# Patient Record
Sex: Female | Born: 1940
Health system: Southern US, Community
[De-identification: ages and names within clinical notes are randomized; demographics above are authoritative.]

## PROBLEM LIST (undated history)

## (undated) DIAGNOSIS — F0390 Unspecified dementia without behavioral disturbance: Secondary | ICD-10-CM

## (undated) DIAGNOSIS — I639 Cerebral infarction, unspecified: Secondary | ICD-10-CM

## (undated) DIAGNOSIS — I10 Essential (primary) hypertension: Secondary | ICD-10-CM

## (undated) DIAGNOSIS — M316 Other giant cell arteritis: Secondary | ICD-10-CM

## (undated) HISTORY — DX: Cerebral infarction, unspecified: I63.9

## (undated) HISTORY — PX: NO PAST SURGERIES: SHX2092

---

## 1999-04-25 ENCOUNTER — Encounter: Payer: Self-pay | Admitting: Family Medicine

## 1999-04-25 ENCOUNTER — Ambulatory Visit (HOSPITAL_COMMUNITY): Admission: RE | Admit: 1999-04-25 | Discharge: 1999-04-25 | Payer: Self-pay | Admitting: Family Medicine

## 2000-03-09 ENCOUNTER — Encounter: Payer: Self-pay | Admitting: Emergency Medicine

## 2000-03-09 ENCOUNTER — Emergency Department (HOSPITAL_COMMUNITY): Admission: EM | Admit: 2000-03-09 | Discharge: 2000-03-09 | Payer: Self-pay | Admitting: Emergency Medicine

## 2000-05-13 ENCOUNTER — Other Ambulatory Visit: Admission: RE | Admit: 2000-05-13 | Discharge: 2000-05-13 | Payer: Self-pay | Admitting: Family Medicine

## 2001-03-17 ENCOUNTER — Ambulatory Visit (HOSPITAL_COMMUNITY): Admission: RE | Admit: 2001-03-17 | Discharge: 2001-03-17 | Payer: Self-pay | Admitting: Family Medicine

## 2001-03-17 ENCOUNTER — Encounter: Payer: Self-pay | Admitting: Family Medicine

## 2001-04-26 ENCOUNTER — Ambulatory Visit (HOSPITAL_COMMUNITY): Admission: RE | Admit: 2001-04-26 | Discharge: 2001-04-26 | Payer: Self-pay | Admitting: Family Medicine

## 2001-04-26 ENCOUNTER — Encounter: Payer: Self-pay | Admitting: Family Medicine

## 2001-07-26 ENCOUNTER — Encounter: Payer: Self-pay | Admitting: Family Medicine

## 2001-07-26 ENCOUNTER — Ambulatory Visit (HOSPITAL_COMMUNITY): Admission: RE | Admit: 2001-07-26 | Discharge: 2001-07-26 | Payer: Self-pay | Admitting: Family Medicine

## 2001-09-15 ENCOUNTER — Ambulatory Visit (HOSPITAL_COMMUNITY): Admission: RE | Admit: 2001-09-15 | Discharge: 2001-09-15 | Payer: Self-pay | Admitting: Family Medicine

## 2001-09-15 ENCOUNTER — Encounter: Payer: Self-pay | Admitting: Family Medicine

## 2001-09-26 ENCOUNTER — Emergency Department (HOSPITAL_COMMUNITY): Admission: EM | Admit: 2001-09-26 | Discharge: 2001-09-27 | Payer: Self-pay | Admitting: Emergency Medicine

## 2001-10-10 ENCOUNTER — Emergency Department (HOSPITAL_COMMUNITY): Admission: EM | Admit: 2001-10-10 | Discharge: 2001-10-10 | Payer: Self-pay | Admitting: Emergency Medicine

## 2001-10-10 ENCOUNTER — Encounter: Payer: Self-pay | Admitting: Emergency Medicine

## 2001-10-17 ENCOUNTER — Emergency Department (HOSPITAL_COMMUNITY): Admission: EM | Admit: 2001-10-17 | Discharge: 2001-10-17 | Payer: Self-pay | Admitting: *Deleted

## 2002-02-02 ENCOUNTER — Ambulatory Visit (HOSPITAL_COMMUNITY): Admission: RE | Admit: 2002-02-02 | Discharge: 2002-02-02 | Payer: Self-pay | Admitting: Family Medicine

## 2002-02-02 ENCOUNTER — Encounter: Payer: Self-pay | Admitting: Family Medicine

## 2002-03-22 ENCOUNTER — Inpatient Hospital Stay (HOSPITAL_COMMUNITY): Admission: AD | Admit: 2002-03-22 | Discharge: 2002-03-22 | Payer: Self-pay | Admitting: Obstetrics and Gynecology

## 2002-03-31 ENCOUNTER — Ambulatory Visit (HOSPITAL_COMMUNITY): Admission: RE | Admit: 2002-03-31 | Discharge: 2002-03-31 | Payer: Self-pay | Admitting: Family Medicine

## 2002-03-31 ENCOUNTER — Encounter: Payer: Self-pay | Admitting: Family Medicine

## 2003-05-22 ENCOUNTER — Encounter (HOSPITAL_BASED_OUTPATIENT_CLINIC_OR_DEPARTMENT_OTHER): Admission: RE | Admit: 2003-05-22 | Discharge: 2003-08-20 | Payer: Self-pay | Admitting: Internal Medicine

## 2003-05-23 ENCOUNTER — Encounter: Payer: Self-pay | Admitting: Family Medicine

## 2003-05-23 ENCOUNTER — Ambulatory Visit (HOSPITAL_COMMUNITY): Admission: RE | Admit: 2003-05-23 | Discharge: 2003-05-23 | Payer: Self-pay | Admitting: Family Medicine

## 2003-10-23 ENCOUNTER — Emergency Department (HOSPITAL_COMMUNITY): Admission: EM | Admit: 2003-10-23 | Discharge: 2003-10-24 | Payer: Self-pay | Admitting: Family Medicine

## 2003-11-24 ENCOUNTER — Emergency Department (HOSPITAL_COMMUNITY): Admission: EM | Admit: 2003-11-24 | Discharge: 2003-11-24 | Payer: Self-pay | Admitting: Emergency Medicine

## 2004-01-24 ENCOUNTER — Other Ambulatory Visit: Admission: RE | Admit: 2004-01-24 | Discharge: 2004-01-24 | Payer: Self-pay | Admitting: Gynecology

## 2004-02-09 ENCOUNTER — Emergency Department (HOSPITAL_COMMUNITY): Admission: EM | Admit: 2004-02-09 | Discharge: 2004-02-09 | Payer: Self-pay | Admitting: Emergency Medicine

## 2004-05-31 ENCOUNTER — Emergency Department (HOSPITAL_COMMUNITY): Admission: EM | Admit: 2004-05-31 | Discharge: 2004-06-01 | Payer: Self-pay | Admitting: Emergency Medicine

## 2004-06-05 ENCOUNTER — Ambulatory Visit (HOSPITAL_COMMUNITY): Admission: RE | Admit: 2004-06-05 | Discharge: 2004-06-05 | Payer: Self-pay | Admitting: Anesthesiology

## 2004-07-01 ENCOUNTER — Emergency Department (HOSPITAL_COMMUNITY): Admission: EM | Admit: 2004-07-01 | Discharge: 2004-07-01 | Payer: Self-pay | Admitting: Emergency Medicine

## 2005-01-26 ENCOUNTER — Other Ambulatory Visit: Admission: RE | Admit: 2005-01-26 | Discharge: 2005-01-26 | Payer: Self-pay | Admitting: Gynecology

## 2005-09-05 ENCOUNTER — Emergency Department (HOSPITAL_COMMUNITY): Admission: EM | Admit: 2005-09-05 | Discharge: 2005-09-06 | Payer: Self-pay | Admitting: Emergency Medicine

## 2005-10-05 ENCOUNTER — Emergency Department (HOSPITAL_COMMUNITY): Admission: EM | Admit: 2005-10-05 | Discharge: 2005-10-05 | Payer: Self-pay | Admitting: Emergency Medicine

## 2006-01-07 ENCOUNTER — Ambulatory Visit (HOSPITAL_COMMUNITY): Admission: RE | Admit: 2006-01-07 | Discharge: 2006-01-07 | Payer: Self-pay | Admitting: *Deleted

## 2006-07-06 ENCOUNTER — Other Ambulatory Visit: Admission: RE | Admit: 2006-07-06 | Discharge: 2006-07-06 | Payer: Self-pay | Admitting: Gynecology

## 2006-07-13 ENCOUNTER — Encounter: Admission: RE | Admit: 2006-07-13 | Discharge: 2006-07-13 | Payer: Self-pay | Admitting: Endocrinology

## 2007-07-14 ENCOUNTER — Other Ambulatory Visit: Admission: RE | Admit: 2007-07-14 | Discharge: 2007-07-14 | Payer: Self-pay | Admitting: Gynecology

## 2007-11-13 ENCOUNTER — Emergency Department (HOSPITAL_COMMUNITY): Admission: EM | Admit: 2007-11-13 | Discharge: 2007-11-13 | Payer: Self-pay | Admitting: Emergency Medicine

## 2008-02-27 ENCOUNTER — Other Ambulatory Visit: Admission: RE | Admit: 2008-02-27 | Discharge: 2008-02-27 | Payer: Self-pay | Admitting: Endocrinology

## 2008-12-25 ENCOUNTER — Emergency Department (HOSPITAL_COMMUNITY): Admission: EM | Admit: 2008-12-25 | Discharge: 2008-12-25 | Payer: Self-pay | Admitting: Emergency Medicine

## 2009-03-04 ENCOUNTER — Emergency Department (HOSPITAL_COMMUNITY): Admission: EM | Admit: 2009-03-04 | Discharge: 2009-03-04 | Payer: Self-pay | Admitting: Emergency Medicine

## 2009-04-02 ENCOUNTER — Other Ambulatory Visit: Admission: RE | Admit: 2009-04-02 | Discharge: 2009-04-02 | Payer: Self-pay | Admitting: Endocrinology

## 2009-06-04 ENCOUNTER — Emergency Department (HOSPITAL_COMMUNITY): Admission: EM | Admit: 2009-06-04 | Discharge: 2009-06-04 | Payer: Self-pay | Admitting: Emergency Medicine

## 2009-07-14 ENCOUNTER — Emergency Department (HOSPITAL_COMMUNITY): Admission: EM | Admit: 2009-07-14 | Discharge: 2009-07-14 | Payer: Self-pay | Admitting: Emergency Medicine

## 2009-10-17 ENCOUNTER — Ambulatory Visit: Payer: Self-pay | Admitting: Gastroenterology

## 2009-11-25 ENCOUNTER — Encounter: Admission: RE | Admit: 2009-11-25 | Discharge: 2009-11-25 | Payer: Self-pay | Admitting: Gastroenterology

## 2010-01-14 ENCOUNTER — Emergency Department (HOSPITAL_COMMUNITY): Admission: EM | Admit: 2010-01-14 | Discharge: 2010-01-14 | Payer: Self-pay | Admitting: Emergency Medicine

## 2010-03-18 ENCOUNTER — Encounter: Admission: RE | Admit: 2010-03-18 | Discharge: 2010-03-18 | Payer: Self-pay | Admitting: Family Medicine

## 2010-07-10 ENCOUNTER — Ambulatory Visit: Payer: Self-pay | Admitting: Family Medicine

## 2010-07-10 ENCOUNTER — Encounter (INDEPENDENT_AMBULATORY_CARE_PROVIDER_SITE_OTHER): Payer: Self-pay | Admitting: Family Medicine

## 2010-07-10 LAB — CONVERTED CEMR LAB
Albumin: 3.6 g/dL (ref 3.5–5.2)
CO2: 28 meq/L (ref 19–32)
Cholesterol: 131 mg/dL (ref 0–200)
Eosinophils Relative: 1 % (ref 0–5)
Glucose, Bld: 83 mg/dL (ref 70–99)
HCT: 37 % (ref 36.0–46.0)
Lymphocytes Relative: 28 % (ref 12–46)
Lymphs Abs: 1.2 10*3/uL (ref 0.7–4.0)
Monocytes Relative: 14 % — ABNORMAL HIGH (ref 3–12)
Neutrophils Relative %: 57 % (ref 43–77)
Potassium: 3.8 meq/L (ref 3.5–5.3)
RBC: 4.07 M/uL (ref 3.87–5.11)
Sodium: 144 meq/L (ref 135–145)
Total Protein: 6.8 g/dL (ref 6.0–8.3)
Triglycerides: 49 mg/dL (ref <150)
WBC: 4.2 10*3/uL (ref 4.0–10.5)

## 2010-07-14 ENCOUNTER — Encounter
Admission: RE | Admit: 2010-07-14 | Discharge: 2010-07-14 | Payer: Self-pay | Source: Home / Self Care | Admitting: Internal Medicine

## 2010-07-16 ENCOUNTER — Ambulatory Visit: Payer: Self-pay | Admitting: Family Medicine

## 2010-07-24 ENCOUNTER — Ambulatory Visit: Payer: Self-pay | Admitting: Internal Medicine

## 2010-07-29 ENCOUNTER — Ambulatory Visit: Payer: Self-pay | Admitting: Family Medicine

## 2010-08-15 ENCOUNTER — Ambulatory Visit: Payer: Self-pay | Admitting: Internal Medicine

## 2010-11-26 ENCOUNTER — Observation Stay (HOSPITAL_COMMUNITY)
Admission: EM | Admit: 2010-11-26 | Discharge: 2010-11-27 | Payer: Self-pay | Source: Home / Self Care | Attending: Internal Medicine | Admitting: Internal Medicine

## 2011-02-16 LAB — DIFFERENTIAL
Eosinophils Absolute: 0 10*3/uL (ref 0.0–0.7)
Eosinophils Relative: 0 % (ref 0–5)
Lymphs Abs: 0.9 10*3/uL (ref 0.7–4.0)

## 2011-02-16 LAB — CBC
MCH: 29.6 pg (ref 26.0–34.0)
MCV: 90.9 fL (ref 78.0–100.0)
Platelets: 150 10*3/uL (ref 150–400)
Platelets: 161 10*3/uL (ref 150–400)
RBC: 4.06 MIL/uL (ref 3.87–5.11)
RDW: 14.9 % (ref 11.5–15.5)
WBC: 4.2 10*3/uL (ref 4.0–10.5)

## 2011-02-16 LAB — URINALYSIS, ROUTINE W REFLEX MICROSCOPIC
Nitrite: NEGATIVE
Specific Gravity, Urine: 1.013 (ref 1.005–1.030)
pH: 7.5 (ref 5.0–8.0)

## 2011-02-16 LAB — BASIC METABOLIC PANEL
BUN: 14 mg/dL (ref 6–23)
BUN: 15 mg/dL (ref 6–23)
CO2: 30 mEq/L (ref 19–32)
Calcium: 8.7 mg/dL (ref 8.4–10.5)
Chloride: 107 mEq/L (ref 96–112)
Creatinine, Ser: 0.61 mg/dL (ref 0.4–1.2)
Creatinine, Ser: 0.65 mg/dL (ref 0.4–1.2)
GFR calc Af Amer: >60 mL/min (ref >60)
GFR calc non Af Amer: >60 mL/min (ref >60)

## 2011-02-16 LAB — URINE CULTURE: Culture  Setup Time: 201112211452

## 2011-02-16 LAB — GLUCOSE, CAPILLARY
Glucose-Capillary: 108 mg/dL — ABNORMAL HIGH (ref 70–99)
Glucose-Capillary: 121 mg/dL — ABNORMAL HIGH (ref 70–99)
Glucose-Capillary: 150 mg/dL — ABNORMAL HIGH (ref 70–99)
Glucose-Capillary: 160 mg/dL — ABNORMAL HIGH (ref 70–99)
Glucose-Capillary: 88 mg/dL (ref 70–99)

## 2011-02-16 LAB — POCT CARDIAC MARKERS

## 2011-02-16 LAB — HEMOGLOBIN A1C: Mean Plasma Glucose: 146 mg/dL — ABNORMAL HIGH (ref <117)

## 2011-02-16 LAB — LIPID PANEL: Cholesterol: 150 mg/dL (ref 0–200)

## 2011-02-25 LAB — CBC
Hemoglobin: 12.8 g/dL (ref 12.0–15.0)
MCHC: 34.3 g/dL (ref 30.0–36.0)
MCV: 90.3 fL (ref 78.0–100.0)
RDW: 14.4 % (ref 11.5–15.5)

## 2011-02-25 LAB — COMPREHENSIVE METABOLIC PANEL
ALT: 22 U/L (ref 0–35)
CO2: 27 mEq/L (ref 19–32)
Calcium: 8.9 mg/dL (ref 8.4–10.5)
Creatinine, Ser: 0.61 mg/dL (ref 0.4–1.2)
GFR calc non Af Amer: >60 mL/min (ref >60)
Glucose, Bld: 114 mg/dL — ABNORMAL HIGH (ref 70–99)
Sodium: 139 mEq/L (ref 135–145)
Total Protein: 7.6 g/dL (ref 6.0–8.3)

## 2011-02-25 LAB — URINALYSIS, ROUTINE W REFLEX MICROSCOPIC
Bilirubin Urine: NEGATIVE
Hgb urine dipstick: NEGATIVE
Ketones, ur: NEGATIVE mg/dL
Nitrite: NEGATIVE
Urobilinogen, UA: 1 mg/dL (ref 0.0–1.0)
pH: 7 (ref 5.0–8.0)

## 2011-02-25 LAB — DIFFERENTIAL
Lymphocytes Relative: 16 % (ref 12–46)
Lymphs Abs: 0.9 10*3/uL (ref 0.7–4.0)
Monocytes Relative: 10 % (ref 3–12)
Neutro Abs: 4.3 10*3/uL (ref 1.7–7.7)
Neutrophils Relative %: 74 % (ref 43–77)

## 2011-03-14 LAB — BASIC METABOLIC PANEL
BUN: 11 mg/dL (ref 6–23)
CO2: 33 mEq/L — ABNORMAL HIGH (ref 19–32)
Calcium: 9 mg/dL (ref 8.4–10.5)
Chloride: 102 mEq/L (ref 96–112)
Creatinine, Ser: 0.78 mg/dL (ref 0.4–1.2)
GFR calc Af Amer: >60 mL/min (ref >60)
GFR calc non Af Amer: >60 mL/min (ref >60)
Glucose, Bld: 240 mg/dL — ABNORMAL HIGH (ref 70–99)
Potassium: 3.5 mEq/L (ref 3.5–5.1)
Sodium: 141 mEq/L (ref 135–145)

## 2011-03-14 LAB — GLUCOSE, CAPILLARY: Glucose-Capillary: 338 mg/dL — ABNORMAL HIGH (ref 70–99)

## 2011-03-16 LAB — GLUCOSE, CAPILLARY: Glucose-Capillary: 135 mg/dL — ABNORMAL HIGH (ref 70–99)

## 2011-03-19 LAB — GLUCOSE, CAPILLARY: Glucose-Capillary: 162 mg/dL — ABNORMAL HIGH (ref 70–99)

## 2011-03-23 LAB — CBC
HCT: 36.4 % (ref 36.0–46.0)
Hemoglobin: 12.2 g/dL (ref 12.0–15.0)
MCHC: 33.6 g/dL (ref 30.0–36.0)
MCV: 87.4 fL (ref 78.0–100.0)
Platelets: 186 10*3/uL (ref 150–400)
RDW: 14.6 % (ref 11.5–15.5)

## 2011-03-23 LAB — BASIC METABOLIC PANEL
BUN: 18 mg/dL (ref 6–23)
CO2: 28 mEq/L (ref 19–32)
Chloride: 101 mEq/L (ref 96–112)
Glucose, Bld: 121 mg/dL — ABNORMAL HIGH (ref 70–99)
Potassium: 3.9 mEq/L (ref 3.5–5.1)
Sodium: 138 mEq/L (ref 135–145)

## 2011-03-23 LAB — DIFFERENTIAL
Basophils Absolute: 0 10*3/uL (ref 0.0–0.1)
Eosinophils Absolute: 0.1 10*3/uL (ref 0.0–0.7)
Eosinophils Relative: 1 % (ref 0–5)
Monocytes Absolute: 0.9 10*3/uL (ref 0.1–1.0)

## 2011-03-23 LAB — SEDIMENTATION RATE: Sed Rate: 55 mm/hr — ABNORMAL HIGH (ref 0–22)

## 2011-04-24 NOTE — Consult Note (Signed)
NAME:  Diana Hartman, Diana Hartman                         ACCOUNT NO.:  [id number]   MEDICAL RECORD NO.:  [id number]                   PATIENT TYPE:  REC   LOCATION:  FOOT                                 FACILITY:  [facility]   PHYSICIAN:  Jonelle Sports. Sevier, M.D.              DATE OF BIRTH:  1941/05/18   DATE OF CONSULTATION:  05/24/2003  DATE OF DISCHARGE:                                   CONSULTATION   HISTORY:  This 70 year old black female is referred through the courtesy of  Dr. Alwyn Ren for management of hammertoe deformities with callus formation of  both feet.   The patient has been diagnosed with diabetes for some 20 years with last  hemoglobin A1C some five months ago of 9.5%.  There is some confusion as to  her present insulin dose as to whether it is 70/30 insulin as reflected in  the chart or whether it is simply Novolin NPH.  It is taken on a single  daily basis along with glipizide 20 mg per day.  The patient monitors very  infrequently, and her blood sugars are variable.   Although the patient does not have significant hallux valgus she has  developed a hammertoe deformity of her second toes bilaterally, such that  these toes now overlap the hallices and actually point down on the hallux.  As a result, she has developed calluses on the dorsal aspect of the proximal  interphalangeal joints of both second toes.  She also has a callus on the  plantar aspect of the right fourth toe.  She is referred now for our  evaluation and advice regarding these lesions.   PAST MEDICAL HISTORY:  Notable for:  1. Degenerative arthritis.  2. Degenerative disk disease.  3. Hypertension.  4. Anxiety.   ALLERGIES:  She has no known medicinal allergies.   MEDICATIONS:  Her regular medications include:  1. Insulin as previously indicated with some confusion.  2. Glipizide 20 mg p.o. each morning.  3. Amitriptyline 25 mg nightly at bedtime.  4. Enteric-coated aspirin 325 mg daily.  5. Prednisone 5  mg daily.  6. Zoloft 50 mg daily.  7. Covera 180 mg nightly at bedtime.  8. Prazosin 1 mg nightly at bedtime.  9. Flexeril 10 mg nightly at bedtime.  10.      Prinzide 10/12.5 1 daily.  11.      Zyrtec 10 mg daily.   EXAMINATION:  Examination today is limited to the distal lower extremities.  The patient has grossly deformed feet due to the striking hammertoe  configuration of the second toes bilaterally, with these then overlapping  the hallices.  Skin temperatures are high normal and equal in both feet.  All pulses are palpable and quite adequate.  There is hair growth on the  toes.  Monofilament testing shows the preservation of protective sensation  in both feet.  There is significant callus formation on  the dorsal aspect of  both second toes at the proximal interphalangeal joint area, and there is a  heavy callus formation of the plantar aspect of the fourth toe on the right.  There are lesser calluses at the interphalangeal joint area of both hallices  on their medial margins and light callus at both heels.   DISPOSITION:  1. The patient is given instruction regarding foot care and diabetes by     video with nurse and physician reinforcement.  2. It is advised to the patient that it is urgent that she bring her     diabetes under better control, and she is today given the recommendation     to make sure that she has 70/30 insulin and to take that 20 units daily     before her morning meal and 30 units nightly before the evening meal,     which is her largest meal of the day.  She is to continue her glipizide.     She is to contact Dr. Audria Nine for ongoing tightening of her diabetes     management.  3. The calluses at all of the aforementioned areas, namely both heels, the     plantar aspect of the right fourth toe, medially on both hallices, and on     the dorsal aspect of both second toes at their proximal interphalangeal     joint areas, are sharply pared without incident.   4. The patient is given the name of Scientist, product/process development on [employer] and is     advised to go there to obtain extra-depth shoes with standard non-custom     protected inserted.  5. Follow-up visit will be here in three months.                                               Jonelle Sports. Cheryll Cockayne, M.D.    RES/MEDQ  D:  05/24/2003  T:  05/24/2003  Job:  [id number]   cc:   Titus Dubin. Alwyn Ren, M.D. [facility]   Maurice March, M.D.  [address] [place]  [place] [address]  Fax: [phone]

## 2011-04-24 NOTE — Op Note (Signed)
Diana Hartman, Diana Hartman               ACCOUNT NO.:  [id number]   MEDICAL RECORD NO.:  [id number]          PATIENT TYPE:  AMB   LOCATION:  ENDO                         FACILITY:  [facility]   PHYSICIAN:  Georgiana Spinner, M.D.    DATE OF BIRTH:  July 28, 1941   DATE OF PROCEDURE:  DATE OF DISCHARGE:                                 OPERATIVE REPORT   PROCEDURE:  Colonoscopy.   INDICATION:  Hemoccult positivity.   ANESTHESIA:  Demerol 80 mg, Versed 8 mg.   DESCRIPTION OF PROCEDURE:  With the patient mildly sedated in the left  lateral decubitus position, the Olympus videoscopic colonoscope was inserted  in the rectum and passed carefully through a sigmoid area that may have been  in an abdominal wall hernia but with pressure applied and the patient rolled  to her back we were to reach the cecum identified by the ileocecal valve and  appendiceal orifice both of which were photographed.  From this point the  colonoscope was slowly withdrawn, taking circumferential views of the  colonic mucosa and stopping then only when we reached the rectum which  appeared normal on direct and showed hemorrhoids on retroflexed view.  The  endoscope was straightened and withdrawn.  The patient's vital signs and  pulse oximeter remained stable.  The patient tolerated the procedure well  without apparent complications.   FINDINGS:  Diverticulosis of sigmoid colon with probable abdominal wall  hernia sac and internal hemorrhoids were noted which may be the cause of the  patient's hemoccult positivity.   PLAN:  Have the patient follow up with me on an as-needed basis.           ______________________________  Georgiana Spinner, M.D.     GMO/MEDQ  D:  01/07/2006  T:  01/07/2006  Job:  [id number]

## 2011-09-14 LAB — CBC
Hemoglobin: 10.9 — ABNORMAL LOW
MCHC: 34.3
Platelets: 203
RDW: 14.6

## 2011-09-14 LAB — DIFFERENTIAL
Basophils Absolute: 0.1
Basophils Relative: 1
Eosinophils Relative: 1
Lymphocytes Relative: 22
Monocytes Absolute: 0.9
Neutro Abs: 4.2

## 2011-12-03 ENCOUNTER — Emergency Department (HOSPITAL_COMMUNITY)
Admission: EM | Admit: 2011-12-03 | Discharge: 2011-12-04 | Disposition: A | Discharge status: Home / Self Care | Payer: Medicare Other | Attending: Emergency Medicine | Admitting: Emergency Medicine

## 2011-12-03 ENCOUNTER — Encounter: Payer: Self-pay | Admitting: Emergency Medicine

## 2011-12-03 DIAGNOSIS — R609 Edema, unspecified: Secondary | ICD-10-CM

## 2011-12-03 DIAGNOSIS — IMO0002 Reserved for concepts with insufficient information to code with codable children: Secondary | ICD-10-CM | POA: Insufficient documentation

## 2011-12-03 DIAGNOSIS — E119 Type 2 diabetes mellitus without complications: Secondary | ICD-10-CM | POA: Insufficient documentation

## 2011-12-03 DIAGNOSIS — I10 Essential (primary) hypertension: Secondary | ICD-10-CM | POA: Insufficient documentation

## 2011-12-03 DIAGNOSIS — Z79899 Other long term (current) drug therapy: Secondary | ICD-10-CM | POA: Insufficient documentation

## 2011-12-03 HISTORY — DX: Essential (primary) hypertension: I10

## 2011-12-03 NOTE — ED Notes (Signed)
Pt alert, nad, presents to ED, c/o lower ext swelling and pain, onset was today per family, pt +3 pitting edema to lower ext, resp even unlabored, skin pwd, denies chest pain or sob

## 2011-12-04 ENCOUNTER — Other Ambulatory Visit: Payer: Self-pay

## 2011-12-04 ENCOUNTER — Emergency Department (HOSPITAL_COMMUNITY): Payer: Medicare Other

## 2011-12-04 LAB — POCT I-STAT TROPONIN I: Troponin i, poc: 0.01 ng/mL (ref 0.00–0.08)

## 2011-12-04 LAB — DIFFERENTIAL
Basophils Absolute: 0 10*3/uL (ref 0.0–0.1)
Basophils Relative: 0 % (ref 0–1)
Lymphocytes Relative: 22 % (ref 12–46)
Neutro Abs: 3.5 10*3/uL (ref 1.7–7.7)
Neutrophils Relative %: 63 % (ref 43–77)

## 2011-12-04 LAB — CBC
HCT: 33.7 % — ABNORMAL LOW (ref 36.0–46.0)
Hemoglobin: 11.4 g/dL — ABNORMAL LOW (ref 12.0–15.0)
MCHC: 33.8 g/dL (ref 30.0–36.0)
RDW: 14.2 % (ref 11.5–15.5)
WBC: 5.6 10*3/uL (ref 4.0–10.5)

## 2011-12-04 LAB — D-DIMER, QUANTITATIVE: D-Dimer, Quant: 0.44 ug/mL-FEU (ref 0.00–0.48)

## 2011-12-04 LAB — POCT I-STAT, CHEM 8
BUN: 11 mg/dL (ref 6–23)
Chloride: 104 mEq/L (ref 96–112)
HCT: 36 % (ref 36.0–46.0)
Potassium: 3.5 mEq/L (ref 3.5–5.1)

## 2011-12-04 NOTE — ED Provider Notes (Signed)
History     CSN: [id number]  Arrival date & time 12/03/11  2320   First MD Initiated Contact with Patient 12/04/11 0040      Chief Complaint  Patient presents with  . Leg Swelling  . Leg Pain    (Consider location/radiation/quality/duration/timing/severity/associated sxs/prior treatment) The history is provided by the patient and a relative. No language interpreter was used.  Patient presents with 24 hours of B lower extremity edema.  No long car trips or plane trips.  No redness, warmth or induration.  No trauma.  Symmetric.  No CP, no SOB no DOE.  Edema is pitting and below the level of the knees B.  No f/c/r.    Past Medical History  Diagnosis Date  . Diabetes mellitus   . Hypertension     History reviewed. No pertinent past surgical history.  No family history on file.  History  Substance Use Topics  . Smoking status: Not on file  . Smokeless tobacco: Not on file  . Alcohol Use:     OB History    Grav Para Term Preterm Abortions TAB SAB Ect Mult Living                  Review of Systems  Constitutional: Negative for activity change.  Eyes: Negative for discharge.  Respiratory: Negative for cough, chest tightness and shortness of breath.   Cardiovascular: Negative for chest pain.  Gastrointestinal: Negative for abdominal distention.  Genitourinary: Negative for difficulty urinating.  Musculoskeletal: Negative for arthralgias.  Neurological: Negative for dizziness.  Hematological: Negative.   Psychiatric/Behavioral: Negative.     Allergies  Review of patient's allergies indicates no known allergies.  Home Medications   Current Outpatient Rx  Name Route Sig Dispense Refill  . DILTIAZEM HCL ER 180 MG PO CP24 Oral Take 180 mg by mouth daily.      Marland Kitchen GLIPIZIDE ER 5 MG PO TB24 Oral Take 5 mg by mouth daily.      . INSULIN DETEMIR 100 UNIT/ML Steger SOLN Subcutaneous Inject 10 Units into the skin daily.     Marland Kitchen LISINOPRIL 40 MG PO TABS Oral Take 40 mg by mouth  daily.      Marland Kitchen PREDNISONE 10 MG PO TABS Oral Take 10 mg by mouth daily.      . SERTRALINE HCL 50 MG PO TABS Oral Take 50 mg by mouth daily.        BP 150/67  Pulse 65  Temp(Src) 98 F (36.7 C) (Oral)  Resp 16  Wt 140 lb (63.504 kg)  SpO2 100%  Physical Exam  Constitutional: She appears well-developed and well-nourished.  HENT:  Head: Normocephalic and atraumatic.  Eyes: EOM are normal. Pupils are equal, round, and reactive to light.  Neck: Normal range of motion.  Cardiovascular: Normal rate and regular rhythm.   Pulmonary/Chest: Effort normal and breath sounds normal. She has no wheezes. She has no rales.  Abdominal: Soft. Bowel sounds are normal.  Musculoskeletal: Normal range of motion. She exhibits edema.  Neurological: She is alert. She has normal reflexes.  Skin: Skin is warm and dry.  Psychiatric: Thought content normal.    ED Course  Procedures (including critical care time)  Labs Reviewed  CBC - Abnormal; Notable for the following:    RBC 3.83 (*)    Hemoglobin 11.4 (*)    HCT 33.7 (*)    Platelets 146 (*)    All other components within normal limits  DIFFERENTIAL - Abnormal; Notable for  the following:    Monocytes Relative 14 (*)    All other components within normal limits  POCT I-STAT, CHEM 8 - Abnormal; Notable for the following:    Glucose, Bld 331 (*)    All other components within normal limits  POCT I-STAT TROPONIN I  D-DIMER, QUANTITATIVE  I-STAT, CHEM 8  I-STAT TROPONIN I   Dg Chest 2 View  12/04/2011  *RADIOLOGY REPORT*  Clinical Data: Leg swelling  CHEST - 2 VIEW  Comparison: 11/26/2010  Findings: The heart size and mediastinal contours are within normal limits.  Both lungs are clear.  The visualized skeletal structures are unremarkable.  IMPRESSION: No active cardiopulmonary abnormalities.  Original Report Authenticated By: Rosealee Albee, M.D.     1. Peripheral edema       MDM   Date: 12/04/2011  Rate: 60  Rhythm: normal sinus  rhythm  QRS Axis: normal  Intervals: normal  ST/T Wave abnormalities: normal  Conduction Disutrbances:none  Narrative Interpretation:   Old EKG Reviewed: none available        Giovanni Biby K Patricia Perales-Rasch, MD 12/04/11 [id number]

## 2011-12-04 NOTE — ED Notes (Signed)
Patient discharged with family.

## 2011-12-04 NOTE — ED Notes (Signed)
Patient states some tingling noted in the right lower extremity.

## 2012-02-03 ENCOUNTER — Ambulatory Visit
Admission: RE | Admit: 2012-02-03 | Discharge: 2012-02-03 | Disposition: A | Discharge status: Home / Self Care | Payer: Medicare Other | Source: Ambulatory Visit | Attending: Endocrinology | Admitting: Endocrinology

## 2012-02-03 ENCOUNTER — Other Ambulatory Visit: Payer: Self-pay | Admitting: Endocrinology

## 2012-02-03 DIAGNOSIS — M542 Cervicalgia: Secondary | ICD-10-CM

## 2012-03-28 ENCOUNTER — Other Ambulatory Visit: Payer: Self-pay | Admitting: Diagnostic Neuroimaging

## 2012-03-28 DIAGNOSIS — R531 Weakness: Secondary | ICD-10-CM

## 2012-03-28 DIAGNOSIS — R413 Other amnesia: Secondary | ICD-10-CM

## 2012-04-18 ENCOUNTER — Ambulatory Visit
Admission: RE | Admit: 2012-04-18 | Discharge: 2012-04-18 | Disposition: A | Discharge status: Home / Self Care | Payer: Medicare Other | Source: Ambulatory Visit | Attending: Diagnostic Neuroimaging | Admitting: Diagnostic Neuroimaging

## 2012-04-18 DIAGNOSIS — R531 Weakness: Secondary | ICD-10-CM

## 2012-04-18 DIAGNOSIS — R413 Other amnesia: Secondary | ICD-10-CM

## 2014-03-29 ENCOUNTER — Encounter (HOSPITAL_COMMUNITY): Payer: Self-pay | Admitting: Emergency Medicine

## 2014-03-29 ENCOUNTER — Emergency Department (HOSPITAL_COMMUNITY)
Admission: EM | Admit: 2014-03-29 | Discharge: 2014-03-29 | Disposition: A | Discharge status: Home / Self Care | Payer: Medicare Other | Attending: Emergency Medicine | Admitting: Emergency Medicine

## 2014-03-29 DIAGNOSIS — Z794 Long term (current) use of insulin: Secondary | ICD-10-CM | POA: Insufficient documentation

## 2014-03-29 DIAGNOSIS — R5381 Other malaise: Secondary | ICD-10-CM | POA: Insufficient documentation

## 2014-03-29 DIAGNOSIS — I10 Essential (primary) hypertension: Secondary | ICD-10-CM | POA: Insufficient documentation

## 2014-03-29 DIAGNOSIS — IMO0002 Reserved for concepts with insufficient information to code with codable children: Secondary | ICD-10-CM | POA: Insufficient documentation

## 2014-03-29 DIAGNOSIS — R5383 Other fatigue: Secondary | ICD-10-CM

## 2014-03-29 DIAGNOSIS — Z79899 Other long term (current) drug therapy: Secondary | ICD-10-CM | POA: Insufficient documentation

## 2014-03-29 DIAGNOSIS — F0390 Unspecified dementia without behavioral disturbance: Secondary | ICD-10-CM | POA: Insufficient documentation

## 2014-03-29 DIAGNOSIS — R159 Full incontinence of feces: Secondary | ICD-10-CM

## 2014-03-29 DIAGNOSIS — E162 Hypoglycemia, unspecified: Secondary | ICD-10-CM

## 2014-03-29 DIAGNOSIS — E1169 Type 2 diabetes mellitus with other specified complication: Secondary | ICD-10-CM | POA: Insufficient documentation

## 2014-03-29 HISTORY — DX: Unspecified dementia, unspecified severity, without behavioral disturbance, psychotic disturbance, mood disturbance, and anxiety: F03.90

## 2014-03-29 HISTORY — DX: Other giant cell arteritis: M31.6

## 2014-03-29 LAB — CBG MONITORING, ED
GLUCOSE-CAPILLARY: 39 mg/dL — AB (ref 70–99)
GLUCOSE-CAPILLARY: 71 mg/dL (ref 70–99)
GLUCOSE-CAPILLARY: 123 mg/dL — AB (ref 70–99)
GLUCOSE-CAPILLARY: 84 mg/dL (ref 70–99)
GLUCOSE-CAPILLARY: 118 mg/dL — AB (ref 70–99)
Glucose-Capillary: 107 mg/dL — ABNORMAL HIGH (ref 70–99)

## 2014-03-29 LAB — COMPREHENSIVE METABOLIC PANEL
ALBUMIN: 3.3 g/dL — AB (ref 3.5–5.2)
ALT: 12 U/L (ref 0–35)
AST: 27 U/L (ref 0–37)
Alkaline Phosphatase: 103 U/L (ref 39–117)
BUN: 18 mg/dL (ref 6–23)
CALCIUM: 9.7 mg/dL (ref 8.4–10.5)
CO2: 24 mEq/L (ref 19–32)
Chloride: 102 mEq/L (ref 96–112)
Creatinine, Ser: 0.6 mg/dL (ref 0.50–1.10)
GFR calc non Af Amer: 88 mL/min — ABNORMAL LOW (ref >90)
GLUCOSE: 124 mg/dL — AB (ref 70–99)
Potassium: 3.2 mEq/L — ABNORMAL LOW (ref 3.7–5.3)
SODIUM: 140 meq/L (ref 137–147)
TOTAL PROTEIN: 7.5 g/dL (ref 6.0–8.3)
Total Bilirubin: 0.5 mg/dL (ref 0.3–1.2)

## 2014-03-29 LAB — CBC WITH DIFFERENTIAL/PLATELET
BASOS PCT: 0 % (ref 0–1)
Basophils Absolute: 0 10*3/uL (ref 0.0–0.1)
EOS ABS: 0 10*3/uL (ref 0.0–0.7)
EOS PCT: 0 % (ref 0–5)
HCT: 39.2 % (ref 36.0–46.0)
Hemoglobin: 12.8 g/dL (ref 12.0–15.0)
LYMPHS ABS: 1 10*3/uL (ref 0.7–4.0)
Lymphocytes Relative: 11 % — ABNORMAL LOW (ref 12–46)
MCH: 28.5 pg (ref 26.0–34.0)
MCHC: 32.7 g/dL (ref 30.0–36.0)
MCV: 87.3 fL (ref 78.0–100.0)
Monocytes Absolute: 1.1 10*3/uL — ABNORMAL HIGH (ref 0.1–1.0)
Monocytes Relative: 12 % (ref 3–12)
NEUTROS PCT: 75 % (ref 43–77)
Neutro Abs: 6.9 10*3/uL (ref 1.7–7.7)
PLATELETS: 141 10*3/uL — AB (ref 150–400)
RBC: 4.49 MIL/uL (ref 3.87–5.11)
RDW: 13.3 % (ref 11.5–15.5)
WBC: 9.2 10*3/uL (ref 4.0–10.5)

## 2014-03-29 MED ORDER — DEXTROSE 50 % IV SOLN
1.0000 | Freq: Once | INTRAVENOUS | Status: AC
  Administered 2014-03-29: 50 mL via INTRAVENOUS
  Filled 2014-03-29: qty 50

## 2014-03-29 MED ORDER — POTASSIUM CHLORIDE CRYS ER 20 MEQ PO TBCR
40.0000 meq | EXTENDED_RELEASE_TABLET | Freq: Once | ORAL | Status: DC
  Filled 2014-03-29: qty 2

## 2014-03-29 MED ORDER — DEXTROSE 50 % IV SOLN: 50.0000 mL | Freq: Once | INTRAVENOUS | Status: DC

## 2014-03-29 NOTE — ED Notes (Signed)
Pt had liquid stool which got into hat for urine collection.  Urine specimen unable to use. will have to try again later for another clean specimen.

## 2014-03-29 NOTE — ED Notes (Signed)
Bed: WA02 Expected date:  Expected time:  Means of arrival:  Comments: hypoglycemia

## 2014-03-29 NOTE — ED Provider Notes (Signed)
CSN: [id number]     Arrival date & time 03/29/14  1216 History   First MD Initiated Contact with Patient 03/29/14 1339     Chief Complaint  Patient presents with  . Hypoglycemia     (Consider location/radiation/quality/duration/timing/severity/associated sxs/prior Treatment) HPI Comments: Pt is a 73 y.o. female with Pmhx as above who presents with several episodes of fecal incontinence over past 2-3 weeks. She had an episode today, felt too weak to get up and EMS called. FSBG at the time 29. D50 given. Upon arrrival , 39.  D50, given and repeated at 123. Pt currently has no complaints, denies, fever, chills, n/v, d/a, urinary symptoms, back pain. She is at baseline mental status, no focal neuro findings.  Patient is a 73 y.o. female presenting with hypoglycemia. The history is provided by the patient and a relative. The history is limited by the condition of the patient. No language interpreter was used.  Hypoglycemia Initial blood sugar:  29 Blood sugar after intervention:  123 Severity:  Severe Onset quality:  Unable to specify Timing:  Unable to specify Progression:  Improving Chronicity:  Recurrent Diabetic status:  Controlled with insulin and controlled with oral medications Current diabetic therapy:  Insulin, metformin, glipizide Time since last antidiabetic medication: yesterday. Context: decreased oral intake (has not eaten today)   Relieved by:  IV glucose and eating Ineffective treatments:  None tried Associated symptoms: weakness   Associated symptoms: no shortness of breath, no sweats and no vomiting     Past Medical History  Diagnosis Date  . Diabetes mellitus   . Hypertension   . Temporal arteritis   . Dementia    History reviewed. No pertinent past surgical history. No family history on file. History  Substance Use Topics  . Smoking status: Never Smoker   . Smokeless tobacco: Not on file  . Alcohol Use: No   OB History   Grav Para Term Preterm Abortions  TAB SAB Ect Mult Living                 Review of Systems  Constitutional: Negative for fever, chills, diaphoresis, activity change, appetite change and fatigue.  HENT: Negative for congestion, facial swelling, rhinorrhea and sore throat.   Eyes: Negative for photophobia and discharge.  Respiratory: Negative for cough, chest tightness and shortness of breath.   Cardiovascular: Negative for chest pain, palpitations and leg swelling.  Gastrointestinal: Negative for nausea, vomiting, abdominal pain and diarrhea.  Endocrine: Negative for polydipsia and polyuria.  Genitourinary: Negative for dysuria, frequency, difficulty urinating and pelvic pain.  Musculoskeletal: Negative for arthralgias, back pain, neck pain and neck stiffness.  Skin: Negative for color change and wound.  Allergic/Immunologic: Negative for immunocompromised state.  Neurological: Positive for weakness. Negative for facial asymmetry, numbness and headaches.  Hematological: Does not bruise/bleed easily.  Psychiatric/Behavioral: Negative for confusion and agitation.      Allergies  Review of patient's allergies indicates no known allergies.  Home Medications   Prior to Admission medications   Medication Sig Start Date End Date Taking? Authorizing Provider  diltiazem (DILACOR XR) 180 MG 24 hr capsule Take 180 mg by mouth daily.     Yes Historical Provider, MD  donepezil (ARICEPT) 10 MG tablet Take 10 mg by mouth daily.   Yes Historical Provider, MD  glipiZIDE (GLUCOTROL XL) 5 MG 24 hr tablet Take 5 mg by mouth daily.     Yes Historical Provider, MD  insulin detemir (LEVEMIR) 100 UNIT/ML injection Inject 20 Units into  the skin 2 (two) times daily.    Yes Historical Provider, MD  lisinopril (PRINIVIL,ZESTRIL) 40 MG tablet Take 40 mg by mouth daily.     Yes Historical Provider, MD  metFORMIN (GLUCOPHAGE) 1000 MG tablet Take 1,000 mg by mouth daily with breakfast.   Yes Historical Provider, MD  nebivolol (BYSTOLIC) 5 MG  tablet Take 5 mg by mouth daily.   Yes Historical Provider, MD  prednisoLONE acetate (PRED FORTE) 1 % ophthalmic suspension Place 1 drop into both eyes 4 (four) times daily.   Yes Historical Provider, MD  predniSONE (DELTASONE) 10 MG tablet Take 10 mg by mouth daily.     Yes Historical Provider, MD  sertraline (ZOLOFT) 50 MG tablet Take 50 mg by mouth daily.     Yes Historical Provider, MD  zolpidem (AMBIEN) 5 MG tablet Take 5 mg by mouth at bedtime as needed for sleep.   Yes Historical Provider, MD   BP 160/75  Pulse 68  Temp(Src) 97.9 F (36.6 C) (Oral)  Resp 16  SpO2 98% Physical Exam  Constitutional: She is oriented to person, place, and time. She appears well-developed and well-nourished. No distress.  HENT:  Head: Normocephalic and atraumatic.  Mouth/Throat: No oropharyngeal exudate.  Eyes: Pupils are equal, round, and reactive to light.  Neck: Normal range of motion. Neck supple.  Cardiovascular: Normal rate, regular rhythm and normal heart sounds.  Exam reveals no gallop and no friction rub.   No murmur heard. Pulmonary/Chest: Effort normal and breath sounds normal. No respiratory distress. She has no wheezes. She has no rales.  Abdominal: Soft. Bowel sounds are normal. She exhibits no distension and no mass. There is no tenderness. There is no rebound and no guarding.  Musculoskeletal: Normal range of motion. She exhibits no edema and no tenderness.  Neurological: She is alert and oriented to person, place, and time. She has normal strength. No cranial nerve deficit or sensory deficit. She exhibits normal muscle tone. She displays a negative Romberg sign. GCS eye subscore is 4. GCS verbal subscore is 4. GCS motor subscore is 6.  Skin: Skin is warm and dry.  Psychiatric: She has a normal mood and affect.    ED Course  Procedures (including critical care time) Labs Review Labs Reviewed  CBC WITH DIFFERENTIAL - Abnormal; Notable for the following:    Platelets 141 (*)     Lymphocytes Relative 11 (*)    Monocytes Absolute 1.1 (*)    All other components within normal limits  COMPREHENSIVE METABOLIC PANEL - Abnormal; Notable for the following:    Potassium 3.2 (*)    Glucose, Bld 124 (*)    Albumin 3.3 (*)    GFR calc non Af Amer 88 (*)    All other components within normal limits  CBG MONITORING, ED - Abnormal; Notable for the following:    Glucose-Capillary 39 (*)    All other components within normal limits  CBG MONITORING, ED - Abnormal; Notable for the following:    Glucose-Capillary 123 (*)    All other components within normal limits  CBG MONITORING, ED - Abnormal; Notable for the following:    Glucose-Capillary 107 (*)    All other components within normal limits  URINALYSIS, ROUTINE W REFLEX MICROSCOPIC  CBG MONITORING, ED  CBG MONITORING, ED    Imaging Review No results found.   EKG Interpretation None      MDM   Final diagnoses:  Fecal incontinence  Hypoglycemia    Pt is a  73 y.o. female with Pmhx as above who presents with several episodes of fecal incontinence over past 2-3 weeks. She had an episode today, felt too weak to get up and EMS called. FSBG at the time 29. D50 given. Upon arrrival , 39.  D50, given and repeated at 123. Pt currently has no complaints, denies, fever, chills, n/v, d/a, urinary symptoms, back pain. She is at baseline mental status, no focal neuro findings.  CBC, CMP grossly unremarkable except for mild hypokalemia. Pt given mixed carb meal, and glu will be repeated in dept by Dr. Criss AlvineGoldston. If low, plan for admission for observation. I do not have clear cause for fecal incontinence but doubt acute infection or spinal cord pathology.         Shanna CiscoMegan E Docherty, MD 03/29/14 1640

## 2014-03-29 NOTE — Discharge Instructions (Signed)
Continue to check your sugar to make sure it is not dropping. Do not take any of your diabetic medicines tonight. Be sure to check your sugar first thing in the morning contact her primary doctor to get back on a schedule of your medicines.   Hypoglycemia (Low Blood Sugar) Hypoglycemia is when the glucose (sugar) in your blood is too low. Hypoglycemia can happen for many reasons. It can happen to people with or without diabetes. Hypoglycemia can develop quickly and can be a medical emergency.  CAUSES  Having hypoglycemia does not mean that you will develop diabetes. Different causes include:  Missed or delayed meals or not enough carbohydrates eaten.  Medication overdose. This could be by accident or deliberate. If by accident, your medication may need to be adjusted or changed.  Exercise or increased activity without adjustments in carbohydrates or medications.  A nerve disorder that affects body functions like your heart rate, blood pressure and digestion (autonomic neuropathy).  A condition where the stomach muscles do not function properly (gastroparesis). Therefore, medications may not absorb properly.  The inability to recognize the signs of hypoglycemia (hypoglycemic unawareness).  Absorption of insulin  may be altered.  Alcohol consumption.  Pregnancy/menstrual cycles/postpartum. This may be due to hormones.  Certain kinds of tumors. This is very rare. SYMPTOMS   Sweating.  Hunger.  Dizziness.  Blurred vision.  Drowsiness.  Weakness.  Headache.  Rapid heart beat.  Shakiness.  Nervousness. DIAGNOSIS  Diagnosis is made by monitoring blood glucose in one or all of the following ways:  Fingerstick blood glucose monitoring.  Laboratory results. TREATMENT  If you think your blood glucose is low:  Check your blood glucose, if possible. If it is less than 70 mg/dl, take one of the following:  3-4 glucose tablets.   cup juice (prefer clear like  apple).   cup "regular" soda pop.  1 cup milk.  -1 tube of glucose gel.  5-6 hard candies.  Do not over treat because your blood glucose (sugar) will only go too high.  Wait 15 minutes and recheck your blood glucose. If it is still less than 70 mg/dl (or below your target range), repeat treatment.  Eat a snack if it is more than one hour until your next meal. Sometimes, your blood glucose may go so low that you are unable to treat yourself. You may need someone to help you. You may even pass out or be unable to swallow. This may require you to get an injection of glucagon, which raises the blood glucose. HOME CARE INSTRUCTIONS  Check blood glucose as recommended by your caregiver.  Take medication as prescribed by your caregiver.  Follow your meal plan. Do not skip meals. Eat on time.  If you are going to drink alcohol, drink it only with meals.  Check your blood glucose before driving.  Check your blood glucose before and after exercise. If you exercise longer or different than usual, be sure to check blood glucose more frequently.  Always carry treatment with you. Glucose tablets are the easiest to carry.  Always wear medical alert jewelry or carry some form of identification that states that you have diabetes. This will alert people that you have diabetes. If you have hypoglycemia, they will have a better idea on what to do. SEEK MEDICAL CARE IF:   You are having problems keeping your blood sugar at target range.  You are having frequent episodes of hypoglycemia.  You feel you might be having side effects  from your medicines.  You have symptoms of an illness that is not improving after 3-4 days.  You notice a change in vision or a new problem with your vision. SEEK IMMEDIATE MEDICAL CARE IF:   You are a family member or friend of a person whose blood glucose goes below 70 mg/dl and is accompanied by:  Confusion.  A change in mental status.  The inability to  swallow.  Passing out. Document Released: 11/23/2005 Document Revised: 02/15/2012 Document Reviewed: 03/21/2012 [facility] Patient Information 2014 [place], [place].    Fecal Incontinence Fecal incontinence is the inability to control your bowels. When you feel the urge to have a bowel movement, you may not be able to wait until you can get to a restroom. Stool may leak from the rectum unexpectedly. CAUSES   Constipation.  Damage to the nerves of the anal sphincter muscles or the rectum.  Diarrhea.  Damage to the anal sphincter muscles.  Loss of storage capacity in the rectum.  Pelvic floor dysfunction. DIAGNOSIS  Your caregiver will ask questions, do a physical exam, and possibly order other tests. These tests may include:  Anal manometry. This checks the anal sphincter tightness and its ability to respond to signals, as well as the sensitivity and function of the rectum.  Anorectal ultrasonography. This test evaluates the structure of the anal sphincters.  Proctography (also called defecography). This test shows how much stool the rectum can hold, how well the rectum holds it, and how well the rectum can get rid of the stool.  Proctosigmoidoscopy. This test allows caregivers to look inside the rectum for signs of disease or other problems that could cause fecal incontinence, such as inflammation, tumors, or scar tissue.  Anal electromyography. This tests for nerve damage. TREATMENT  Treatment depends on the cause and severity of fecal incontinence. It may include dietary changes, medicine, bowel training, or surgery. More than one treatment may be necessary for successful control. Treatment may include:  Adjusting what and how you eat.  Medicine to help you develop a more regular bowel pattern. Medicine may also be prescribed for diarrhea.  Bowel training to help you learn how to control your bowels (biofeedback). In some cases, it involves strengthening muscles. In others,  it means training the bowels to empty at a specific time of day.  Surgery to repair damaged areas or to replace the anal muscle.  A colostomy if other treatments fail. This involves removing a portion of the bowel. The remaining part is then attached to either the anus, or to a hole in the abdomen (stoma) through which stool leaves the body and is collected in a pouch. HOME CARE INSTRUCTIONS   Eat high fiber foods only if directed by your caregiver. Fiber adds bulk and makes stool easier to control. Oppositely, high fiber foods can act as a laxative and can make the problem worse.  Keep a food diary. List what you eat, how much you eat, and when you have an incontinent episode. After a few days, you may begin to see a pattern involving certain foods and incontinence. After you identify foods that seem to cause problems, cut back on them and see whether incontinence improves.  Avoid the following foods and drinks that often cause diarrhea:  Caffeine.  Spicy foods.  Fatty and greasy foods.  Dairy products (milk, cheese, and ice cream).  Cured or smoked meat like sausage, ham, or [place].  Alcohol.  Fruits like apples, peaches, or pears.  Ask your doctor  if you need a vitamin supplement.  Drink enough water and fluids to keep your urine clear or pale yellow.  Wear cotton underwear and loose clothes that "breathe." Tight clothes that block air can worsen anal problems. Change soiled underwear as soon as possible.  Wash the anal area with water, not soap, after each bowel movement to help with anal discomfort. Use pre-moistened, alcohol-free wipes. Try using non-medicated talcum powder or corn starch to relieve anal discomfort.  Your caregiver may recommend an appropriate cream or ointment that can help prevent skin irritation from direct contact with stool.  Talk to your caregiver if you are having emotional distress. TIPS  Take a bag containing cleanup supplies and a change of  clothing with you everywhere.  Locate public restrooms before you need them so you know where to go.  Use the toilet before heading out.  Wear disposable undergarments or sanitary pads if you think an episode is likely.  Use oral fecal deodorants to add to your comfort level if episodes are frequent. FOR MORE INFORMATION  American Academy [employer]: www.https://powers.com/aafp.org [employer]: www.iffgd.org Document Released: 11/04/2004 Document Revised: 02/15/2012 Document Reviewed: 03/31/2010 [facility] Patient Information 2014 [place], [place].

## 2014-03-29 NOTE — ED Notes (Signed)
Pt from home. cbg 29 on ems arrival to home. Gave 1/2 half amp D50. cbg after 93. 20g L AC IV. BP 172/80, p64, O2 sat 99%. Pt  A&O currently. Hx dementia.

## 2014-03-29 NOTE — ED Notes (Signed)
Pt given turkey sandwich and coke 

## 2014-03-29 NOTE — ED Provider Notes (Signed)
Care transferred to me. The patient has remained stable with her glucose. This is likely due to poor intake this morning. I discussed this with her son at the bedside, they will hold her diabetic medications tonight and recheck her sugars throughout the night and tomorrow morning. They will call her PCP to get back on the meds. They will return if symptoms return or worsen.  Audree CamelScott T Kashawna Manzer, MD 03/29/14 [phone]

## 2015-03-14 DIAGNOSIS — R109 Unspecified abdominal pain: Secondary | ICD-10-CM | POA: Diagnosis not present

## 2015-03-14 DIAGNOSIS — N390 Urinary tract infection, site not specified: Secondary | ICD-10-CM | POA: Diagnosis not present

## 2015-03-14 DIAGNOSIS — I10 Essential (primary) hypertension: Secondary | ICD-10-CM | POA: Diagnosis not present

## 2015-03-14 DIAGNOSIS — E118 Type 2 diabetes mellitus with unspecified complications: Secondary | ICD-10-CM | POA: Diagnosis not present

## 2015-04-30 DIAGNOSIS — Z0000 Encounter for general adult medical examination without abnormal findings: Secondary | ICD-10-CM | POA: Diagnosis not present

## 2015-04-30 DIAGNOSIS — E118 Type 2 diabetes mellitus with unspecified complications: Secondary | ICD-10-CM | POA: Diagnosis not present

## 2015-04-30 DIAGNOSIS — N390 Urinary tract infection, site not specified: Secondary | ICD-10-CM | POA: Diagnosis not present

## 2015-05-07 DIAGNOSIS — I10 Essential (primary) hypertension: Secondary | ICD-10-CM | POA: Diagnosis not present

## 2015-05-07 DIAGNOSIS — M316 Other giant cell arteritis: Secondary | ICD-10-CM | POA: Diagnosis not present

## 2015-05-07 DIAGNOSIS — E118 Type 2 diabetes mellitus with unspecified complications: Secondary | ICD-10-CM | POA: Diagnosis not present

## 2015-05-07 DIAGNOSIS — N390 Urinary tract infection, site not specified: Secondary | ICD-10-CM | POA: Diagnosis not present

## 2015-05-09 DIAGNOSIS — N390 Urinary tract infection, site not specified: Secondary | ICD-10-CM | POA: Diagnosis not present

## 2015-05-15 DIAGNOSIS — E161 Other hypoglycemia: Secondary | ICD-10-CM | POA: Diagnosis not present

## 2015-07-04 ENCOUNTER — Observation Stay (HOSPITAL_COMMUNITY)
Admission: EM | Admit: 2015-07-04 | Discharge: 2015-07-06 | Disposition: A | Discharge status: Home / Self Care | Payer: Medicare Other | Attending: Internal Medicine | Admitting: Internal Medicine

## 2015-07-04 ENCOUNTER — Emergency Department (HOSPITAL_COMMUNITY): Payer: Medicare Other

## 2015-07-04 ENCOUNTER — Encounter (HOSPITAL_COMMUNITY): Payer: Self-pay | Admitting: *Deleted

## 2015-07-04 DIAGNOSIS — S299XXA Unspecified injury of thorax, initial encounter: Secondary | ICD-10-CM | POA: Diagnosis not present

## 2015-07-04 DIAGNOSIS — E119 Type 2 diabetes mellitus without complications: Secondary | ICD-10-CM

## 2015-07-04 DIAGNOSIS — S199XXA Unspecified injury of neck, initial encounter: Secondary | ICD-10-CM | POA: Diagnosis not present

## 2015-07-04 DIAGNOSIS — E161 Other hypoglycemia: Secondary | ICD-10-CM | POA: Diagnosis not present

## 2015-07-04 DIAGNOSIS — S0990XA Unspecified injury of head, initial encounter: Secondary | ICD-10-CM | POA: Diagnosis not present

## 2015-07-04 DIAGNOSIS — W19XXXA Unspecified fall, initial encounter: Secondary | ICD-10-CM | POA: Insufficient documentation

## 2015-07-04 DIAGNOSIS — I10 Essential (primary) hypertension: Secondary | ICD-10-CM | POA: Insufficient documentation

## 2015-07-04 DIAGNOSIS — Z794 Long term (current) use of insulin: Secondary | ICD-10-CM | POA: Diagnosis not present

## 2015-07-04 DIAGNOSIS — F329 Major depressive disorder, single episode, unspecified: Secondary | ICD-10-CM | POA: Insufficient documentation

## 2015-07-04 DIAGNOSIS — E11649 Type 2 diabetes mellitus with hypoglycemia without coma: Principal | ICD-10-CM | POA: Insufficient documentation

## 2015-07-04 DIAGNOSIS — R4182 Altered mental status, unspecified: Secondary | ICD-10-CM | POA: Insufficient documentation

## 2015-07-04 DIAGNOSIS — E118 Type 2 diabetes mellitus with unspecified complications: Secondary | ICD-10-CM | POA: Diagnosis not present

## 2015-07-04 DIAGNOSIS — E162 Hypoglycemia, unspecified: Secondary | ICD-10-CM | POA: Diagnosis present

## 2015-07-04 DIAGNOSIS — F0390 Unspecified dementia without behavioral disturbance: Secondary | ICD-10-CM | POA: Insufficient documentation

## 2015-07-04 DIAGNOSIS — R7309 Other abnormal glucose: Secondary | ICD-10-CM | POA: Diagnosis not present

## 2015-07-04 LAB — COMPREHENSIVE METABOLIC PANEL
ALBUMIN: 3.5 g/dL (ref 3.5–5.0)
ALK PHOS: 100 U/L (ref 38–126)
ALT: 19 U/L (ref 14–54)
AST: 34 U/L (ref 15–41)
Anion gap: 6 (ref 5–15)
BILIRUBIN TOTAL: 0.7 mg/dL (ref 0.3–1.2)
BUN: 7 mg/dL (ref 6–20)
CALCIUM: 9.3 mg/dL (ref 8.9–10.3)
CHLORIDE: 109 mmol/L (ref 101–111)
CO2: 30 mmol/L (ref 22–32)
Creatinine, Ser: 0.53 mg/dL (ref 0.44–1.00)
GFR calc non Af Amer: >60 mL/min (ref >60)
Glucose, Bld: 43 mg/dL — CL (ref 65–99)
Potassium: 3.1 mmol/L — ABNORMAL LOW (ref 3.5–5.1)
SODIUM: 145 mmol/L (ref 135–145)
TOTAL PROTEIN: 7.6 g/dL (ref 6.5–8.1)

## 2015-07-04 LAB — URINALYSIS, ROUTINE W REFLEX MICROSCOPIC
BILIRUBIN URINE: NEGATIVE
GLUCOSE, UA: 100 mg/dL — AB
Hgb urine dipstick: NEGATIVE
Ketones, ur: NEGATIVE mg/dL
LEUKOCYTES UA: NEGATIVE
NITRITE: NEGATIVE
Protein, ur: NEGATIVE mg/dL
Specific Gravity, Urine: 1.008 (ref 1.005–1.030)
UROBILINOGEN UA: 1 mg/dL (ref 0.0–1.0)
pH: 7 (ref 5.0–8.0)

## 2015-07-04 LAB — CBC
HEMATOCRIT: 37.6 % (ref 36.0–46.0)
HEMOGLOBIN: 12.7 g/dL (ref 12.0–15.0)
MCH: 29.5 pg (ref 26.0–34.0)
MCHC: 33.8 g/dL (ref 30.0–36.0)
MCV: 87.2 fL (ref 78.0–100.0)
Platelets: 156 10*3/uL (ref 150–400)
RBC: 4.31 MIL/uL (ref 3.87–5.11)
RDW: 13.9 % (ref 11.5–15.5)
WBC: 5.8 10*3/uL (ref 4.0–10.5)

## 2015-07-04 LAB — CBC WITH DIFFERENTIAL/PLATELET
BASOS ABS: 0 10*3/uL (ref 0.0–0.1)
BASOS PCT: 0 % (ref 0–1)
Eosinophils Absolute: 0 10*3/uL (ref 0.0–0.7)
Eosinophils Relative: 0 % (ref 0–5)
HCT: 41.3 % (ref 36.0–46.0)
Hemoglobin: 14 g/dL (ref 12.0–15.0)
Lymphocytes Relative: 28 % (ref 12–46)
Lymphs Abs: 2.3 10*3/uL (ref 0.7–4.0)
MCH: 30 pg (ref 26.0–34.0)
MCHC: 33.9 g/dL (ref 30.0–36.0)
MCV: 88.6 fL (ref 78.0–100.0)
MONO ABS: 1 10*3/uL (ref 0.1–1.0)
MONOS PCT: 13 % — AB (ref 3–12)
NEUTROS ABS: 4.8 10*3/uL (ref 1.7–7.7)
NEUTROS PCT: 59 % (ref 43–77)
Platelets: 153 10*3/uL (ref 150–400)
RBC: 4.66 MIL/uL (ref 3.87–5.11)
RDW: 14.1 % (ref 11.5–15.5)
WBC: 8.2 10*3/uL (ref 4.0–10.5)

## 2015-07-04 LAB — GLUCOSE, CAPILLARY
GLUCOSE-CAPILLARY: 293 mg/dL — AB (ref 65–99)
Glucose-Capillary: 166 mg/dL — ABNORMAL HIGH (ref 65–99)
Glucose-Capillary: 239 mg/dL — ABNORMAL HIGH (ref 65–99)

## 2015-07-04 LAB — CREATININE, SERUM
CREATININE: 0.7 mg/dL (ref 0.44–1.00)
GFR calc Af Amer: >60 mL/min (ref >60)
GFR calc non Af Amer: >60 mL/min (ref >60)

## 2015-07-04 LAB — CBG MONITORING, ED
GLUCOSE-CAPILLARY: 148 mg/dL — AB (ref 65–99)
Glucose-Capillary: >600 mg/dL (ref 65–99)

## 2015-07-04 MED ORDER — DILTIAZEM HCL ER 240 MG PO CP24
240.0000 mg | ORAL_CAPSULE | Freq: Every day | ORAL | Status: DC
  Administered 2015-07-05 – 2015-07-06 (×2): 240 mg via ORAL
  Filled 2015-07-04 (×2): qty 1

## 2015-07-04 MED ORDER — SODIUM CHLORIDE 0.9 % IJ SOLN
3.0000 mL | Freq: Two times a day (BID) | INTRAMUSCULAR | Status: DC
  Administered 2015-07-04 – 2015-07-05 (×2): 3 mL via INTRAVENOUS

## 2015-07-04 MED ORDER — SODIUM CHLORIDE 0.9 % IV BOLUS (SEPSIS)
1000.0000 mL | Freq: Once | INTRAVENOUS | Status: AC
  Administered 2015-07-04: 1000 mL via INTRAVENOUS

## 2015-07-04 MED ORDER — ONDANSETRON HCL 4 MG PO TABS: 4.0000 mg | ORAL_TABLET | Freq: Four times a day (QID) | ORAL | Status: DC | PRN

## 2015-07-04 MED ORDER — ACETAMINOPHEN 325 MG PO TABS: 650.0000 mg | ORAL_TABLET | Freq: Four times a day (QID) | ORAL | Status: DC | PRN

## 2015-07-04 MED ORDER — INSULIN ASPART 100 UNIT/ML ~~LOC~~ SOLN
0.0000 [IU] | Freq: Three times a day (TID) | SUBCUTANEOUS | Status: DC
  Administered 2015-07-04: 2 [IU] via SUBCUTANEOUS
  Administered 2015-07-05: 3 [IU] via SUBCUTANEOUS
  Administered 2015-07-05: 9 [IU] via SUBCUTANEOUS
  Administered 2015-07-05: 1 [IU] via SUBCUTANEOUS
  Administered 2015-07-06: 2 [IU] via SUBCUTANEOUS

## 2015-07-04 MED ORDER — SERTRALINE HCL 50 MG PO TABS
50.0000 mg | ORAL_TABLET | Freq: Every day | ORAL | Status: DC
  Administered 2015-07-05 – 2015-07-06 (×2): 50 mg via ORAL
  Filled 2015-07-04 (×2): qty 1

## 2015-07-04 MED ORDER — DONEPEZIL HCL 10 MG PO TABS
10.0000 mg | ORAL_TABLET | Freq: Every day | ORAL | Status: DC
  Administered 2015-07-05 – 2015-07-06 (×2): 10 mg via ORAL
  Filled 2015-07-04 (×2): qty 1

## 2015-07-04 MED ORDER — SODIUM CHLORIDE 0.9 % IV SOLN
250.0000 mL | INTRAVENOUS | Status: DC | PRN
  Administered 2015-07-05: 250 mL via INTRAVENOUS

## 2015-07-04 MED ORDER — LABETALOL HCL 5 MG/ML IV SOLN
10.0000 mg | Freq: Once | INTRAVENOUS | Status: AC
  Administered 2015-07-04: 10 mg via INTRAVENOUS
  Filled 2015-07-04: qty 4

## 2015-07-04 MED ORDER — DEXTROSE 50 % IV SOLN
50.0000 mL | Freq: Once | INTRAVENOUS | Status: AC
  Administered 2015-07-04: 50 mL via INTRAVENOUS
  Filled 2015-07-04: qty 50

## 2015-07-04 MED ORDER — ENOXAPARIN SODIUM 40 MG/0.4ML ~~LOC~~ SOLN
40.0000 mg | SUBCUTANEOUS | Status: DC
  Administered 2015-07-04 – 2015-07-05 (×2): 40 mg via SUBCUTANEOUS
  Filled 2015-07-04 (×2): qty 0.4

## 2015-07-04 MED ORDER — LISINOPRIL 40 MG PO TABS
40.0000 mg | ORAL_TABLET | Freq: Every day | ORAL | Status: DC
  Administered 2015-07-05 – 2015-07-06 (×2): 40 mg via ORAL
  Filled 2015-07-04 (×2): qty 1

## 2015-07-04 MED ORDER — HYDRALAZINE HCL 20 MG/ML IJ SOLN: 10.0000 mg | Freq: Four times a day (QID) | INTRAMUSCULAR | Status: DC | PRN

## 2015-07-04 MED ORDER — PREDNISONE 1 MG PO TABS
9.0000 mg | ORAL_TABLET | Freq: Every day | ORAL | Status: DC
  Administered 2015-07-05 – 2015-07-06 (×2): 9 mg via ORAL
  Filled 2015-07-04 (×2): qty 9

## 2015-07-04 MED ORDER — PREDNISOLONE ACETATE 1 % OP SUSP
1.0000 [drp] | Freq: Four times a day (QID) | OPHTHALMIC | Status: DC
  Administered 2015-07-04 – 2015-07-06 (×6): 1 [drp] via OPHTHALMIC
  Filled 2015-07-04 (×2): qty 5

## 2015-07-04 MED ORDER — ZOLPIDEM TARTRATE 5 MG PO TABS
5.0000 mg | ORAL_TABLET | Freq: Every evening | ORAL | Status: DC | PRN
  Administered 2015-07-04: 5 mg via ORAL
  Filled 2015-07-04: qty 1

## 2015-07-04 MED ORDER — SODIUM CHLORIDE 0.9 % IJ SOLN: 3.0000 mL | INTRAMUSCULAR | Status: DC | PRN

## 2015-07-04 MED ORDER — ONDANSETRON HCL 4 MG/2ML IJ SOLN: 4.0000 mg | Freq: Four times a day (QID) | INTRAMUSCULAR | Status: DC | PRN

## 2015-07-04 MED ORDER — NEBIVOLOL HCL 10 MG PO TABS
5.0000 mg | ORAL_TABLET | Freq: Every day | ORAL | Status: DC
  Administered 2015-07-05 – 2015-07-06 (×2): 5 mg via ORAL
  Filled 2015-07-04 (×3): qty 1

## 2015-07-04 MED ORDER — ACETAMINOPHEN 650 MG RE SUPP: 650.0000 mg | Freq: Four times a day (QID) | RECTAL | Status: DC | PRN

## 2015-07-04 MED ORDER — KCL IN DEXTROSE-NACL 20-5-0.45 MEQ/L-%-% IV SOLN
Freq: Once | INTRAVENOUS | Status: AC
  Administered 2015-07-04: 15:00:00 via INTRAVENOUS
  Filled 2015-07-04: qty 1000

## 2015-07-04 NOTE — ED Notes (Signed)
Phlebotomy called and informed pt has returned.

## 2015-07-04 NOTE — ED Notes (Signed)
Pt arrives from home via [facility]. Pt had a fall this morning without any injury. Pt was incontinent of feces after fall and family stated the pt blood sugar was low. CBG on scene was 42. En route the pt became altered and was unresponsive. CBG rechecked and was 42. Pt given an amp of D50. Pt CBG is now 344.

## 2015-07-04 NOTE — ED Notes (Signed)
Patient transported to X-ray 

## 2015-07-04 NOTE — Progress Notes (Signed)
Report received from AD [facility]. Patient present on floor .

## 2015-07-04 NOTE — ED Notes (Signed)
Attempted report to 6N again. Requested to give report to charge RN.

## 2015-07-04 NOTE — ED Notes (Signed)
Attempted report to [facility]N 

## 2015-07-04 NOTE — ED Provider Notes (Signed)
CSN: [id number]     Arrival date & time 07/04/15  4098 History   First MD Initiated Contact with Patient 07/04/15 1011     Chief Complaint  Patient presents with  . Fall  . Hypoglycemia     (Consider location/radiation/quality/duration/timing/severity/associated sxs/prior Treatment) HPI   74 year old female history of diabetes, hypertension, dementia presents to the emergency department for abnormal behavior. Patient not able supply history secondary to dementia nurses notes and the son that she lives with supply the history. Patient was normal state of health he hurt her yell out to into her bedroom and she had fallen against the bed. He thought her sugar might be low so gave her 2 Amps of glucose and called his sister. While speaking with sister, patient got up and walked down the hallway and fell again hitting her head on the wall. No loss of consciousness. EMS was called EMS report was that she had feces on her but per the daughter this is baseline for her. EMS her glucose was 42 so they gave another amp of D50 and brought her here.  Past Medical History  Diagnosis Date  . Diabetes mellitus   . Hypertension   . Temporal arteritis   . Dementia    History reviewed. No pertinent past surgical history. History reviewed. No pertinent family history. History  Substance Use Topics  . Smoking status: Never Smoker   . Smokeless tobacco: Not on file  . Alcohol Use: No   OB History    No data available     Review of Systems  Unable to perform ROS: Dementia      Allergies  Review of patient's allergies indicates no known allergies.  Home Medications   Prior to Admission medications   Medication Sig Start Date End Date Taking? Authorizing Provider  diltiazem (DILACOR XR) 180 MG 24 hr capsule Take 180 mg by mouth daily.      Historical Provider, MD  donepezil (ARICEPT) 10 MG tablet Take 10 mg by mouth daily.    Historical Provider, MD  glipiZIDE (GLUCOTROL XL) 5 MG 24 hr tablet  Take 5 mg by mouth daily.      Historical Provider, MD  insulin detemir (LEVEMIR) 100 UNIT/ML injection Inject 20 Units into the skin 2 (two) times daily.     Historical Provider, MD  lisinopril (PRINIVIL,ZESTRIL) 40 MG tablet Take 40 mg by mouth daily.      Historical Provider, MD  metFORMIN (GLUCOPHAGE) 1000 MG tablet Take 1,000 mg by mouth daily with breakfast.    Historical Provider, MD  nebivolol (BYSTOLIC) 5 MG tablet Take 5 mg by mouth daily.    Historical Provider, MD  prednisoLONE acetate (PRED FORTE) 1 % ophthalmic suspension Place 1 drop into both eyes 4 (four) times daily.    Historical Provider, MD  predniSONE (DELTASONE) 10 MG tablet Take 10 mg by mouth daily.      Historical Provider, MD  sertraline (ZOLOFT) 50 MG tablet Take 50 mg by mouth daily.      Historical Provider, MD  zolpidem (AMBIEN) 5 MG tablet Take 5 mg by mouth at bedtime as needed for sleep.    Historical Provider, MD   BP 182/95 mmHg  Pulse 64  Resp 14  SpO2 99% Physical Exam  Constitutional: She appears well-developed and well-nourished.  HENT:  Head: Normocephalic and atraumatic.  Eyes: Conjunctivae and EOM are normal. Pupils are equal, round, and reactive to light.  Cardiovascular: Normal rate and regular rhythm.   Pulmonary/Chest:  Effort normal and breath sounds normal. No respiratory distress.  Abdominal: Soft. She exhibits no distension. There is no tenderness.  Musculoskeletal: Normal range of motion. She exhibits no edema or tenderness.  Neurological: She is alert.  Skin: Skin is warm and dry.  Nursing note and vitals reviewed.   ED Course  Procedures (including critical care time)  CRITICAL CARE Performed by: Marily Memos   Total critical care time: 30  Critical care time was exclusive of separately billable procedures and treating other patients.  Critical care was necessary to treat or prevent imminent or life-threatening deterioration.  Critical care was time spent personally by me  on the following activities: development of treatment plan with patient and/or surrogate as well as nursing, discussions with consultants, evaluation of patient's response to treatment, examination of patient, obtaining history from patient or surrogate, ordering and performing treatments and interventions, ordering and review of laboratory studies, ordering and review of radiographic studies, pulse oximetry and re-evaluation of patient's condition.   Labs Review Labs Reviewed  CBC WITH DIFFERENTIAL/PLATELET - Abnormal; Notable for the following:    Monocytes Relative 13 (*)    All other components within normal limits  COMPREHENSIVE METABOLIC PANEL - Abnormal; Notable for the following:    Potassium 3.1 (*)    Glucose, Bld 43 (*)    All other components within normal limits  URINALYSIS, ROUTINE W REFLEX MICROSCOPIC (NOT AT [facility]) - Abnormal; Notable for the following:    Glucose, UA 100 (*)    All other components within normal limits  CBG MONITORING, ED - Abnormal; Notable for the following:    Glucose-Capillary >600 (*)    All other components within normal limits    Imaging Review Dg Chest 2 View  07/04/2015   CLINICAL DATA:  Fall this morning  EXAM: CHEST - 2 VIEW  COMPARISON:  05/07/2015  FINDINGS: Cardiac shadow is stable. Aortic calcifications are seen. The lungs are clear bilaterally. No acute infiltrate is identified. No acute fracture is seen.  IMPRESSION: No acute abnormality noted.  Aortic atherosclerotic disease is seen.   Electronically Signed   By: Alcide Clever M.D.   On: 07/04/2015 11:55   Ct Head Wo Contrast  07/04/2015   CLINICAL DATA:  Fall today without injury. Altered mental status and unresponsive. Initial encounter.  EXAM: CT HEAD WITHOUT CONTRAST  CT CERVICAL SPINE WITHOUT CONTRAST  TECHNIQUE: Multidetector CT imaging of the head and cervical spine was performed following the standard protocol without intravenous contrast. Multiplanar CT image reconstructions of the  cervical spine were also generated.  COMPARISON:  MRI brain 04/18/2012. Cervical spine radiographs 02/03/2012.  FINDINGS: CT HEAD FINDINGS  Moderate atrophy and white matter disease is similar to the prior study. No acute infarct, hemorrhage, or mass lesion is present. The ventricles are proportionate to the degree of atrophy. No significant extra-axial fluid collection is present.  No significant extracranial soft tissue injury is present. The paranasal sinuses and mastoid air cells are clear. The calvarium is intact. The globes and orbits are within normal limits.  CT CERVICAL SPINE FINDINGS  Cervical spine is imaged from skullbase through T1-2. Vertebral body heights and alignment are maintained. Asymmetric facet degenerative changes present on the right at C4-5. Uncovertebral spurring at C5-6 results in mild right foraminal narrowing bilaterally. Mild left foraminal narrowing is present at C6-7 due to uncovertebral and facet disease.  No acute fracture or traumatic subluxation is present. Scarring is present at the lung apices bilaterally.  IMPRESSION: 1. Stable  atrophy and white matter disease. 2. No acute intracranial abnormality or evidence for acute trauma. 3. Mild spondylosis of the cervical spine without evidence for acute fracture or traumatic subluxation.   Electronically Signed   By: Marin Roberts M.D.   On: 07/04/2015 11:53   Ct Cervical Spine Wo Contrast  07/04/2015   CLINICAL DATA:  Fall today without injury. Altered mental status and unresponsive. Initial encounter.  EXAM: CT HEAD WITHOUT CONTRAST  CT CERVICAL SPINE WITHOUT CONTRAST  TECHNIQUE: Multidetector CT imaging of the head and cervical spine was performed following the standard protocol without intravenous contrast. Multiplanar CT image reconstructions of the cervical spine were also generated.  COMPARISON:  MRI brain 04/18/2012. Cervical spine radiographs 02/03/2012.  FINDINGS: CT HEAD FINDINGS  Moderate atrophy and white matter  disease is similar to the prior study. No acute infarct, hemorrhage, or mass lesion is present. The ventricles are proportionate to the degree of atrophy. No significant extra-axial fluid collection is present.  No significant extracranial soft tissue injury is present. The paranasal sinuses and mastoid air cells are clear. The calvarium is intact. The globes and orbits are within normal limits.  CT CERVICAL SPINE FINDINGS  Cervical spine is imaged from skullbase through T1-2. Vertebral body heights and alignment are maintained. Asymmetric facet degenerative changes present on the right at C4-5. Uncovertebral spurring at C5-6 results in mild right foraminal narrowing bilaterally. Mild left foraminal narrowing is present at C6-7 due to uncovertebral and facet disease.  No acute fracture or traumatic subluxation is present. Scarring is present at the lung apices bilaterally.  IMPRESSION: 1. Stable atrophy and white matter disease. 2. No acute intracranial abnormality or evidence for acute trauma. 3. Mild spondylosis of the cervical spine without evidence for acute fracture or traumatic subluxation.   Electronically Signed   By: Marin Roberts M.D.   On: 07/04/2015 11:53     EKG Interpretation   Date/Time:  Thursday July 04 2015 10:25:38 EDT Ventricular Rate:  56 PR Interval:  144 QRS Duration: 69 QT Interval:  463 QTC Calculation: 447 R Axis:   30 Text Interpretation:  Sinus rhythm Left atrial enlargement Anteroseptal  infarct, old Baseline wander in lead(s) III Confirmed by Bonniejean Piano MD, Barbara Cower  [phone]) on 07/04/2015 11:43:49 AM      MDM   Final diagnoses:  Fall   74 year old female with persistent hypoglycemia even after eating and D50 pushes by EMS. Mental status status wavering during that time. D5 half-normal saline with potassium was started after she was found to still be hyperglycemic here. Is on glipizide so we will admit for observation and continued fingerstick checks. She was  started on every hour monitoring in the emergency department. I also checked to make sure she have any evidence of ischemia, infection or other abnormalities. This was negative. Patient also had a fall associated with this as referenced above the CT head and neck were done without any abnormalities so her c-collar was cleared.  The patient appears reasonably stabilized for admission considering the current resources, flow, and capabilities available in the ED at this time, and I doubt any other [facility] requiring further screening and/or treatment in the ED prior to admission.       Marily Memos, MD 07/04/15 2027

## 2015-07-04 NOTE — ED Notes (Signed)
Admitting at bedside 

## 2015-07-04 NOTE — H&P (Signed)
[facility] History and Physical  Diana Hartman OZH:[id number] DOB: May 02, 1941 DOA: 07/04/2015  Referring physician: Dr. Marily Memos, EDP PCP: No primary care provider on file.  Specialists:   Chief Complaint: Low blood sugar  HPI: Diana Hartman is a 74 y.o. female  With a history of diabetes mellitus, type II, dementia, hypertension, that presented to the emergency department with low blood sugars. It seems that patient was found this morning by her son beside the bed. Patient somehow that her sugars were low so he gave her glucose tablets and called his sister.  The patient was able to get up and walk down the hallway at which point she fell again and hit her head. There was no loss of consciousness noted. Patient was noted to have bowel incontinence which is been ongoing matter as per daughters at bedside. Upon EMS arrival, patient was noted to have a glucose level of 42 and she was given an amp of D50 and brought to the emergency department. In the emergency department, CT of the head and neck were done showing no acute intracranial abnormality or fracture. Chest x-ray and UA were also negative for infection. [facility] was called for admission.  Review of Systems: Patient has dementia with no complaints.  Past Medical History  Diagnosis Date  . Diabetes mellitus   . Hypertension   . Temporal arteritis   . Dementia    History reviewed. No pertinent past surgical history. Social History:  reports that she has never smoked. She does not have any smokeless tobacco history on file. She reports that she does not drink alcohol or use illicit drugs.   No Known Allergies  Family history Mother died in her 47s during childbirth. Father died in his 27s from CVA.  Prior to Admission medications   Medication Sig Start Date End Date Taking? Authorizing Provider  diltiazem (DILACOR XR) 180 MG 24 hr capsule Take 180 mg by mouth daily.      Historical Provider, MD  donepezil (ARICEPT) 10  MG tablet Take 10 mg by mouth daily.    Historical Provider, MD  glipiZIDE (GLUCOTROL XL) 5 MG 24 hr tablet Take 5 mg by mouth daily.      Historical Provider, MD  insulin detemir (LEVEMIR) 100 UNIT/ML injection Inject 20 Units into the skin 2 (two) times daily.     Historical Provider, MD  lisinopril (PRINIVIL,ZESTRIL) 40 MG tablet Take 40 mg by mouth daily.      Historical Provider, MD  metFORMIN (GLUCOPHAGE) 1000 MG tablet Take 1,000 mg by mouth daily with breakfast.    Historical Provider, MD  nebivolol (BYSTOLIC) 5 MG tablet Take 5 mg by mouth daily.    Historical Provider, MD  prednisoLONE acetate (PRED FORTE) 1 % ophthalmic suspension Place 1 drop into both eyes 4 (four) times daily.    Historical Provider, MD  predniSONE (DELTASONE) 10 MG tablet Take 10 mg by mouth daily.      Historical Provider, MD  sertraline (ZOLOFT) 50 MG tablet Take 50 mg by mouth daily.      Historical Provider, MD  zolpidem (AMBIEN) 5 MG tablet Take 5 mg by mouth at bedtime as needed for sleep.    Historical Provider, MD   Physical Exam: Filed Vitals:   07/04/15 1400  BP: 182/95  Pulse:   Resp: 14     General: Well developed, well nourished, NAD, appears stated age  HEENT: NCAT, PERRLA, EOMI, Anicteic Sclera, mucous membranes moist.   Neck: Supple,  no JVD, no masses  Cardiovascular: S1 S2 auscultated, no rubs, murmurs or gallops. Regular rate and rhythm.  Respiratory: Clear to auscultation bilaterally with equal chest rise  Abdomen: Soft, nontender, nondistended, + bowel sounds, umbilical hernia -reducible   Extremities: warm dry without cyanosis clubbing or edema  Neuro: AAOx1, cranial nerves grossly intact. Strength 5/5 in patient's upper and lower extremities bilaterally  Skin: Without rashes exudates or nodules  Psych: Pleasant  Labs on Admission:  Basic Metabolic Panel:  Recent Labs Lab 07/04/15 1240  NA 145  K 3.1*  CL 109  CO2 30  GLUCOSE 43*  BUN 7  CREATININE 0.53  CALCIUM  9.3   Liver Function Tests:  Recent Labs Lab 07/04/15 1240  AST 34  ALT 19  ALKPHOS 100  BILITOT 0.7  PROT 7.6  ALBUMIN 3.5   No results for input(s): LIPASE, AMYLASE in the last 168 hours. No results for input(s): AMMONIA in the last 168 hours. CBC:  Recent Labs Lab 07/04/15 1240  WBC 8.2  NEUTROABS 4.8  HGB 14.0  HCT 41.3  MCV 88.6  PLT 153   Cardiac Enzymes: No results for input(s): CKTOTAL, CKMB, CKMBINDEX, TROPONINI in the last 168 hours.  BNP (last 3 results) No results for input(s): BNP in the last 8760 hours.  ProBNP (last 3 results) No results for input(s): PROBNP in the last 8760 hours.  CBG:  Recent Labs Lab 07/04/15 1121 07/04/15 1430  GLUCAP >600* 148*    Radiological Exams on Admission: Dg Chest 2 View  07/04/2015   CLINICAL DATA:  Fall this morning  EXAM: CHEST - 2 VIEW  COMPARISON:  05/07/2015  FINDINGS: Cardiac shadow is stable. Aortic calcifications are seen. The lungs are clear bilaterally. No acute infiltrate is identified. No acute fracture is seen.  IMPRESSION: No acute abnormality noted.  Aortic atherosclerotic disease is seen.   Electronically Signed   By: Alcide Clever M.D.   On: 07/04/2015 11:55   Ct Head Wo Contrast  07/04/2015   CLINICAL DATA:  Fall today without injury. Altered mental status and unresponsive. Initial encounter.  EXAM: CT HEAD WITHOUT CONTRAST  CT CERVICAL SPINE WITHOUT CONTRAST  TECHNIQUE: Multidetector CT imaging of the head and cervical spine was performed following the standard protocol without intravenous contrast. Multiplanar CT image reconstructions of the cervical spine were also generated.  COMPARISON:  MRI brain 04/18/2012. Cervical spine radiographs 02/03/2012.  FINDINGS: CT HEAD FINDINGS  Moderate atrophy and white matter disease is similar to the prior study. No acute infarct, hemorrhage, or mass lesion is present. The ventricles are proportionate to the degree of atrophy. No significant extra-axial fluid  collection is present.  No significant extracranial soft tissue injury is present. The paranasal sinuses and mastoid air cells are clear. The calvarium is intact. The globes and orbits are within normal limits.  CT CERVICAL SPINE FINDINGS  Cervical spine is imaged from skullbase through T1-2. Vertebral body heights and alignment are maintained. Asymmetric facet degenerative changes present on the right at C4-5. Uncovertebral spurring at C5-6 results in mild right foraminal narrowing bilaterally. Mild left foraminal narrowing is present at C6-7 due to uncovertebral and facet disease.  No acute fracture or traumatic subluxation is present. Scarring is present at the lung apices bilaterally.  IMPRESSION: 1. Stable atrophy and white matter disease. 2. No acute intracranial abnormality or evidence for acute trauma. 3. Mild spondylosis of the cervical spine without evidence for acute fracture or traumatic subluxation.   Electronically Signed  By: Marin Roberts M.D.   On: 07/04/2015 11:53   Ct Cervical Spine Wo Contrast  07/04/2015   CLINICAL DATA:  Fall today without injury. Altered mental status and unresponsive. Initial encounter.  EXAM: CT HEAD WITHOUT CONTRAST  CT CERVICAL SPINE WITHOUT CONTRAST  TECHNIQUE: Multidetector CT imaging of the head and cervical spine was performed following the standard protocol without intravenous contrast. Multiplanar CT image reconstructions of the cervical spine were also generated.  COMPARISON:  MRI brain 04/18/2012. Cervical spine radiographs 02/03/2012.  FINDINGS: CT HEAD FINDINGS  Moderate atrophy and white matter disease is similar to the prior study. No acute infarct, hemorrhage, or mass lesion is present. The ventricles are proportionate to the degree of atrophy. No significant extra-axial fluid collection is present.  No significant extracranial soft tissue injury is present. The paranasal sinuses and mastoid air cells are clear. The calvarium is intact. The globes  and orbits are within normal limits.  CT CERVICAL SPINE FINDINGS  Cervical spine is imaged from skullbase through T1-2. Vertebral body heights and alignment are maintained. Asymmetric facet degenerative changes present on the right at C4-5. Uncovertebral spurring at C5-6 results in mild right foraminal narrowing bilaterally. Mild left foraminal narrowing is present at C6-7 due to uncovertebral and facet disease.  No acute fracture or traumatic subluxation is present. Scarring is present at the lung apices bilaterally.  IMPRESSION: 1. Stable atrophy and white matter disease. 2. No acute intracranial abnormality or evidence for acute trauma. 3. Mild spondylosis of the cervical spine without evidence for acute fracture or traumatic subluxation.   Electronically Signed   By: Marin Roberts M.D.   On: 07/04/2015 11:53    EKG: Independently reviewed. Sinus bradycardia, rate 56  Assessment/Plan Hypoglycemia in a patient with known diabetes mellitus, type 2 -Will admit patient to medical floor and monitor her CBGs -Upon arrival to the emergency department, glucose was 42, patient was given D50, currently 148 -Will consult diabetes coordinator -Will order hemoglobin A1c -Hold home medications of metformin and glipizide and Levemir -Will place on insulin sliding scale CBG monitoring -Patient likely does not need all 3 medications upon discharge, spoke with family regarding this.  Fall -Will consult physical therapy for evaluation -Possibly secondary to hypoglycemia -CT head/cervical spine: Stable atrophy and white matter disease, no intracranial abnormality, no acute trauma  Hypertension -Currently uncontrolled, patient has not taken her medications today -Continue lisinopril, bystolic, diltiazem -Will place on hydralazine when necessary  Depression -Continue Zoloft  Dementia -Continue Aricept  Temporal arteritis -Continue prednisone   DVT prophylaxis: Lovenox  Code Status:  Full  Condition: Guarded  Family Communication: Daughters at bedside.  Admission, patients condition and plan of care including tests being ordered have been discussed with the patient's daughters who indicate understanding and agree with the plan and Code Status.  Disposition Plan: Admitted for observation  Time spent: 65 minutes  Axel Frisk D.O. [facility] Pager [phone]  If 7PM-7AM, please contact night-coverage [email or web address] Password [facility] 07/04/2015, 2:38 PM

## 2015-07-04 NOTE — ED Notes (Signed)
Waiting for meds from pharmacy. 

## 2015-07-05 DIAGNOSIS — E11649 Type 2 diabetes mellitus with hypoglycemia without coma: Secondary | ICD-10-CM | POA: Diagnosis not present

## 2015-07-05 DIAGNOSIS — W19XXXA Unspecified fall, initial encounter: Secondary | ICD-10-CM | POA: Diagnosis not present

## 2015-07-05 DIAGNOSIS — E118 Type 2 diabetes mellitus with unspecified complications: Secondary | ICD-10-CM | POA: Diagnosis not present

## 2015-07-05 DIAGNOSIS — E162 Hypoglycemia, unspecified: Secondary | ICD-10-CM | POA: Diagnosis not present

## 2015-07-05 LAB — GLUCOSE, CAPILLARY
GLUCOSE-CAPILLARY: 161 mg/dL — AB (ref 65–99)
GLUCOSE-CAPILLARY: 145 mg/dL — AB (ref 65–99)
GLUCOSE-CAPILLARY: 384 mg/dL — AB (ref 65–99)
Glucose-Capillary: 228 mg/dL — ABNORMAL HIGH (ref 65–99)
Glucose-Capillary: 85 mg/dL (ref 65–99)

## 2015-07-05 LAB — BASIC METABOLIC PANEL
Anion gap: 7 (ref 5–15)
BUN: 5 mg/dL — AB (ref 6–20)
CALCIUM: 8.7 mg/dL — AB (ref 8.9–10.3)
CO2: 28 mmol/L (ref 22–32)
Chloride: 103 mmol/L (ref 101–111)
Creatinine, Ser: 0.6 mg/dL (ref 0.44–1.00)
GFR calc non Af Amer: >60 mL/min (ref >60)
GLUCOSE: 186 mg/dL — AB (ref 65–99)
POTASSIUM: 3.4 mmol/L — AB (ref 3.5–5.1)
Sodium: 138 mmol/L (ref 135–145)

## 2015-07-05 LAB — HEMOGLOBIN A1C
Hgb A1c MFr Bld: 6.8 % — ABNORMAL HIGH (ref 4.8–5.6)
Mean Plasma Glucose: 148 mg/dL

## 2015-07-05 LAB — CBC
HEMATOCRIT: 37 % (ref 36.0–46.0)
Hemoglobin: 12.5 g/dL (ref 12.0–15.0)
MCH: 29.9 pg (ref 26.0–34.0)
MCHC: 33.8 g/dL (ref 30.0–36.0)
MCV: 88.5 fL (ref 78.0–100.0)
Platelets: 141 10*3/uL — ABNORMAL LOW (ref 150–400)
RBC: 4.18 MIL/uL (ref 3.87–5.11)
RDW: 14.1 % (ref 11.5–15.5)
WBC: 6 10*3/uL (ref 4.0–10.5)

## 2015-07-05 MED ORDER — INSULIN DETEMIR 100 UNIT/ML ~~LOC~~ SOLN
14.0000 [IU] | Freq: Two times a day (BID) | SUBCUTANEOUS | Status: DC
  Administered 2015-07-05 – 2015-07-06 (×2): 14 [IU] via SUBCUTANEOUS
  Filled 2015-07-05 (×3): qty 0.14

## 2015-07-05 MED ORDER — INSULIN DETEMIR 100 UNIT/ML ~~LOC~~ SOLN
8.0000 [IU] | Freq: Two times a day (BID) | SUBCUTANEOUS | Status: DC
  Administered 2015-07-05: 8 [IU] via SUBCUTANEOUS
  Filled 2015-07-05 (×2): qty 0.08

## 2015-07-05 NOTE — Evaluation (Signed)
Physical Therapy Evaluation Patient Details Name: Diana Hartman MRN: [id number] DOB: May 31, 1941 Today's Date: 07/05/2015   History of Present Illness  74 year old female with persistent hypoglycemia even after eating and D50 pushes by EMS. Mental status status wavering during that time. mic here. we will admit for observation and continued fingerstick checks. She was started on every hour monitoring in the emergency department. I also checked to make sure she have any evidence of ischemia, infection or other abnormalities. This was negative..  Pt did have fall and hit her head - CT and cervical cleared  Clinical Impression  Pt presents at baseline per daughter - daughter present for eval today which was helpful.  Pt not oriented - doesn't know she is in hospital.  Pt doesn't follow tactile or verbal commands without repetition.  Pt pleasant.  Pt walked 120 feet in hall with min assist.  Daughter reports pt does well at home with son's 24/7. She reprots that son walks with her when up - this is recommended for pt safety.  We talked about safety at home - house has double locks on all doors and security system that talks if a door is opened.  Pt has gotten out and wandered - talked about GPS ideas for this.  It sounds like family has a set up that is working for family and pt.  They plan to continue as is for now.  No equipment or follow up needs identified at this time.    Follow Up Recommendations No PT follow up;Supervision/Assistance - 24 hour    Equipment Recommendations  None recommended by PT    Recommendations for Other Services       Precautions / Restrictions Precautions Precautions: Fall (wandering) Restrictions Weight Bearing Restrictions: No      Mobility  Bed Mobility Overal bed mobility: Needs Assistance Bed Mobility: Supine to Sit     Supine to sit: Min guard        Transfers Overall transfer level: Needs assistance   Transfers: Sit to/from Stand Sit to  Stand: Min guard            Ambulation/Gait Ambulation/Gait assistance: Min assist Ambulation Distance (Feet): 120 Feet Assistive device: 1 person hand held assist Gait Pattern/deviations: Trunk flexed;Shuffle;Decreased stride length     General Gait Details:  (pt wanting to hold to rails etc - anything she passed by - daughter said this is how she did at home - wouldnt use assisted device)  Stairs            Wheelchair Mobility    Modified Rankin (Stroke Patients Only)       Balance                                             Pertinent Vitals/Pain Pain Assessment: No/denies pain    Home Living Family/patient expects to be discharged to:: Private residence Living Arrangements: Children   Type of Home: House Home Access: Level entry     Home Layout: One level Home Equipment: Gilmer Mor - single point Additional Comments: pt lives with son that provides 24/7 care. pt has cane but doesnt use -she furniture walks around the house.  She falls only when hypoglycemic.  pts daughter present for eval.  pt does have history of wandering - doors have double locks and security system that talks if a door is opened.  Prior Function Level of Independence: Needs assistance (due to dementia)               Hand Dominance        Extremity/Trunk Assessment   Upper Extremity Assessment: Overall WFL for tasks assessed           Lower Extremity Assessment: Overall WFL for tasks assessed      Cervical / Trunk Assessment: Kyphotic (daughter reports this is normal for pt)  Communication   Communication:  (limited due to dementia - pt pleasant)  Cognition Arousal/Alertness: Awake/alert Behavior During Therapy: Impulsive Overall Cognitive Status:  (pt has hard time following verbal and tactile commands - needs repetition)       Memory: Decreased short-term memory;Decreased recall of precautions              General Comments       Exercises        Assessment/Plan    PT Assessment    PT Diagnosis Difficulty walking;Generalized weakness   PT Problem List    PT Treatment Interventions     PT Goals (Current goals can be found in the Care Plan section) Acute Rehab PT Goals Patient Stated Goal: family goal - to get pt back home with family PT Goal Formulation: With patient/family Time For Goal Achievement: 07/12/15 Potential to Achieve Goals: Good    Frequency     Barriers to discharge        Co-evaluation               End of Session Equipment Utilized During Treatment: Gait belt Activity Tolerance: Patient tolerated treatment well Patient left: in chair;with family/visitor present;with call bell/phone within reach Nurse Communication: Mobility status         Time: 1125-1210 PT Time Calculation (min) (ACUTE ONLY): 45 min   Charges:     PT Treatments $Gait Training: 23-37 mins   PT G Codes:        Judson Roch 07/05/2015, 2:13 PM 07/05/2015   Ranae Palms, PT

## 2015-07-05 NOTE — Progress Notes (Signed)
PROGRESS NOTE  Diana Hartman:[id number] DOB: [birth year] DOA: 07/04/2015 PCP: No primary care provider on file.   HPI: 74 y.o. female with a history of diabetes mellitus, type II, dementia, hypertension admitted with hypoglycemia  Subjective / 24 H Interval events - with dementia, she is very happy, has no complaints   Assessment/Plan: Active Problems:   Hypoglycemia   Diabetes mellitus, type II   Essential hypertension   Dementia   Fall   Hypoglycemia - Patient was restarted on low dose of Levemir at 8 units, and today throughout the day her sugars have progressively increased. We'll readjust Levemir 14 units twice a day - Hemoglobin A1c shows overall good control at 6.8 - We'll probably discontinue glipizide on discharge  Fall - PT evaluated patient - Possibly secondary to hypoglycemia - CT head/cervical spine: Stable atrophy and white matter disease, no intracranial abnormality, no acute trauma  Hypertension - suboptimal - Continue lisinopril, bystolic, diltiazem - Will place on hydralazine when necessary  Depression -Continue Zoloft  Dementia -Continue Aricept  Temporal arteritis -Continue prednisone  Diet: Diet heart healthy/carb modified Room service appropriate?: Yes; Fluid consistency:: Thin Fluids: none DVT Prophylaxis: Lovenox  Code Status: Full Code Family Communication: d/w daughters bedside  Disposition Plan: home in am  Consultants:  None   Procedures:  None    Antibiotics  Anti-infectives    None       Studies  Dg Chest 2 View  07/04/2015   CLINICAL DATA:  Fall this morning  EXAM: CHEST - 2 VIEW  COMPARISON:  05/07/2015  FINDINGS: Cardiac shadow is stable. Aortic calcifications are seen. The lungs are clear bilaterally. No acute infiltrate is identified. No acute fracture is seen.  IMPRESSION: No acute abnormality noted.  Aortic atherosclerotic disease is seen.   Electronically Signed   By: Alcide Clever M.D.   On:  07/04/2015 11:55   Ct Head Wo Contrast  07/04/2015   CLINICAL DATA:  Fall today without injury. Altered mental status and unresponsive. Initial encounter.  EXAM: CT HEAD WITHOUT CONTRAST  CT CERVICAL SPINE WITHOUT CONTRAST  TECHNIQUE: Multidetector CT imaging of the head and cervical spine was performed following the standard protocol without intravenous contrast. Multiplanar CT image reconstructions of the cervical spine were also generated.  COMPARISON:  MRI brain 04/18/2012. Cervical spine radiographs 02/03/2012.  FINDINGS: CT HEAD FINDINGS  Moderate atrophy and white matter disease is similar to the prior study. No acute infarct, hemorrhage, or mass lesion is present. The ventricles are proportionate to the degree of atrophy. No significant extra-axial fluid collection is present.  No significant extracranial soft tissue injury is present. The paranasal sinuses and mastoid air cells are clear. The calvarium is intact. The globes and orbits are within normal limits.  CT CERVICAL SPINE FINDINGS  Cervical spine is imaged from skullbase through T1-2. Vertebral body heights and alignment are maintained. Asymmetric facet degenerative changes present on the right at C4-5. Uncovertebral spurring at C5-6 results in mild right foraminal narrowing bilaterally. Mild left foraminal narrowing is present at C6-7 due to uncovertebral and facet disease.  No acute fracture or traumatic subluxation is present. Scarring is present at the lung apices bilaterally.  IMPRESSION: 1. Stable atrophy and white matter disease. 2. No acute intracranial abnormality or evidence for acute trauma. 3. Mild spondylosis of the cervical spine without evidence for acute fracture or traumatic subluxation.   Electronically Signed   By: Marin Roberts M.D.   On: 07/04/2015 11:53   Ct Cervical  Spine Wo Contrast  07/04/2015   CLINICAL DATA:  Fall today without injury. Altered mental status and unresponsive. Initial encounter.  EXAM: CT HEAD  WITHOUT CONTRAST  CT CERVICAL SPINE WITHOUT CONTRAST  TECHNIQUE: Multidetector CT imaging of the head and cervical spine was performed following the standard protocol without intravenous contrast. Multiplanar CT image reconstructions of the cervical spine were also generated.  COMPARISON:  MRI brain 04/18/2012. Cervical spine radiographs 02/03/2012.  FINDINGS: CT HEAD FINDINGS  Moderate atrophy and white matter disease is similar to the prior study. No acute infarct, hemorrhage, or mass lesion is present. The ventricles are proportionate to the degree of atrophy. No significant extra-axial fluid collection is present.  No significant extracranial soft tissue injury is present. The paranasal sinuses and mastoid air cells are clear. The calvarium is intact. The globes and orbits are within normal limits.  CT CERVICAL SPINE FINDINGS  Cervical spine is imaged from skullbase through T1-2. Vertebral body heights and alignment are maintained. Asymmetric facet degenerative changes present on the right at C4-5. Uncovertebral spurring at C5-6 results in mild right foraminal narrowing bilaterally. Mild left foraminal narrowing is present at C6-7 due to uncovertebral and facet disease.  No acute fracture or traumatic subluxation is present. Scarring is present at the lung apices bilaterally.  IMPRESSION: 1. Stable atrophy and white matter disease. 2. No acute intracranial abnormality or evidence for acute trauma. 3. Mild spondylosis of the cervical spine without evidence for acute fracture or traumatic subluxation.   Electronically Signed   By: Marin Roberts M.D.   On: 07/04/2015 11:53    Objective  Filed Vitals:   07/04/15 2128 07/04/15 2354 07/05/15 0506 07/05/15 1351  BP: 178/80 168/82 167/78 158/90  Pulse: 62  64 86  Temp: 98.3 F (36.8 C)  98.2 F (36.8 C) 98.2 F (36.8 C)  TempSrc: Oral  Oral Oral  Resp: 18  17 18   SpO2: 99%  98% 100%    Intake/Output Summary (Last 24 hours) at 07/05/15 1802 Last  data filed at 07/05/15 1355  Gross per 24 hour  Intake   1405 ml  Output      0 ml  Net   1405 ml   There were no vitals filed for this visit.  Exam:  General:  NAD  HEENT: no scleral icterus, PERRL  Cardiovascular: RRR without MRG, 2+ peripheral pulses, no edema  Respiratory: CTA biL, good air movement, no wheezing, no crackles, no rales  Abdomen: soft, non tender  MSK/Extremities: no clubbing/cyanosis, no joint swelling  Data Reviewed: Basic Metabolic Panel:  Recent Labs Lab 07/04/15 1240 07/04/15 1809 07/05/15 0330  NA 145  --  138  K 3.1*  --  3.4*  CL 109  --  103  CO2 30  --  28  GLUCOSE 43*  --  186*  BUN 7  --  5*  CREATININE 0.53 0.70 0.60  CALCIUM 9.3  --  8.7*   Liver Function Tests:  Recent Labs Lab 07/04/15 1240  AST 34  ALT 19  ALKPHOS 100  BILITOT 0.7  PROT 7.6  ALBUMIN 3.5   CBC:  Recent Labs Lab 07/04/15 1240 07/04/15 1809 07/05/15 0330  WBC 8.2 5.8 6.0  NEUTROABS 4.8  --   --   HGB 14.0 12.7 12.5  HCT 41.3 37.6 37.0  MCV 88.6 87.2 88.5  PLT 153 156 141*   CBG:  Recent Labs Lab 07/04/15 2339 07/05/15 0353 07/05/15 0757 07/05/15 1133 07/05/15 1554  GLUCAP 293*  161* 145* 228* 384*   Scheduled Meds: . diltiazem  240 mg Oral Daily  . donepezil  10 mg Oral Daily  . enoxaparin (LOVENOX) injection  40 mg Subcutaneous Q24H  . insulin aspart  0-9 Units Subcutaneous TID WC  . insulin detemir  14 Units Subcutaneous BID  . lisinopril  40 mg Oral Daily  . nebivolol  5 mg Oral Daily  . prednisoLONE acetate  1 drop Both Eyes QID  . predniSONE  9 mg Oral Daily  . sertraline  50 mg Oral Daily  . sodium chloride  3 mL Intravenous Q12H   Continuous Infusions:   Pamella Pert, MD [facility] Pager [phone]. If 7 PM - 7 AM, please contact night-coverage at [email or web address], password [facility] 07/05/2015, 6:02 PM

## 2015-07-06 DIAGNOSIS — E162 Hypoglycemia, unspecified: Secondary | ICD-10-CM | POA: Diagnosis not present

## 2015-07-06 DIAGNOSIS — E118 Type 2 diabetes mellitus with unspecified complications: Secondary | ICD-10-CM | POA: Diagnosis not present

## 2015-07-06 DIAGNOSIS — W19XXXA Unspecified fall, initial encounter: Secondary | ICD-10-CM | POA: Diagnosis not present

## 2015-07-06 DIAGNOSIS — E11649 Type 2 diabetes mellitus with hypoglycemia without coma: Secondary | ICD-10-CM | POA: Diagnosis not present

## 2015-07-06 LAB — GLUCOSE, CAPILLARY
Glucose-Capillary: 153 mg/dL — ABNORMAL HIGH (ref 65–99)
Glucose-Capillary: 155 mg/dL — ABNORMAL HIGH (ref 65–99)
Glucose-Capillary: 223 mg/dL — ABNORMAL HIGH (ref 65–99)

## 2015-07-06 MED ORDER — INSULIN DETEMIR 100 UNIT/ML ~~LOC~~ SOLN: 10.0000 [IU] | Freq: Two times a day (BID) | SUBCUTANEOUS | Status: DC

## 2015-07-06 NOTE — Progress Notes (Signed)
Called and talked to CM, no meters from the hospital, to take to [facility] or drug store and purchase, explained to family.

## 2015-07-06 NOTE — Discharge Summary (Signed)
Physician Discharge Summary  Diana Hartman DGL:[id number] DOB: 22-Jul-1941 DOA: 07/04/2015  PCP: No primary care provider on file.  Admit date: 07/04/2015 Discharge date: 07/06/2015  Time spent: > 35 minutes  Recommendations for Outpatient Follow-up:  1. Follow with PCP in 1-2 weeks 2. Monitor CBS 4 times daily until seen by PCP  Discharge Diagnoses:  Active Problems:   Hypoglycemia   Diabetes mellitus, type II   Essential hypertension   Dementia   Fall  Discharge Condition: stable  Diet recommendation: diabetic  History of present illness:  Diana Hartman is a 74 y.o. female With a history of diabetes mellitus, type II, dementia, hypertension, that presented to the emergency department with low blood sugars. It seems that patient was found this morning by her son beside the bed. Patient somehow that her sugars were low so he gave her glucose tablets and called his sister. The patient was able to get up and walk down the hallway at which point she fell again and hit her head. There was no loss of consciousness noted. Patient was noted to have bowel incontinence which is been ongoing matter as per daughters at bedside. Upon EMS arrival, patient was noted to have a glucose level of 42 and she was given an amp of D50 and brought to the emergency department. In the emergency department, CT of the head and neck were done showing no acute intracranial abnormality or fracture. Chest x-ray and UA were also negative for infection. [facility] was called for admission.  Hospital Course:  Patient was admitted to the hospital with hypoglycemia. Her diabetic medications were discontinued and her CBGs recovered. Based on her inpatient CBGs, her insulin was adjusted to 10 U BID on discharge. She was instructed to discontinue Glipizide and continue Metformin. She was advised to check her CBGs 4 times daily for the next week and to follow up with her PCP. Family expressed understanding. She is on prednisone  for temporal arteritis and as it will be tapered down I suspect that she will need further adjustments to her diabetic regimen. Given fall, PT evaluated patient and recommended no PT follow up. Patient was discharged home in stable condition, without further hypoglycemic episodes and at baseline.   Procedures:  None    Consultations:  None   Discharge Exam: Filed Vitals:   07/05/15 0506 07/05/15 1351 07/05/15 2255 07/06/15 0443  BP: 167/78 158/90 171/70 162/77  Pulse: 64 86 61 57  Temp: 98.2 F (36.8 C) 98.2 F (36.8 C) 98.1 F (36.7 C) 97.8 F (36.6 C)  TempSrc: Oral Oral Oral Oral  Resp: SpO2: 98% 100% 100% 100%   General: NAD Cardiovascular: RRR Respiratory: CTA biL  Discharge Instructions     Medication List    STOP taking these medications        glipiZIDE 5 MG 24 hr tablet  Commonly known as:  GLUCOTROL XL      TAKE these medications        diltiazem 240 MG 24 hr capsule  Commonly known as:  TIAZAC  Take 240 mg by mouth daily.     donepezil 10 MG tablet  Commonly known as:  ARICEPT  Take 10 mg by mouth daily.     insulin detemir 100 UNIT/ML injection  Commonly known as:  LEVEMIR  Inject 0.1 mLs (10 Units total) into the skin 2 (two) times daily.     lisinopril 40 MG tablet  Commonly known as:  PRINIVIL,ZESTRIL  Take 40 mg by mouth daily.     metFORMIN 1000 MG tablet  Commonly known as:  GLUCOPHAGE  Take 1,000 mg by mouth daily with breakfast.     nebivolol 5 MG tablet  Commonly known as:  BYSTOLIC  Take 5 mg by mouth daily.     prednisoLONE acetate 1 % ophthalmic suspension  Commonly known as:  PRED FORTE  Place 1 drop into both eyes 4 (four) times daily.     predniSONE 1 MG tablet  Commonly known as:  DELTASONE  Take 9 mg by mouth daily. For 3 weeks starting 06-30-15, then decrease by 1 tablet every 3 weeks as directed.     sertraline 50 MG tablet  Commonly known as:  ZOLOFT  Take 50 mg by mouth daily.     zolpidem 10  MG tablet  Commonly known as:  AMBIEN  Take 10 mg by mouth at bedtime.       The results of significant diagnostics from this hospitalization (including imaging, microbiology, ancillary and laboratory) are listed below for reference.    Significant Diagnostic Studies: Dg Chest 2 View  07/04/2015   CLINICAL DATA:  Fall this morning  EXAM: CHEST - 2 VIEW  COMPARISON:  05/07/2015  FINDINGS: Cardiac shadow is stable. Aortic calcifications are seen. The lungs are clear bilaterally. No acute infiltrate is identified. No acute fracture is seen.  IMPRESSION: No acute abnormality noted.  Aortic atherosclerotic disease is seen.   Electronically Signed   By: Alcide Clever M.D.   On: 07/04/2015 11:55   Ct Head Wo Contrast  07/04/2015   CLINICAL DATA:  Fall today without injury. Altered mental status and unresponsive. Initial encounter.  EXAM: CT HEAD WITHOUT CONTRAST  CT CERVICAL SPINE WITHOUT CONTRAST  TECHNIQUE: Multidetector CT imaging of the head and cervical spine was performed following the standard protocol without intravenous contrast. Multiplanar CT image reconstructions of the cervical spine were also generated.  COMPARISON:  MRI brain 04/18/2012. Cervical spine radiographs 02/03/2012.  FINDINGS: CT HEAD FINDINGS  Moderate atrophy and white matter disease is similar to the prior study. No acute infarct, hemorrhage, or mass lesion is present. The ventricles are proportionate to the degree of atrophy. No significant extra-axial fluid collection is present.  No significant extracranial soft tissue injury is present. The paranasal sinuses and mastoid air cells are clear. The calvarium is intact. The globes and orbits are within normal limits.  CT CERVICAL SPINE FINDINGS  Cervical spine is imaged from skullbase through T1-2. Vertebral body heights and alignment are maintained. Asymmetric facet degenerative changes present on the right at C4-5. Uncovertebral spurring at C5-6 results in mild right foraminal  narrowing bilaterally. Mild left foraminal narrowing is present at C6-7 due to uncovertebral and facet disease.  No acute fracture or traumatic subluxation is present. Scarring is present at the lung apices bilaterally.  IMPRESSION: 1. Stable atrophy and white matter disease. 2. No acute intracranial abnormality or evidence for acute trauma. 3. Mild spondylosis of the cervical spine without evidence for acute fracture or traumatic subluxation.   Electronically Signed   By: Marin Roberts M.D.   On: 07/04/2015 11:53   Ct Cervical Spine Wo Contrast  07/04/2015   CLINICAL DATA:  Fall today without injury. Altered mental status and unresponsive. Initial encounter.  EXAM: CT HEAD WITHOUT CONTRAST  CT CERVICAL SPINE WITHOUT CONTRAST  TECHNIQUE: Multidetector CT imaging of the head and cervical spine was performed following the standard protocol without intravenous contrast. Multiplanar CT image  reconstructions of the cervical spine were also generated.  COMPARISON:  MRI brain 04/18/2012. Cervical spine radiographs 02/03/2012.  FINDINGS: CT HEAD FINDINGS  Moderate atrophy and white matter disease is similar to the prior study. No acute infarct, hemorrhage, or mass lesion is present. The ventricles are proportionate to the degree of atrophy. No significant extra-axial fluid collection is present.  No significant extracranial soft tissue injury is present. The paranasal sinuses and mastoid air cells are clear. The calvarium is intact. The globes and orbits are within normal limits.  CT CERVICAL SPINE FINDINGS  Cervical spine is imaged from skullbase through T1-2. Vertebral body heights and alignment are maintained. Asymmetric facet degenerative changes present on the right at C4-5. Uncovertebral spurring at C5-6 results in mild right foraminal narrowing bilaterally. Mild left foraminal narrowing is present at C6-7 due to uncovertebral and facet disease.  No acute fracture or traumatic subluxation is present.  Scarring is present at the lung apices bilaterally.  IMPRESSION: 1. Stable atrophy and white matter disease. 2. No acute intracranial abnormality or evidence for acute trauma. 3. Mild spondylosis of the cervical spine without evidence for acute fracture or traumatic subluxation.   Electronically Signed   By: Marin Roberts M.D.   On: 07/04/2015 11:53   Labs: Basic Metabolic Panel:  Recent Labs Lab 07/04/15 1240 07/04/15 1809 07/05/15 0330  NA 145  --  138  K 3.1*  --  3.4*  CL 109  --  103  CO2 30  --  28  GLUCOSE 43*  --  186*  BUN 7  --  5*  CREATININE 0.53 0.70 0.60  CALCIUM 9.3  --  8.7*   Liver Function Tests:  Recent Labs Lab 07/04/15 1240  AST 34  ALT 19  ALKPHOS 100  BILITOT 0.7  PROT 7.6  ALBUMIN 3.5   CBC:  Recent Labs Lab 07/04/15 1240 07/04/15 1809 07/05/15 0330  WBC 8.2 5.8 6.0  NEUTROABS 4.8  --   --   HGB 14.0 12.7 12.5  HCT 41.3 37.6 37.0  MCV 88.6 87.2 88.5  PLT 153 156 141*   CBG:  Recent Labs Lab 07/05/15 1554 07/05/15 2004 07/05/15 2359 07/06/15 0447 07/06/15 0730  GLUCAP 384* 85 223* 153* 155*    Signed:  GHERGHE, COSTIN  [facility] 07/06/2015, 3:01 PM

## 2015-07-06 NOTE — Progress Notes (Signed)
Pt ready for DC to home accomp by daughter.  All DC instructions reviewed with dtr and copy given.  Rx for glucose meter given and explained.  Dtr understands to obtain from a pharmacy.  No further questions about home self care.

## 2015-07-06 NOTE — Discharge Instructions (Signed)
Follow with your PCP in 5-7 days  Please get a complete blood count and chemistry panel checked by your Primary MD at your next visit, and again as instructed by your Primary MD. Please get your medications reviewed and adjusted by your Primary MD.  Please request your Primary MD to go over all Hospital Tests and Procedure/Radiological results at the follow up, please get all Hospital records sent to your Prim MD by signing hospital release before you go home.  If you had Pneumonia of Lung problems at the Hospital: Please get a 2 view Chest X ray done in 6-8 weeks after hospital discharge or sooner if instructed by your Primary MD.  If you have Congestive Heart Failure: Please call your Cardiologist or Primary MD anytime you have any of the following symptoms:  1) 3 pound weight gain in 24 hours or 5 pounds in 1 week  2) shortness of breath, with or without a dry hacking cough  3) swelling in the hands, feet or stomach  4) if you have to sleep on extra pillows at night in order to breathe  Follow cardiac low salt diet and 1.5 lit/day fluid restriction.  If you have diabetes Accuchecks 4 times/day, Once in AM empty stomach and then before each meal. Log in all results and show them to your primary doctor at your next visit. If any glucose reading is under 80 or above 300 call your primary MD immediately.  If you have Seizure/Convulsions/Epilepsy: Please do not drive, operate heavy machinery, participate in activities at heights or participate in high speed sports until you have seen by Primary MD or a Neurologist and advised to do so again.  If you had Gastrointestinal Bleeding: Please ask your Primary MD to check a complete blood count within one week of discharge or at your next visit. Your endoscopic/colonoscopic biopsies that are pending at the time of discharge, will also need to followed by your Primary MD.  Get Medicines reviewed and adjusted. Please take all your medications with  you for your next visit with your Primary MD  Please request your Primary MD to go over all hospital tests and procedure/radiological results at the follow up, please ask your Primary MD to get all Hospital records sent to his/her office.  If you experience worsening of your admission symptoms, develop shortness of breath, life threatening emergency, suicidal or homicidal thoughts you must seek medical attention immediately by calling 911 or calling your MD immediately  if symptoms less severe.  You must read complete instructions/literature along with all the possible adverse reactions/side effects for all the Medicines you take and that have been prescribed to you. Take any new Medicines after you have completely understood and accpet all the possible adverse reactions/side effects.   Do not drive or operate heavy machinery when taking Pain medications.   Do not take more than prescribed Pain, Sleep and Anxiety Medications  Special Instructions: If you have smoked or chewed Tobacco  in the last 2 yrs please stop smoking, stop any regular Alcohol  and or any Recreational drug use.  Wear Seat belts while driving.  Please note You were cared for by a hospitalist during your hospital stay. If you have any questions about your discharge medications or the care you received while you were in the hospital after you are discharged, you can call the unit and asked to speak with the hospitalist on call if the hospitalist that took care of you is not available. Once you  are discharged, your primary care physician will handle any further medical issues. Please note that NO REFILLS for any discharge medications will be authorized once you are discharged, as it is imperative that you return to your primary care physician (or establish a relationship with a primary care physician if you do not have one) for your aftercare needs so that they can reassess your need for medications and monitor your lab  values.  You can reach the hospitalist office at phone [phone] or fax [phone]   If you do not have a primary care physician, you can call [phone] for a physician referral.  Activity: As tolerated with Full fall precautions use walker/cane & assistance as needed  Diet: diabetic  Disposition Home

## 2015-07-17 NOTE — Progress Notes (Signed)
PT evaluation addendum Late entry for 07-28-2015 G-codes   July 28, 2015 1412  PT Time Calculation  PT Start Time (ACUTE ONLY) 1125  PT Stop Time (ACUTE ONLY) 1210  PT Time Calculation (min) (ACUTE ONLY) 45 min  PT G-Codes **NOT FOR INPATIENT CLASS**  Functional Assessment Tool Used clinical judgement/chart review  Functional Limitation Mobility: Walking and moving around  Mobility: Walking and Moving Around Current Status [phone]) CJ  Mobility: Walking and Moving Around Goal Status (U0454) CJ  Mobility: Walking and Moving Around Discharge Status (U9811) CJ  PT General Charges  $$ ACUTE PT VISIT 1 Procedure  PT Treatments  $Gait Training 23-37 mins   07/17/2015 [place], [place] [phone]

## 2015-08-09 DIAGNOSIS — M316 Other giant cell arteritis: Secondary | ICD-10-CM | POA: Diagnosis not present

## 2015-08-09 DIAGNOSIS — N390 Urinary tract infection, site not specified: Secondary | ICD-10-CM | POA: Diagnosis not present

## 2015-08-09 DIAGNOSIS — H20021 Recurrent acute iridocyclitis, right eye: Secondary | ICD-10-CM | POA: Diagnosis not present

## 2015-12-19 ENCOUNTER — Emergency Department (HOSPITAL_COMMUNITY)
Admission: EM | Admit: 2015-12-19 | Discharge: 2015-12-19 | Disposition: A | Discharge status: Home / Self Care | Payer: Medicare Other | Attending: Emergency Medicine | Admitting: Emergency Medicine

## 2015-12-19 ENCOUNTER — Encounter (HOSPITAL_COMMUNITY): Payer: Self-pay

## 2015-12-19 ENCOUNTER — Emergency Department (HOSPITAL_COMMUNITY): Payer: Medicare Other

## 2015-12-19 DIAGNOSIS — E119 Type 2 diabetes mellitus without complications: Secondary | ICD-10-CM | POA: Diagnosis not present

## 2015-12-19 DIAGNOSIS — Z79899 Other long term (current) drug therapy: Secondary | ICD-10-CM | POA: Insufficient documentation

## 2015-12-19 DIAGNOSIS — S0191XA Laceration without foreign body of unspecified part of head, initial encounter: Secondary | ICD-10-CM | POA: Insufficient documentation

## 2015-12-19 DIAGNOSIS — Y999 Unspecified external cause status: Secondary | ICD-10-CM | POA: Insufficient documentation

## 2015-12-19 DIAGNOSIS — Y92002 Bathroom of unspecified non-institutional (private) residence single-family (private) house as the place of occurrence of the external cause: Secondary | ICD-10-CM | POA: Diagnosis not present

## 2015-12-19 DIAGNOSIS — Z8739 Personal history of other diseases of the musculoskeletal system and connective tissue: Secondary | ICD-10-CM | POA: Insufficient documentation

## 2015-12-19 DIAGNOSIS — Z794 Long term (current) use of insulin: Secondary | ICD-10-CM | POA: Diagnosis not present

## 2015-12-19 DIAGNOSIS — W01198A Fall on same level from slipping, tripping and stumbling with subsequent striking against other object, initial encounter: Secondary | ICD-10-CM | POA: Diagnosis not present

## 2015-12-19 DIAGNOSIS — Z23 Encounter for immunization: Secondary | ICD-10-CM | POA: Diagnosis not present

## 2015-12-19 DIAGNOSIS — Y9389 Activity, other specified: Secondary | ICD-10-CM | POA: Diagnosis not present

## 2015-12-19 DIAGNOSIS — W19XXXA Unspecified fall, initial encounter: Secondary | ICD-10-CM

## 2015-12-19 DIAGNOSIS — Z7952 Long term (current) use of systemic steroids: Secondary | ICD-10-CM | POA: Insufficient documentation

## 2015-12-19 DIAGNOSIS — F0390 Unspecified dementia without behavioral disturbance: Secondary | ICD-10-CM | POA: Insufficient documentation

## 2015-12-19 DIAGNOSIS — S0990XA Unspecified injury of head, initial encounter: Secondary | ICD-10-CM

## 2015-12-19 DIAGNOSIS — S0101XA Laceration without foreign body of scalp, initial encounter: Secondary | ICD-10-CM | POA: Diagnosis not present

## 2015-12-19 DIAGNOSIS — I10 Essential (primary) hypertension: Secondary | ICD-10-CM | POA: Insufficient documentation

## 2015-12-19 LAB — CBG MONITORING, ED: Glucose-Capillary: 287 mg/dL — ABNORMAL HIGH (ref 65–99)

## 2015-12-19 MED ORDER — ACETAMINOPHEN 325 MG PO TABS
650.0000 mg | ORAL_TABLET | Freq: Once | ORAL | Status: AC
  Administered 2015-12-19: 650 mg via ORAL
  Filled 2015-12-19: qty 2

## 2015-12-19 MED ORDER — TETANUS-DIPHTH-ACELL PERTUSSIS 5-2.5-18.5 LF-MCG/0.5 IM SUSP
0.5000 mL | Freq: Once | INTRAMUSCULAR | Status: AC
Start: 2015-12-19 — End: 2015-12-19
  Administered 2015-12-19: 0.5 mL via INTRAMUSCULAR
  Filled 2015-12-19: qty 0.5

## 2015-12-19 NOTE — Discharge Instructions (Signed)
Head Injury, Adult You have a head injury. Headaches and throwing up (vomiting) are common after a head injury. It should be easy to wake up from sleeping. Sometimes you must stay in the hospital. Most problems happen within the first 24 hours. Side effects may occur up to 7-10 days after the injury.  WHAT ARE THE TYPES OF HEAD INJURIES? Head injuries can be as minor as a bump. Some head injuries can be more severe. More severe head injuries include:  A jarring injury to the brain (concussion).  A bruise of the brain (contusion). This mean there is bleeding in the brain that can cause swelling.  A cracked skull (skull fracture).  Bleeding in the brain that collects, clots, and forms a bump (hematoma). WHEN SHOULD I GET HELP RIGHT AWAY?   You are confused or sleepy.  You cannot be woken up.  You feel sick to your stomach (nauseous) or keep throwing up (vomiting).  Your dizziness or unsteadiness is getting worse.  You have very bad, lasting headaches that are not helped by medicine. Take medicines only as told by your doctor.  You cannot use your arms or legs like normal.  You cannot walk.  You notice changes in the black spots in the center of the colored part of your eye (pupil).  You have clear or bloody fluid coming from your nose or ears.  You have trouble seeing. During the next 24 hours after the injury, you must stay with someone who can watch you. This person should get help right away (call 911 in the [place] if you start to shake and are not able to control it (have seizures), you pass out, or you are unable to wake up. HOW CAN I PREVENT A HEAD INJURY IN THE FUTURE?  Wear seat belts.  Wear a helmet while bike riding and playing sports like football.  Stay away from dangerous activities around the house. WHEN CAN I RETURN TO NORMAL ACTIVITIES AND ATHLETICS? See your doctor before doing these activities. You should not do normal activities or play contact sports until 1  week after the following symptoms have stopped:  Headache that does not go away.  Dizziness.  Poor attention.  Confusion.  Memory problems.  Sickness to your stomach or throwing up.  Tiredness.  Fussiness.  Bothered by bright lights or loud noises.  Anxiousness or depression.  Restless sleep. MAKE SURE YOU:   Understand these instructions.  Will watch your condition.  Will get help right away if you are not doing well or get worse.   This information is not intended to replace advice given to you by your health care provider. Make sure you discuss any questions you have with your health care provider.   Document Released: 11/05/2008 Document Revised: 12/14/2014 Document Reviewed: 07/31/2013 Elsevier Interactive Patient Education 2016 [employer].  Emergency Department Resource Guide 1) Find a Doctor and Pay Out of Pocket Although you won't have to find out who is covered by your insurance plan, it is a good idea to ask around and get recommendations. You will then need to call the office and see if the doctor you have chosen will accept you as a new patient and what types of options they offer for patients who are self-pay. Some doctors offer discounts or will set up payment plans for their patients who do not have insurance, but you will need to ask so you aren't surprised when you get to your appointment.  2) Contact Your Local  Health Department Not all health departments have doctors that can see patients for sick visits, but many do, so it is worth a call to see if yours does. If you don't know where your local health department is, you can check in your phone book. The CDC also has a tool to help you locate your state's health department, and many state websites also have listings of all of their local health departments.  3) Find a Walk-in Clinic If your illness is not likely to be very severe or complicated, you may want to try a walk in clinic. These are popping  up all over the country in pharmacies, drugstores, and shopping centers. They're usually staffed by nurse practitioners or physician assistants that have been trained to treat common illnesses and complaints. They're usually fairly quick and inexpensive. However, if you have serious medical issues or chronic medical problems, these are probably not your best option.  No Primary Care Doctor: - Call Health Connect at  [phone] - they can help you locate a primary care doctor that  accepts your insurance, provides certain services, etc. - Physician Referral Service- [phone]  Chronic Pain Problems: Organization         Address  Phone   Notes  [facility]  [phone] Patients need to be referred by their primary care doctor.   Medication Assistance: Organization         Address  Phone   Notes  [facility] Medication [facility] [address] [place], [place] [address] [phone] --Must be a resident of [facility] -- Must have NO insurance coverage whatsoever (no Medicaid/ Medicare, etc.) -- The pt. MUST have a primary care doctor that directs their care regularly and follows them in the community   [facility]  [phone]   [employer]  [phone]    Agencies that provide inexpensive medical care: Organization         Address  Phone   Notes  [facility]  [phone]   [facility]    [phone]   [facility] [address] [place], [place] [address] [phone]   [facility] [address], [place] [phone]   Planned Parenthood    [phone]   [facility]    [phone]   Community Health and [facility]  [address], [place] Phone:  [phone], Fax:  [phone] Hours of Operation:  9 am - 6 pm, M-F.  Also accepts Medicaid/Medicare and self-pay.  [facility]  [address], [place] Phone: [phone], Fax: [phone]. Hours of Operation:  8:30 am - 5:30 pm, M-F.  Also accepts Medicaid and self-pay.  [facility] [place] [address], [place] Phone: [phone]   [facility] [address] Natasha Bence [place], [place] [phone], Ext. [phone] Mondays & Thursdays: 7-9 AM.  First 15 patients are seen on a first come, first serve basis.    Medicaid-accepting [facility] Providers:  Organization         Address  Phone   Notes  [facility] [address], [place] [phone] Also accepts self-pay patients.  [facility] [address] Laurell Josephs [place], [place]  [phone]   [facility] [address], [place] [phone]  [facility] [address] [place] [phone]   Renaye Rakers [address], [place]   [phone] Only accepts [place] patients after they have their name applied to their card.   Self-Pay (no insurance) in [facility]:  Organization         Address  Phone   Notes  Sickle Cell Patients, [facility] [address] [place], [place] [phone]   [facility] [address] [place], [place] [phone]   [facility] [place]  [address], [place] [phone]   [facility]/Dr. Osei-Bonsu  [address] [place] or [address], [place] [phone] Phone number for both [place] and [place] locations is the same.  Urgent Medical and [facility] [address], [place] [phone]   [facility] [address] [place] or [address] [phone]   [facility] [address] [place] [phone], phone; [phone], fax Sees patients 1st and 3rd Saturday of  every month.  Must not qualify for public or private insurance (i.e. Medicaid, Medicare, [place] Health Choice, Veterans' Benefits)  Household income should be no more than 200% of the poverty level The clinic cannot treat you if you are pregnant or think you are pregnant  Sexually transmitted diseases are not treated at the clinic.    Dental Care: Organization         Address  Phone  Notes  [facility] Department of [facility] [address] [place], [place] [phone] Accepts children up to age 68 who are enrolled in [place] or [place] Health Choice; pregnant women with a Medicaid card; and children who have applied for Medicaid or [place] Health Choice, but were declined, whose parents can pay a reduced fee at time of service.  [facility] Department of [facility]  [address], [place] [phone] Accepts children up to age 71 who are enrolled in [place] or [place] Health Choice; pregnant women with a Medicaid card; and children who have applied for Medicaid or  Health Choice, but were declined, whose parents can pay a reduced fee at time of service.  [facility]  [address] [place], [place] [phone] Patients are seen by appointment only. Walk-ins are not accepted. [facility] will see patients 33 years of age and older. Monday - Tuesday (8am-5pm) Most Wednesdays (8:30-5pm) $30 per visit, cash only  [facility] Adult Dental Access PRO[facility]  [address], [facility] [place] [phone] Patients are seen by appointment only. Walk-ins are not accepted. [facility] will see patients 38 years of age and older. One Wednesday Evening (Monthly: Volunteer Based).  $30 per visit, cash only  [employer]  [phone] for adults; Children under age 96, call Graduate Pediatric Dentistry at [phone]. Children aged 43-14, please call [phone] to request a pediatric application.  Dental  services are provided in all areas of dental care including fillings, crowns and bridges, complete and partial dentures, implants, gum treatment, root canals, and extractions. Preventive care is also provided. Treatment is provided to both adults and children. Patients are selected via a lottery and there is often a waiting list.   [facility] [address] [place]  [phone] [email or web address]  [facility] [address], [place], [place] 914 137 [phone], Ext. [phone] Second and Fourth Thursday of each month, opens at 6:30 AM; Clinic ends at 9 AM.  Patients are seen on a first-come first-served basis, and a limited number are seen during each clinic.   [facility]  [address], [place], [place] [phone]   Eligibility Requirements You must have lived in [place], [place], or [place] counties for at least the last three months.   You cannot be eligible for state or federal sponsored [place]healthcare insurance, including CIGNAVeterans Administration, [place]icaid, or [employer]icare.   You generally cannot be eligible for healthcare insurance through your employer.    How to apply: Eligibility screenings are held every Tuesday and Wednesday afternoon from 1:00 pm until 4:00 pm. You do not need an appointment for the interview!  [facility] [address], [place], [place] [phone]   [facility]  [phone]   [facility]  [phone]   [facility]  [phone]    Behavioral Health Resources in the Community: Intensive Outpatient Programs Organization         Address  Phone  Notes  [facility] [address], [place], [place] [phone]   [facility] Outpatient [address], [place], [place] [phone]   [facility] [address], [place], [place]  [phone]   [facility] [address],    [place], [place] [phone] or [phone]   Substance Abuse Resources Organization         Address  Phone  Notes  [facility]  [phone]   [facility]  [phone]   [facility]  [phone]   [facility]  [phone]   Residential & Outpatient Substance Abuse Program  [phone]   Psychological Services Organization         Address  Phone  Notes  [facility]  [phone]- [phone]   [facility]  [phone]- [phone]   [facility] [address], [place] [phone] or [phone]    Mobile Crisis Teams Organization         Address  Phone  Notes  [facility], Mobile Crisis Care Unit  [phone]   [facility]  [address] [place], [place] [phone]   Doristine LocksSharon DeEsch [address] [place] [phone]    Self-Help/Support Groups Organization         Address  Phone             Notes  [facility] [place] - variety of support groups  336- [phone] Call for more information  Narcotics Anonymous (NA), [facility] [address], [place]  2 meetings at this location   Statisticianesidential Treatment Programs Organization         Address  Phone  Notes  [facility] [address],    [place]  [phone]   [facility]  [address], [place] [phone], [place], [place] [phone]   [facility] [address], [place] [phone] Admissions: 8am-3pm M-F  [facility] [address]    [place], [place] [phone]   [facility] [address] [place] [address], [place], [place] [phone]   [facility] [address]  [place], [place] [phone]   [facility] - Intensive Outpatient [phone] Laurell Josephs [address], [place], [place] [phone]   [facility] [address] [place]  [place], [place]  [phone] or [phone]   [facility] ([facility]) [address] [place], [place] [phone] Accepts Medicaid  [facility] [address]  [place] [phone] Substance Abuse/Addiction Treatment   [facility] Organization         Address  Phone  Notes  [facility]  [phone]   Angie Fava, PhD [address] Ervin Knack [place], [place]   [phone] or [phone]   [facility]   [address] [place], [place] [phone]   [facility] [address] [place], [place] [phone] Insurance/Medicaid/sponsorship through [facility] and Families [address]                                    [place], [place] [phone] Therapy/tele-psych/case  [facility] [address], [place] [phone]    Dr. Lolly Mustache  [phone]   [facility]  [employer] [facility] Dept. 1) [address] [place] 2) [address], [place] 3)  [address], [place] [phone]  [phone]   [facility] Child Abuse Hotline (517)033-6289 or [phone] (After Hours)

## 2015-12-19 NOTE — ED Notes (Signed)
Pt here with daughter and lives her son. Pt has dementia and had gone to the bathroom and slipped and fell hitting her head. As soon as she fell her son was at her side. No LOC.

## 2015-12-19 NOTE — ED Notes (Signed)
CBG [id number]  

## 2015-12-19 NOTE — ED Provider Notes (Signed)
CSN: [id number]     Arrival date & time 12/19/15  0058 History   First MD Initiated Contact with Patient 12/19/15 [phone]     Chief Complaint  Patient presents with  . Fall  . Head Laceration   (Consider location/radiation/quality/duration/timing/severity/associated sxs/prior Treatment) Patient is a 75 y.o. female presenting with fall and scalp laceration. The history is provided by the patient. No language interpreter was used.  Fall  Head Laceration  Level V caveat Ms. Zuver is a 75 year old female with a past medical history of hypertension, diabetes, temporal arteritis, and dementia who presents with her daughter for a slip and fall while in the bathroom. Her daughter states that she went to get her a pull-up while she was in the bathroom and she slipped and fell hitting the back of her head. She has a small laceration to the back of her head. She denies any loss of consciousness. She denies being on any anticoagulation medication. She is requesting some Tylenol for her mom.  Past Medical History  Diagnosis Date  . Diabetes mellitus   . Hypertension   . Temporal arteritis (HCC)   . Dementia    History reviewed. No pertinent past surgical history. No family history on file. Social History  Substance Use Topics  . Smoking status: Never Smoker   . Smokeless tobacco: None  . Alcohol Use: No   OB History    No data available     Review of Systems  Unable to perform ROS: Dementia   Level V caveat.  Allergies  Review of patient's allergies indicates no known allergies.  Home Medications   Prior to Admission medications   Medication Sig Start Date End Date Taking? Authorizing Provider  diltiazem (TIAZAC) 240 MG 24 hr capsule Take 240 mg by mouth daily. 05/30/15   Historical Provider, MD  donepezil (ARICEPT) 10 MG tablet Take 10 mg by mouth daily.    Historical Provider, MD  insulin detemir (LEVEMIR) 100 UNIT/ML injection Inject 0.1 mLs (10 Units total) into the skin 2  (two) times daily. 07/06/15   Costin Otelia Sergeant, MD  lisinopril (PRINIVIL,ZESTRIL) 40 MG tablet Take 40 mg by mouth daily.      Historical Provider, MD  metFORMIN (GLUCOPHAGE) 1000 MG tablet Take 1,000 mg by mouth daily with breakfast.    Historical Provider, MD  nebivolol (BYSTOLIC) 5 MG tablet Take 5 mg by mouth daily.    Historical Provider, MD  prednisoLONE acetate (PRED FORTE) 1 % ophthalmic suspension Place 1 drop into both eyes 4 (four) times daily.    Historical Provider, MD  predniSONE (DELTASONE) 1 MG tablet Take 9 mg by mouth daily. For 3 weeks starting 06-30-15, then decrease by 1 tablet every 3 weeks as directed. 06/30/15   Historical Provider, MD  sertraline (ZOLOFT) 50 MG tablet Take 50 mg by mouth daily.      Historical Provider, MD  zolpidem (AMBIEN) 10 MG tablet Take 10 mg by mouth at bedtime.  05/31/15   Historical Provider, MD   BP 164/98 mmHg  Pulse 93  Temp(Src) 98.8 F (37.1 C) (Oral)  Resp 14  SpO2 97% Physical Exam  Constitutional: She appears well-developed and well-nourished. No distress.  HENT:  Head: Normocephalic. Head is without raccoon's eyes, without Battle's sign, without right periorbital erythema and without left periorbital erythema.  No raccoon eyes or battle signs.  Eyes: Conjunctivae are normal.  Neck: Normal range of motion. Neck supple.  Cardiovascular: Normal rate, regular rhythm and normal heart sounds.  Pulmonary/Chest: Effort normal and breath sounds normal.  Abdominal: Soft. There is no tenderness.  Musculoskeletal: Normal range of motion.  Neurological: She is alert. She has normal strength.  Oriented to person. Dementia at baseline. Able to move all extremities without difficulty.  Skin: Skin is warm and dry.  Small 2 cm superficial laceration to the posterior scalp. No active bleeding. No bone could be visualized.  Nursing note and vitals reviewed.   ED Course  Procedures (including critical care time) Labs Review Labs Reviewed  CBG  MONITORING, ED - Abnormal; Notable for the following:    Glucose-Capillary 287 (*)    All other components within normal limits    Imaging Review Ct Head Wo Contrast  12/19/2015  CLINICAL DATA:  Follow.  Head injury. EXAM: CT HEAD WITHOUT CONTRAST CT CERVICAL SPINE WITHOUT CONTRAST TECHNIQUE: Multidetector CT imaging of the head and cervical spine was performed following the standard protocol without intravenous contrast. Multiplanar CT image reconstructions of the cervical spine were also generated. COMPARISON:  07/04/2015 FINDINGS: CT HEAD FINDINGS Global atrophy, hydrocephalus, and chronic ischemic changes in the periventricular white matter are stable. Sella turcica remains somewhat expanded. No mass effect, midline shift, or acute hemorrhage. Mastoid air cells are clear. Intact cranium. CT CERVICAL SPINE FINDINGS There is a small fracture fragment related to a fracture through the tip of the odontoid. This is a stable finding and was noted on the prior study dated 07/04/2015. This likely represents a chronic avulsed fracture. The small fracture fragment is 3 x 5 mm. There is 3 mm of distraction. There is otherwise no evidence of acute fracture or dislocation. Great vessels tract. There is no obvious soft tissue injury. Thyroid is unremarkable. 4 mm nodule in the left upper lobe on image 67. There is disc space narrowing at C4-5, C5-6, C6-7, and C7-T1. Posterior osteophytic ridging occurs at various levels. Bilateral uncovertebral osteophytes encroach upon the C5-6 and C6-7 foramina. IMPRESSION: No acute intracranial pathology. No acute cervical spine injury. Chronic changes are noted above. Electronically Signed   By: Jolaine ClickArthur  Hoss M.D.   On: 12/19/2015 07:16   Ct Cervical Spine Wo Contrast  12/19/2015  CLINICAL DATA:  Follow.  Head injury. EXAM: CT HEAD WITHOUT CONTRAST CT CERVICAL SPINE WITHOUT CONTRAST TECHNIQUE: Multidetector CT imaging of the head and cervical spine was performed following the  standard protocol without intravenous contrast. Multiplanar CT image reconstructions of the cervical spine were also generated. COMPARISON:  07/04/2015 FINDINGS: CT HEAD FINDINGS Global atrophy, hydrocephalus, and chronic ischemic changes in the periventricular white matter are stable. Sella turcica remains somewhat expanded. No mass effect, midline shift, or acute hemorrhage. Mastoid air cells are clear. Intact cranium. CT CERVICAL SPINE FINDINGS There is a small fracture fragment related to a fracture through the tip of the odontoid. This is a stable finding and was noted on the prior study dated 07/04/2015. This likely represents a chronic avulsed fracture. The small fracture fragment is 3 x 5 mm. There is 3 mm of distraction. There is otherwise no evidence of acute fracture or dislocation. Great vessels tract. There is no obvious soft tissue injury. Thyroid is unremarkable. 4 mm nodule in the left upper lobe on image 67. There is disc space narrowing at C4-5, C5-6, C6-7, and C7-T1. Posterior osteophytic ridging occurs at various levels. Bilateral uncovertebral osteophytes encroach upon the C5-6 and C6-7 foramina. IMPRESSION: No acute intracranial pathology. No acute cervical spine injury. Chronic changes are noted above. Electronically Signed   By: Merton BorderArthur  Hoss M.D.   On: 12/19/2015 07:16   I have personally reviewed and evaluated these images and lab results as part of my medical decision-making.   EKG Interpretation None      MDM   Final diagnoses:  Fall, initial encounter  Laceration of head, initial encounter  Head injury, initial encounter   Patient presents with daughter for slip and fall. She hit her head and has a superficial laceration to the posterior scalp. She is well-appearing and at baseline according to daughter. She is not on any anticoagulation medication. She is moving all extremities and A and O 1. Laceration of the back of the head is superficial and does not require suturing  or staples. Vital signs are stable.  CT head and neck were ordered.  CT scan of head and neck show no acute finding. There is old fractures noted. Daughter feels comfortable taking patient home. I discussed return precautions with the daughter as well as follow-up and she verbally agrees with plan. Filed Vitals:   12/19/15 0114 12/19/15 0559  BP: 150/81 164/98  Pulse: 84 93  Temp: 98.8 F (37.1 C)   Resp: 16 14   Medications  Tdap (BOOSTRIX) injection 0.5 mL (0.5 mLs Intramuscular Given 12/19/15 0728)  acetaminophen (TYLENOL) tablet 650 mg (650 mg Oral Given 12/19/15 0723)       Catha Gosselin, PA-C 12/19/15 0732  Layla Maw Ward, DO 12/19/15 0740

## 2015-12-29 ENCOUNTER — Emergency Department (HOSPITAL_COMMUNITY): Payer: Medicare Other

## 2015-12-29 ENCOUNTER — Emergency Department (HOSPITAL_COMMUNITY)
Admission: EM | Admit: 2015-12-29 | Discharge: 2015-12-29 | Disposition: A | Discharge status: Home / Self Care | Payer: Medicare Other | Attending: Emergency Medicine | Admitting: Emergency Medicine

## 2015-12-29 ENCOUNTER — Encounter (HOSPITAL_COMMUNITY): Payer: Self-pay

## 2015-12-29 DIAGNOSIS — Z7952 Long term (current) use of systemic steroids: Secondary | ICD-10-CM | POA: Insufficient documentation

## 2015-12-29 DIAGNOSIS — F0390 Unspecified dementia without behavioral disturbance: Secondary | ICD-10-CM | POA: Insufficient documentation

## 2015-12-29 DIAGNOSIS — M79673 Pain in unspecified foot: Secondary | ICD-10-CM | POA: Diagnosis not present

## 2015-12-29 DIAGNOSIS — Z79899 Other long term (current) drug therapy: Secondary | ICD-10-CM | POA: Insufficient documentation

## 2015-12-29 DIAGNOSIS — M25571 Pain in right ankle and joints of right foot: Secondary | ICD-10-CM | POA: Diagnosis not present

## 2015-12-29 DIAGNOSIS — I10 Essential (primary) hypertension: Secondary | ICD-10-CM | POA: Insufficient documentation

## 2015-12-29 DIAGNOSIS — M25559 Pain in unspecified hip: Secondary | ICD-10-CM | POA: Diagnosis not present

## 2015-12-29 DIAGNOSIS — M79604 Pain in right leg: Secondary | ICD-10-CM | POA: Diagnosis not present

## 2015-12-29 DIAGNOSIS — Z7984 Long term (current) use of oral hypoglycemic drugs: Secondary | ICD-10-CM | POA: Insufficient documentation

## 2015-12-29 DIAGNOSIS — M79669 Pain in unspecified lower leg: Secondary | ICD-10-CM | POA: Diagnosis present

## 2015-12-29 DIAGNOSIS — M25572 Pain in left ankle and joints of left foot: Secondary | ICD-10-CM | POA: Diagnosis not present

## 2015-12-29 DIAGNOSIS — Z794 Long term (current) use of insulin: Secondary | ICD-10-CM | POA: Insufficient documentation

## 2015-12-29 DIAGNOSIS — E119 Type 2 diabetes mellitus without complications: Secondary | ICD-10-CM | POA: Insufficient documentation

## 2015-12-29 DIAGNOSIS — S99912A Unspecified injury of left ankle, initial encounter: Secondary | ICD-10-CM | POA: Diagnosis not present

## 2015-12-29 DIAGNOSIS — M79605 Pain in left leg: Secondary | ICD-10-CM | POA: Diagnosis not present

## 2015-12-29 DIAGNOSIS — S99911A Unspecified injury of right ankle, initial encounter: Secondary | ICD-10-CM | POA: Diagnosis not present

## 2015-12-29 LAB — BASIC METABOLIC PANEL
ANION GAP: 10 (ref 5–15)
BUN: 12 mg/dL (ref 6–20)
CALCIUM: 9.2 mg/dL (ref 8.9–10.3)
CO2: 28 mmol/L (ref 22–32)
Chloride: 102 mmol/L (ref 101–111)
Creatinine, Ser: 0.73 mg/dL (ref 0.44–1.00)
Glucose, Bld: 310 mg/dL — ABNORMAL HIGH (ref 65–99)
Potassium: 3.8 mmol/L (ref 3.5–5.1)
SODIUM: 140 mmol/L (ref 135–145)

## 2015-12-29 LAB — URINE MICROSCOPIC-ADD ON

## 2015-12-29 LAB — CBC
HEMATOCRIT: 34 % — AB (ref 36.0–46.0)
Hemoglobin: 11.2 g/dL — ABNORMAL LOW (ref 12.0–15.0)
MCH: 29.8 pg (ref 26.0–34.0)
MCHC: 32.9 g/dL (ref 30.0–36.0)
MCV: 90.4 fL (ref 78.0–100.0)
Platelets: 198 10*3/uL (ref 150–400)
RBC: 3.76 MIL/uL — ABNORMAL LOW (ref 3.87–5.11)
RDW: 13.6 % (ref 11.5–15.5)
WBC: 6.3 10*3/uL (ref 4.0–10.5)

## 2015-12-29 LAB — URINALYSIS, ROUTINE W REFLEX MICROSCOPIC
Bilirubin Urine: NEGATIVE
Glucose, UA: 500 mg/dL — AB
Hgb urine dipstick: NEGATIVE
Ketones, ur: NEGATIVE mg/dL
Leukocytes, UA: NEGATIVE
Nitrite: POSITIVE — AB
PROTEIN: NEGATIVE mg/dL
Specific Gravity, Urine: 1.016 (ref 1.005–1.030)
pH: 7.5 (ref 5.0–8.0)

## 2015-12-29 MED ORDER — TRAMADOL HCL 50 MG PO TABS: 50.0000 mg | ORAL_TABLET | Freq: Four times a day (QID) | ORAL | Status: DC | PRN

## 2015-12-29 MED ORDER — TRAMADOL HCL 50 MG PO TABS
50.0000 mg | ORAL_TABLET | Freq: Once | ORAL | Status: AC
  Administered 2015-12-29: 50 mg via ORAL
  Filled 2015-12-29: qty 1

## 2015-12-29 NOTE — ED Notes (Signed)
Patient transported to X-ray 

## 2015-12-29 NOTE — ED Notes (Signed)
Patient presents to the ED by EMS after having BLE pain and difficulty ambulating. Patient has had pains since Wednesday. Patient's family reports this is new. Patient lives at home with family.

## 2015-12-29 NOTE — Discharge Instructions (Signed)
xrays are normal - blood work was normal other than elevated blood sugar   Please obtain all of your results from medical records or have your doctors office obtain the results - share them with your doctor - you should be seen at your doctors office in the next 2 days. Call today to arrange your follow up. Take the medications as prescribed. Please review all of the medicines and only take them if you do not have an allergy to them. Please be aware that if you are taking birth control pills, taking other prescriptions, ESPECIALLY ANTIBIOTICS may make the birth control ineffective - if this is the case, either do not engage in sexual activity or use alternative methods of birth control such as condoms until you have finished the medicine and your family doctor says it is OK to restart them. If you are on a blood thinner such as COUMADIN, be aware that any other medicine that you take may cause the coumadin to either work too much, or not enough - you should have your coumadin level rechecked in next 7 days if this is the case.  ?  It is also a possibility that you have an allergic reaction to any of the medicines that you have been prescribed - Everybody reacts differently to medications and while MOST people have no trouble with most medicines, you may have a reaction such as nausea, vomiting, rash, swelling, shortness of breath. If this is the case, please stop taking the medicine immediately and contact your physician.  ?  You should return to the ER if you develop severe or worsening symptoms.

## 2015-12-29 NOTE — ED Notes (Signed)
Patient returned from xray.

## 2015-12-29 NOTE — ED Notes (Signed)
Bed: NW29 Expected date:  Expected time:  Means of arrival:  Comments: EMS- 75 yo, difficulty walking x4 days; no injury

## 2015-12-29 NOTE — ED Provider Notes (Signed)
CSN: [id number]     Arrival date & time 12/29/15  1827 History   First MD Initiated Contact with Patient 12/29/15 1832     Chief Complaint  Patient presents with  . Leg Pain     (Consider location/radiation/quality/duration/timing/severity/associated sxs/prior Treatment) HPI Comments: The patient is a 75 show female, she has a history of dementia, level V caveat applies secondary to inability for the patient to give any information. The history is obtained from the paramedics as well as her daughters who present after the patient arrived. They state that over the last 4 days she has had some increased difficulty walking stating that it hurts, she is unable to say what hurts, there is been no falls or trauma, she has had some increased urinary frequency.  Patient is a 75 y.o. female presenting with leg pain. The history is provided by the patient.  Leg Pain   Past Medical History  Diagnosis Date  . Diabetes mellitus   . Hypertension   . Temporal arteritis (HCC)   . Dementia    History reviewed. No pertinent past surgical history. History reviewed. No pertinent family history. Social History  Substance Use Topics  . Smoking status: Never Smoker   . Smokeless tobacco: None  . Alcohol Use: No   OB History    No data available     Review of Systems  Unable to perform ROS: Dementia      Allergies  Review of patient's allergies indicates no known allergies.  Home Medications   Prior to Admission medications   Medication Sig Start Date End Date Taking? Authorizing Provider  diltiazem (TIAZAC) 240 MG 24 hr capsule Take 240 mg by mouth daily. 05/30/15  Yes Historical Provider, MD  donepezil (ARICEPT) 10 MG tablet Take 10 mg by mouth daily.   Yes Historical Provider, MD  glipiZIDE (GLUCOTROL) 5 MG tablet Take 5 mg by mouth daily. Reported on 12/29/2015 12/11/15  Yes Historical Provider, MD  insulin detemir (LEVEMIR) 100 UNIT/ML injection Inject 0.1 mLs (10 Units total) into the  skin 2 (two) times daily. 07/06/15  Yes Costin Otelia Sergeant, MD  lisinopril (PRINIVIL,ZESTRIL) 40 MG tablet Take 40 mg by mouth daily.     Yes Historical Provider, MD  metFORMIN (GLUCOPHAGE) 1000 MG tablet Take 1,000 mg by mouth daily with breakfast.   Yes Historical Provider, MD  nebivolol (BYSTOLIC) 5 MG tablet Take 5 mg by mouth daily.   Yes Historical Provider, MD  predniSONE (DELTASONE) 1 MG tablet Take 1 mg by mouth daily.  06/30/15  Yes Historical Provider, MD  sertraline (ZOLOFT) 50 MG tablet Take 50 mg by mouth at bedtime.    Yes Historical Provider, MD  zolpidem (AMBIEN) 10 MG tablet Take 10 mg by mouth at bedtime.  05/31/15  Yes Historical Provider, MD   BP 135/57 mmHg  Pulse 62  Temp(Src) 98.4 F (36.9 C) (Oral)  Resp 18  SpO2 97% Physical Exam  Constitutional: She appears well-developed and well-nourished. No distress.  HENT:  Head: Normocephalic and atraumatic.  Mouth/Throat: Oropharynx is clear and moist. No oropharyngeal exudate.  Eyes: Conjunctivae and EOM are normal. Pupils are equal, round, and reactive to light. Right eye exhibits no discharge. Left eye exhibits no discharge. No scleral icterus.  Neck: Normal range of motion. Neck supple. No JVD present. No thyromegaly present.  Cardiovascular: Normal rate, regular rhythm, normal heart sounds and intact distal pulses.  Exam reveals no gallop and no friction rub.   No murmur heard. Pulmonary/Chest:  Effort normal and breath sounds normal. No respiratory distress. She has no wheezes. She has no rales.  Abdominal: Soft. Bowel sounds are normal. She exhibits no distension and no mass. There is no tenderness.  Musculoskeletal: Normal range of motion. She exhibits no edema or tenderness.  No tenderness of the bilateral hips, knees, ankles or feet, normal range of motion of the hips and knees ankles and feet, normal pulses at the feet bilaterally, soft compartments  Lymphadenopathy:    She has no cervical adenopathy.  Neurological:  She is alert. Coordination normal.  Skin: Skin is warm and dry. No rash noted. No erythema.  Psychiatric: She has a normal mood and affect. Her behavior is normal.  Nursing note and vitals reviewed.   ED Course  Procedures (including critical care time) Labs Review Labs Reviewed  URINE CULTURE  URINALYSIS, ROUTINE W REFLEX MICROSCOPIC (NOT AT [facility])  CBC  BASIC METABOLIC PANEL    Imaging Review No results found. I have personally reviewed and evaluated these images and lab results as part of my medical decision-making.    MDM   Final diagnoses:  None    The patient has vital signs which were unremarkable Image bones / check labs and UA - unremarkable physical.  Family in agreement.  xrays neg, labs normla other than mild elevateion in CBG - no DKA - VS normal - stable for d/c.  In addition to written d/c instructions, the pt was given verbal d/c instructions including the indications for return and expressed understanding to the instructions.  Meds given in ED:  Medications - No data to display  New Prescriptions   TRAMADOL (ULTRAM) 50 MG TABLET    Take 1 tablet (50 mg total) by mouth every 6 (six) hours as needed.      Eber Hong, MD 12/29/15 2122

## 2015-12-31 LAB — URINE CULTURE
Culture: NO GROWTH
Special Requests: NORMAL

## 2016-01-28 DIAGNOSIS — I10 Essential (primary) hypertension: Secondary | ICD-10-CM | POA: Diagnosis not present

## 2016-01-28 DIAGNOSIS — E118 Type 2 diabetes mellitus with unspecified complications: Secondary | ICD-10-CM | POA: Diagnosis not present

## 2016-01-28 DIAGNOSIS — F0390 Unspecified dementia without behavioral disturbance: Secondary | ICD-10-CM | POA: Diagnosis not present

## 2016-05-16 ENCOUNTER — Encounter (HOSPITAL_COMMUNITY): Payer: Self-pay | Admitting: Emergency Medicine

## 2016-05-16 ENCOUNTER — Inpatient Hospital Stay (HOSPITAL_COMMUNITY)
Admission: EM | Admit: 2016-05-16 | Discharge: 2016-05-19 | DRG: 689 | Disposition: A | Discharge status: Home Health | Payer: Medicare Other | Attending: Internal Medicine | Admitting: Internal Medicine

## 2016-05-16 DIAGNOSIS — F0390 Unspecified dementia without behavioral disturbance: Secondary | ICD-10-CM | POA: Diagnosis present

## 2016-05-16 DIAGNOSIS — Z8249 Family history of ischemic heart disease and other diseases of the circulatory system: Secondary | ICD-10-CM | POA: Diagnosis not present

## 2016-05-16 DIAGNOSIS — N390 Urinary tract infection, site not specified: Secondary | ICD-10-CM | POA: Diagnosis not present

## 2016-05-16 DIAGNOSIS — M316 Other giant cell arteritis: Secondary | ICD-10-CM | POA: Diagnosis not present

## 2016-05-16 DIAGNOSIS — IMO0001 Reserved for inherently not codable concepts without codable children: Secondary | ICD-10-CM | POA: Insufficient documentation

## 2016-05-16 DIAGNOSIS — E870 Hyperosmolality and hypernatremia: Secondary | ICD-10-CM | POA: Diagnosis present

## 2016-05-16 DIAGNOSIS — R739 Hyperglycemia, unspecified: Secondary | ICD-10-CM | POA: Insufficient documentation

## 2016-05-16 DIAGNOSIS — G9340 Encephalopathy, unspecified: Secondary | ICD-10-CM | POA: Diagnosis not present

## 2016-05-16 DIAGNOSIS — E43 Unspecified severe protein-calorie malnutrition: Secondary | ICD-10-CM | POA: Diagnosis not present

## 2016-05-16 DIAGNOSIS — Z23 Encounter for immunization: Secondary | ICD-10-CM | POA: Diagnosis not present

## 2016-05-16 DIAGNOSIS — Z794 Long term (current) use of insulin: Secondary | ICD-10-CM

## 2016-05-16 DIAGNOSIS — E860 Dehydration: Secondary | ICD-10-CM | POA: Diagnosis not present

## 2016-05-16 DIAGNOSIS — I10 Essential (primary) hypertension: Secondary | ICD-10-CM | POA: Diagnosis not present

## 2016-05-16 DIAGNOSIS — Z833 Family history of diabetes mellitus: Secondary | ICD-10-CM

## 2016-05-16 DIAGNOSIS — D696 Thrombocytopenia, unspecified: Secondary | ICD-10-CM | POA: Diagnosis present

## 2016-05-16 DIAGNOSIS — E11649 Type 2 diabetes mellitus with hypoglycemia without coma: Secondary | ICD-10-CM | POA: Diagnosis not present

## 2016-05-16 DIAGNOSIS — Z87440 Personal history of urinary (tract) infections: Secondary | ICD-10-CM

## 2016-05-16 DIAGNOSIS — Z7952 Long term (current) use of systemic steroids: Secondary | ICD-10-CM

## 2016-05-16 DIAGNOSIS — N3000 Acute cystitis without hematuria: Principal | ICD-10-CM | POA: Diagnosis present

## 2016-05-16 DIAGNOSIS — Z79899 Other long term (current) drug therapy: Secondary | ICD-10-CM | POA: Diagnosis not present

## 2016-05-16 DIAGNOSIS — Z681 Body mass index (BMI) 19 or less, adult: Secondary | ICD-10-CM | POA: Diagnosis not present

## 2016-05-16 DIAGNOSIS — R4182 Altered mental status, unspecified: Secondary | ICD-10-CM | POA: Diagnosis not present

## 2016-05-16 DIAGNOSIS — R402411 Glasgow coma scale score 13-15, in the field [EMT or ambulance]: Secondary | ICD-10-CM | POA: Diagnosis not present

## 2016-05-16 DIAGNOSIS — E1165 Type 2 diabetes mellitus with hyperglycemia: Secondary | ICD-10-CM | POA: Diagnosis not present

## 2016-05-16 DIAGNOSIS — E119 Type 2 diabetes mellitus without complications: Secondary | ICD-10-CM

## 2016-05-16 LAB — CBG MONITORING, ED: Glucose-Capillary: 485 mg/dL — ABNORMAL HIGH (ref 65–99)

## 2016-05-16 MED ORDER — SODIUM CHLORIDE 0.9 % IV BOLUS (SEPSIS)
500.0000 mL | Freq: Once | INTRAVENOUS | Status: AC
  Administered 2016-05-17: 500 mL via INTRAVENOUS

## 2016-05-16 NOTE — ED Notes (Signed)
Patient comes from home. Family states that patient is getting more confused. Patient [id number] per EMS. Patient has a Hx of dementia.

## 2016-05-16 NOTE — ED Notes (Signed)
Bed: EA54WA05 Expected date:  Expected time:  Means of arrival:  Comments: EMS 74yo F altered LOC / hyperglycemia

## 2016-05-16 NOTE — ED Provider Notes (Signed)
CSN: [id number]     Arrival date & time 05/16/16  2318 History   First MD Initiated Contact with Patient 05/16/16 2329     Chief Complaint  Patient presents with  . Altered Mental Status     (Consider location/radiation/quality/duration/timing/severity/associated sxs/prior Treatment) HPI Comments: Patient with history of diabetes, dementia, lives at home with family. Patient typically able to participate with some ADLs, however was much less interactive and energetic today. Blood sugars were "high" at home prompting emergency department evaluation. Level V caveat due to dementia. Patient has history of UTI's. No cough or fever reported per daughter.   The history is provided by medical records and a relative.    Past Medical History  Diagnosis Date  . Diabetes mellitus   . Hypertension   . Temporal arteritis (HCC)   . Dementia    No past surgical history on file. No family history on file. Social History  Substance Use Topics  . Smoking status: Never Smoker   . Smokeless tobacco: Not on file  . Alcohol Use: No   OB History    No data available     Review of Systems  Unable to perform ROS: Dementia      Allergies  Review of patient's allergies indicates no known allergies.  Home Medications   Prior to Admission medications   Medication Sig Start Date End Date Taking? Authorizing Provider  diltiazem (TIAZAC) 240 MG 24 hr capsule Take 240 mg by mouth daily. 05/30/15   Historical Provider, MD  donepezil (ARICEPT) 10 MG tablet Take 10 mg by mouth daily.    Historical Provider, MD  glipiZIDE (GLUCOTROL) 5 MG tablet Take 5 mg by mouth daily. Reported on 12/29/2015 12/11/15   Historical Provider, MD  insulin detemir (LEVEMIR) 100 UNIT/ML injection Inject 0.1 mLs (10 Units total) into the skin 2 (two) times daily. 07/06/15   Costin Otelia SergeantM Gherghe, MD  lisinopril (PRINIVIL,ZESTRIL) 40 MG tablet Take 40 mg by mouth daily.      Historical Provider, MD  metFORMIN (GLUCOPHAGE) 1000 MG  tablet Take 1,000 mg by mouth daily with breakfast.    Historical Provider, MD  nebivolol (BYSTOLIC) 5 MG tablet Take 5 mg by mouth daily.    Historical Provider, MD  predniSONE (DELTASONE) 1 MG tablet Take 1 mg by mouth daily.  06/30/15   Historical Provider, MD  sertraline (ZOLOFT) 50 MG tablet Take 50 mg by mouth at bedtime.     Historical Provider, MD  traMADol (ULTRAM) 50 MG tablet Take 1 tablet (50 mg total) by mouth every 6 (six) hours as needed. 12/29/15   Eber HongBrian Miller, MD  zolpidem (AMBIEN) 10 MG tablet Take 10 mg by mouth at bedtime.  05/31/15   Historical Provider, MD   BP 155/82 mmHg  Pulse 81  Temp(Src) 99 F (37.2 C) (Oral)  Resp 19  Ht 5\' 3"  (1.6 m)  SpO2 98%   Physical Exam  Constitutional: She appears well-developed and well-nourished.  HENT:  Head: Normocephalic and atraumatic.  Mouth/Throat: Oropharynx is clear and moist.  Eyes: Conjunctivae are normal. Right eye exhibits no discharge. Left eye exhibits no discharge.  Neck: Normal range of motion. Neck supple.  Cardiovascular: Normal rate, regular rhythm and normal heart sounds.   Pulmonary/Chest: Effort normal and breath sounds normal. No respiratory distress. She has no wheezes. She has no rales.  Abdominal: Soft. There is no tenderness.  Neurological: She is alert.  Skin: Skin is warm and dry.  Psychiatric: She has a normal  mood and affect.  Nursing note and vitals reviewed.   ED Course  Procedures (including critical care time) Labs Review Labs Reviewed  COMPREHENSIVE METABOLIC PANEL - Abnormal; Notable for the following:    Sodium 147 (*)    Chloride 112 (*)    Glucose, Bld 555 (*)    BUN 36 (*)    Albumin 3.2 (*)    Alkaline Phosphatase 152 (*)    Total Bilirubin 0.2 (*)    GFR calc non Af Amer 55 (*)    All other components within normal limits  CBC - Abnormal; Notable for the following:    WBC 12.0 (*)    Platelets 143 (*)    All other components within normal limits  URINALYSIS, ROUTINE W  REFLEX MICROSCOPIC (NOT AT [facility]) - Abnormal; Notable for the following:    APPearance TURBID (*)    Specific Gravity, Urine 1.038 (*)    Glucose, UA >1000 (*)    Hgb urine dipstick SMALL (*)    Leukocytes, UA LARGE (*)    All other components within normal limits  URINE MICROSCOPIC-ADD ON - Abnormal; Notable for the following:    Bacteria, UA FEW (*)    All other components within normal limits  DIFFERENTIAL - Abnormal; Notable for the following:    Neutro Abs 8.8 (*)    All other components within normal limits  CBG MONITORING, ED - Abnormal; Notable for the following:    Glucose-Capillary 485 (*)    All other components within normal limits  I-STAT TROPOININ, ED  CBG MONITORING, ED     EKG Interpretation None       11:43 PM Patient seen and examined. Work-up initiated. Will speak with family when at bedside. Patient currently being catheterized.    Vital signs reviewed and are as follows: BP 164/87 mmHg  Pulse 81  Resp 22  SpO2 100%  1:32 AM Rocephin ordered given UTI. Will admit for hyperglycemia without ketosis, urinary tract infection, acute confusion.  1:46 AM Spoke with Dr. Antionette Char who will see patient. Admit to stepdown given insulin drip.   EKG still outstanding.   MDM   Final diagnoses:  Acute cystitis without hematuria  Hyperglycemia without ketosis  Hypernatremia   Admit.   Renne Crigler, PA-C 05/17/16 6962  Tomasita Crumble, MD 05/17/16 [phone]

## 2016-05-17 ENCOUNTER — Encounter (HOSPITAL_COMMUNITY): Payer: Self-pay | Admitting: Family Medicine

## 2016-05-17 DIAGNOSIS — N390 Urinary tract infection, site not specified: Secondary | ICD-10-CM

## 2016-05-17 DIAGNOSIS — E119 Type 2 diabetes mellitus without complications: Secondary | ICD-10-CM

## 2016-05-17 DIAGNOSIS — Z794 Long term (current) use of insulin: Secondary | ICD-10-CM

## 2016-05-17 DIAGNOSIS — G9340 Encephalopathy, unspecified: Secondary | ICD-10-CM | POA: Diagnosis not present

## 2016-05-17 DIAGNOSIS — I10 Essential (primary) hypertension: Secondary | ICD-10-CM

## 2016-05-17 DIAGNOSIS — E870 Hyperosmolality and hypernatremia: Secondary | ICD-10-CM

## 2016-05-17 DIAGNOSIS — N3000 Acute cystitis without hematuria: Secondary | ICD-10-CM | POA: Diagnosis not present

## 2016-05-17 DIAGNOSIS — E860 Dehydration: Secondary | ICD-10-CM | POA: Diagnosis present

## 2016-05-17 DIAGNOSIS — R739 Hyperglycemia, unspecified: Secondary | ICD-10-CM | POA: Insufficient documentation

## 2016-05-17 DIAGNOSIS — IMO0001 Reserved for inherently not codable concepts without codable children: Secondary | ICD-10-CM | POA: Insufficient documentation

## 2016-05-17 DIAGNOSIS — F0390 Unspecified dementia without behavioral disturbance: Secondary | ICD-10-CM

## 2016-05-17 LAB — COMPREHENSIVE METABOLIC PANEL
ALBUMIN: 3.2 g/dL — AB (ref 3.5–5.0)
ALK PHOS: 152 U/L — AB (ref 38–126)
ALT: 15 U/L (ref 14–54)
AST: 20 U/L (ref 15–41)
Anion gap: 6 (ref 5–15)
BILIRUBIN TOTAL: 0.2 mg/dL — AB (ref 0.3–1.2)
BUN: 36 mg/dL — AB (ref 6–20)
CALCIUM: 9.4 mg/dL (ref 8.9–10.3)
CO2: 29 mmol/L (ref 22–32)
CREATININE: 0.97 mg/dL (ref 0.44–1.00)
Chloride: 112 mmol/L — ABNORMAL HIGH (ref 101–111)
GFR calc Af Amer: >60 mL/min (ref >60)
GFR, EST NON AFRICAN AMERICAN: 55 mL/min — AB (ref >60)
GLUCOSE: 555 mg/dL — AB (ref 65–99)
POTASSIUM: 3.8 mmol/L (ref 3.5–5.1)
Sodium: 147 mmol/L — ABNORMAL HIGH (ref 135–145)
TOTAL PROTEIN: 7.6 g/dL (ref 6.5–8.1)

## 2016-05-17 LAB — URINALYSIS, ROUTINE W REFLEX MICROSCOPIC
BILIRUBIN URINE: NEGATIVE
KETONES UR: NEGATIVE mg/dL
Nitrite: NEGATIVE
PROTEIN: NEGATIVE mg/dL
Specific Gravity, Urine: 1.038 — ABNORMAL HIGH (ref 1.005–1.030)
pH: 7 (ref 5.0–8.0)

## 2016-05-17 LAB — GLUCOSE, CAPILLARY
GLUCOSE-CAPILLARY: 204 mg/dL — AB (ref 65–99)
GLUCOSE-CAPILLARY: 176 mg/dL — AB (ref 65–99)
GLUCOSE-CAPILLARY: 234 mg/dL — AB (ref 65–99)
Glucose-Capillary: 455 mg/dL — ABNORMAL HIGH (ref 65–99)
Glucose-Capillary: 290 mg/dL — ABNORMAL HIGH (ref 65–99)
Glucose-Capillary: 166 mg/dL — ABNORMAL HIGH (ref 65–99)
Glucose-Capillary: 153 mg/dL — ABNORMAL HIGH (ref 65–99)
Glucose-Capillary: 224 mg/dL — ABNORMAL HIGH (ref 65–99)
Glucose-Capillary: 249 mg/dL — ABNORMAL HIGH (ref 65–99)
Glucose-Capillary: 186 mg/dL — ABNORMAL HIGH (ref 65–99)

## 2016-05-17 LAB — BASIC METABOLIC PANEL
ANION GAP: 5 (ref 5–15)
Anion gap: 8 (ref 5–15)
BUN: 28 mg/dL — AB (ref 6–20)
BUN: 24 mg/dL — ABNORMAL HIGH (ref 6–20)
CHLORIDE: 114 mmol/L — AB (ref 101–111)
CO2: 23 mmol/L (ref 22–32)
CO2: 23 mmol/L (ref 22–32)
Calcium: 8.9 mg/dL (ref 8.9–10.3)
Calcium: 8.5 mg/dL — ABNORMAL LOW (ref 8.9–10.3)
Chloride: 119 mmol/L — ABNORMAL HIGH (ref 101–111)
Creatinine, Ser: 0.69 mg/dL (ref 0.44–1.00)
Creatinine, Ser: 0.63 mg/dL (ref 0.44–1.00)
GFR calc Af Amer: >60 mL/min (ref >60)
GFR calc Af Amer: >60 mL/min (ref >60)
GLUCOSE: 166 mg/dL — AB (ref 65–99)
GLUCOSE: 212 mg/dL — AB (ref 65–99)
POTASSIUM: 3.8 mmol/L (ref 3.5–5.1)
POTASSIUM: 3.1 mmol/L — AB (ref 3.5–5.1)
Sodium: 150 mmol/L — ABNORMAL HIGH (ref 135–145)
Sodium: 142 mmol/L (ref 135–145)

## 2016-05-17 LAB — CBC
HCT: 37 % (ref 36.0–46.0)
Hemoglobin: 12.4 g/dL (ref 12.0–15.0)
MCH: 30.4 pg (ref 26.0–34.0)
MCHC: 33.5 g/dL (ref 30.0–36.0)
MCV: 90.7 fL (ref 78.0–100.0)
PLATELETS: 143 10*3/uL — AB (ref 150–400)
RBC: 4.08 MIL/uL (ref 3.87–5.11)
RDW: 14.4 % (ref 11.5–15.5)
WBC: 12 10*3/uL — AB (ref 4.0–10.5)

## 2016-05-17 LAB — MRSA PCR SCREENING: MRSA by PCR: NEGATIVE

## 2016-05-17 LAB — DIFFERENTIAL
BASOS ABS: 0 10*3/uL (ref 0.0–0.1)
Basophils Relative: 0 %
EOS PCT: 0 %
Eosinophils Absolute: 0 10*3/uL (ref 0.0–0.7)
LYMPHS ABS: 2.1 10*3/uL (ref 0.7–4.0)
LYMPHS PCT: 17 %
Monocytes Absolute: 1 10*3/uL (ref 0.1–1.0)
Monocytes Relative: 9 %
NEUTROS PCT: 74 %
Neutro Abs: 8.8 10*3/uL — ABNORMAL HIGH (ref 1.7–7.7)

## 2016-05-17 LAB — I-STAT TROPONIN, ED: Troponin i, poc: 0 ng/mL (ref 0.00–0.08)

## 2016-05-17 LAB — URINE MICROSCOPIC-ADD ON: SQUAMOUS EPITHELIAL / LPF: NONE SEEN

## 2016-05-17 LAB — PROCALCITONIN: PROCALCITONIN: 0.16 ng/mL

## 2016-05-17 MED ORDER — ACETAMINOPHEN 650 MG RE SUPP: 650.0000 mg | Freq: Four times a day (QID) | RECTAL | Status: DC | PRN

## 2016-05-17 MED ORDER — HYDRALAZINE HCL 20 MG/ML IJ SOLN
10.0000 mg | Freq: Once | INTRAMUSCULAR | Status: AC
Start: 2016-05-17 — End: 2016-05-17
  Administered 2016-05-17: 10 mg via INTRAVENOUS

## 2016-05-17 MED ORDER — ONDANSETRON HCL 4 MG/2ML IJ SOLN: 4.0000 mg | Freq: Four times a day (QID) | INTRAMUSCULAR | Status: DC | PRN

## 2016-05-17 MED ORDER — PREDNISONE 1 MG PO TABS
1.0000 mg | ORAL_TABLET | Freq: Every day | ORAL | Status: DC
  Administered 2016-05-17 – 2016-05-19 (×3): 1 mg via ORAL
  Filled 2016-05-17 (×3): qty 1

## 2016-05-17 MED ORDER — VITAMINS A & D EX OINT
TOPICAL_OINTMENT | CUTANEOUS | Status: AC
  Administered 2016-05-17: 1
  Filled 2016-05-17: qty 5

## 2016-05-17 MED ORDER — ADULT MULTIVITAMIN LIQUID CH
15.0000 mL | Freq: Every day | ORAL | Status: DC
  Administered 2016-05-17 – 2016-05-19 (×3): 15 mL via ORAL
  Filled 2016-05-17 (×3): qty 15

## 2016-05-17 MED ORDER — DILTIAZEM HCL ER BEADS 240 MG PO CP24
240.0000 mg | ORAL_CAPSULE | Freq: Every day | ORAL | Status: DC
  Administered 2016-05-17: 240 mg via ORAL
  Filled 2016-05-17: qty 2
  Filled 2016-05-17: qty 1

## 2016-05-17 MED ORDER — DONEPEZIL HCL 10 MG PO TABS
10.0000 mg | ORAL_TABLET | Freq: Every day | ORAL | Status: DC
  Administered 2016-05-17 – 2016-05-19 (×3): 10 mg via ORAL
  Filled 2016-05-17 (×3): qty 1

## 2016-05-17 MED ORDER — SODIUM CHLORIDE 0.9 % IV SOLN
INTRAVENOUS | Status: DC
  Administered 2016-05-17: 4 [IU]/h via INTRAVENOUS
  Filled 2016-05-17: qty 2.5

## 2016-05-17 MED ORDER — DEXTROSE 50 % IV SOLN: 25.0000 mL | INTRAVENOUS | Status: DC | PRN

## 2016-05-17 MED ORDER — ONDANSETRON HCL 4 MG PO TABS: 4.0000 mg | ORAL_TABLET | Freq: Four times a day (QID) | ORAL | Status: DC | PRN

## 2016-05-17 MED ORDER — DILTIAZEM HCL 60 MG PO TABS: 90.0000 mg | ORAL_TABLET | Freq: Three times a day (TID) | ORAL | Status: DC

## 2016-05-17 MED ORDER — TRAMADOL HCL 50 MG PO TABS: 50.0000 mg | ORAL_TABLET | Freq: Four times a day (QID) | ORAL | Status: DC | PRN

## 2016-05-17 MED ORDER — ENOXAPARIN SODIUM 30 MG/0.3ML ~~LOC~~ SOLN
30.0000 mg | SUBCUTANEOUS | Status: DC
  Administered 2016-05-17 – 2016-05-18 (×2): 30 mg via SUBCUTANEOUS
  Filled 2016-05-17 (×2): qty 0.3

## 2016-05-17 MED ORDER — DEXTROSE-NACL 5-0.45 % IV SOLN
INTRAVENOUS | Status: DC
  Administered 2016-05-17: 05:00:00 via INTRAVENOUS

## 2016-05-17 MED ORDER — LISINOPRIL 20 MG PO TABS
40.0000 mg | ORAL_TABLET | Freq: Every day | ORAL | Status: DC
  Administered 2016-05-17 – 2016-05-19 (×3): 40 mg via ORAL
  Filled 2016-05-17 (×2): qty 4
  Filled 2016-05-17: qty 2
  Filled 2016-05-17: qty 4

## 2016-05-17 MED ORDER — INSULIN ASPART 100 UNIT/ML ~~LOC~~ SOLN
0.0000 [IU] | Freq: Three times a day (TID) | SUBCUTANEOUS | Status: DC
  Administered 2016-05-17 (×2): 3 [IU] via SUBCUTANEOUS
  Administered 2016-05-18 – 2016-05-19 (×3): 5 [IU] via SUBCUTANEOUS

## 2016-05-17 MED ORDER — SODIUM CHLORIDE 0.45 % IV SOLN
INTRAVENOUS | Status: DC
  Administered 2016-05-17: 08:00:00 via INTRAVENOUS

## 2016-05-17 MED ORDER — DEXTROSE 5 % IV SOLN
INTRAVENOUS | Status: DC
  Administered 2016-05-17: 11:00:00 via INTRAVENOUS

## 2016-05-17 MED ORDER — DILTIAZEM HCL 90 MG PO TABS
90.0000 mg | ORAL_TABLET | Freq: Three times a day (TID) | ORAL | Status: DC
  Administered 2016-05-18 – 2016-05-19 (×4): 90 mg via ORAL
  Filled 2016-05-17 (×5): qty 1

## 2016-05-17 MED ORDER — HYDRALAZINE HCL 20 MG/ML IJ SOLN
10.0000 mg | INTRAMUSCULAR | Status: DC | PRN
  Administered 2016-05-17: 10 mg via INTRAVENOUS
  Filled 2016-05-17 (×3): qty 1

## 2016-05-17 MED ORDER — INSULIN REGULAR BOLUS VIA INFUSION
0.0000 [IU] | Freq: Three times a day (TID) | INTRAVENOUS | Status: DC
  Filled 2016-05-17: qty 10

## 2016-05-17 MED ORDER — POLYETHYLENE GLYCOL 3350 17 G PO PACK: 17.0000 g | PACK | Freq: Every day | ORAL | Status: DC | PRN

## 2016-05-17 MED ORDER — SERTRALINE HCL 50 MG PO TABS
50.0000 mg | ORAL_TABLET | Freq: Every day | ORAL | Status: DC
  Administered 2016-05-17 – 2016-05-18 (×2): 50 mg via ORAL
  Filled 2016-05-17 (×2): qty 1

## 2016-05-17 MED ORDER — CEFTRIAXONE SODIUM 1 G IJ SOLR
1.0000 g | INTRAMUSCULAR | Status: DC
  Administered 2016-05-17 – 2016-05-18 (×2): 1 g via INTRAVENOUS
  Filled 2016-05-17 (×2): qty 10

## 2016-05-17 MED ORDER — INSULIN DETEMIR 100 UNIT/ML ~~LOC~~ SOLN
12.0000 [IU] | Freq: Two times a day (BID) | SUBCUTANEOUS | Status: DC
Start: 2016-05-17 — End: 2016-05-18
  Administered 2016-05-17 (×2): 12 [IU] via SUBCUTANEOUS
  Filled 2016-05-17 (×3): qty 0.12

## 2016-05-17 MED ORDER — SODIUM CHLORIDE 0.45 % IV SOLN
INTRAVENOUS | Status: DC
  Administered 2016-05-17: 125 mL/h via INTRAVENOUS

## 2016-05-17 MED ORDER — ACETAMINOPHEN 325 MG PO TABS: 650.0000 mg | ORAL_TABLET | Freq: Four times a day (QID) | ORAL | Status: DC | PRN

## 2016-05-17 MED ORDER — NEBIVOLOL HCL 5 MG PO TABS
5.0000 mg | ORAL_TABLET | Freq: Every day | ORAL | Status: DC
  Administered 2016-05-17 – 2016-05-19 (×3): 5 mg via ORAL
  Filled 2016-05-17 (×3): qty 1

## 2016-05-17 MED ORDER — CEFTRIAXONE SODIUM 1 G IJ SOLR
1.0000 g | Freq: Once | INTRAMUSCULAR | Status: AC
  Administered 2016-05-17: 1 g via INTRAVENOUS
  Filled 2016-05-17: qty 10

## 2016-05-17 NOTE — H&P (Signed)
History and Physical    Diana Hartman:[id number] DOB: October 21, 1941 DOA: 05/16/2016  PCP: Dwan Bolt, MD   Patient coming from: Home   Chief Complaint: AMS, hyperglycemia   HPI: Diana Hartman is a 75 y.o. female with medical history significant for insulin-dependent diabetes mellitus, hypertension, and dementia who presents to the ED with increased confusion and lethargy as well as elevated blood sugars. Patient is accompanied by her daughters who provide much of the history. She was reportedly in her usual state, intermittently conversant with family, until the past couple days when she has become more lethargic and less interactive. This worsened significantly on the day of her admission, and was accompanied by blood glucose readings in the mid 400s. Patient's daughter reports that the most recent CBG at home simply read "high." Patient has not voiced any complaints, has not demonstrated a cough, vomited, or had diarrhea. There has been no recent fall. There has been no known fevers at home.  ED Course: Upon arrival to the ED, patient is found to be afebrile, saturating adequately on room air, and with vital signs stable. Chemistry panel was obtained and notable for sodium 147, BUN 36, and glucose of 555. CBC is notable for a leukocytosis to 12,000 and a mild thrombocytopenia with platelets at 143,000. Troponin is undetectable. Urinalysis features bacteria with large leukocyte, negative nitrite, too numerous to count WBC, elevated specific gravity, and negative ketones. Patient was given a 500 mL normal saline bolus in the emergency department and treated with empiric Rocephin for suspected UTI. Given the hypernatremia, she was placed on half-normal saline infusion and started on insulin drip. She remained hemodynamically stable in the ED and will be admitted to the stepdown unit for ongoing evaluation and management of UTI in an elderly female with uncontrolled blood sugars.  Review  of Systems:  All other systems reviewed and apart from HPI, are negative.  Past Medical History  Diagnosis Date  . Diabetes mellitus   . Hypertension   . Temporal arteritis ([place])   . Dementia     History reviewed. No pertinent past surgical history.   reports that she has never smoked. She does not have any smokeless tobacco history on file. She reports that she does not drink alcohol or use illicit drugs.  No Known Allergies  Family History  Problem Relation Age of Onset  . Diabetes type II Other   . Hypertension Other      Prior to Admission medications   Medication Sig Start Date End Date Taking? Authorizing Provider  diltiazem (TIAZAC) 240 MG 24 hr capsule Take 240 mg by mouth daily. 05/30/15  Yes Historical Provider, MD  donepezil (ARICEPT) 10 MG tablet Take 10 mg by mouth daily.   Yes Historical Provider, MD  glipiZIDE (GLUCOTROL) 5 MG tablet Take 5 mg by mouth daily. Reported on 12/29/2015 12/11/15  Yes Historical Provider, MD  insulin detemir (LEVEMIR) 100 UNIT/ML injection Inject 0.1 mLs (10 Units total) into the skin 2 (two) times daily. 07/06/15  Yes Costin Karlyne Greenspan, MD  Iron-Vitamins (GERITOL) LIQD Take 5 mLs by mouth daily.   Yes Historical Provider, MD  lisinopril (PRINIVIL,ZESTRIL) 40 MG tablet Take 40 mg by mouth daily.     Yes Historical Provider, MD  metFORMIN (GLUCOPHAGE) 1000 MG tablet Take 1,000 mg by mouth daily with breakfast.   Yes Historical Provider, MD  nebivolol (BYSTOLIC) 5 MG tablet Take 5 mg by mouth daily.   Yes Historical Provider, MD  predniSONE (DELTASONE)  1 MG tablet Take 1 mg by mouth daily.  06/30/15  Yes Historical Provider, MD  sertraline (ZOLOFT) 50 MG tablet Take 50 mg by mouth at bedtime.    Yes Historical Provider, MD  traMADol (ULTRAM) 50 MG tablet Take 1 tablet (50 mg total) by mouth every 6 (six) hours as needed. 12/29/15  Yes Noemi Chapel, MD  zolpidem (AMBIEN) 10 MG tablet Take 10 mg by mouth at bedtime.  05/31/15  Yes Historical  Provider, MD    Physical Exam: Filed Vitals:   05/16/16 2337 05/17/16 0055 05/17/16 0300  BP: 164/87 155/82   Pulse: 81 81   Temp: 99 F (37.2 C)    TempSrc: Oral    Resp: 22 19   Height: '5\' 3"'  (1.6 m)  '5\' 3"'  (1.6 m)  Weight:   45.8 kg (100 lb 15.5 oz)  SpO2: 100% 98%       Constitutional: NAD, calm, cachectic  Eyes: PERTLA, lids and conjunctivae normal ENMT: Mucous membranes are dry. Posterior pharynx clear of any exudate or lesions.   Neck: normal, supple, no masses, no thyromegaly Respiratory: clear to auscultation bilaterally, no wheezing, no crackles. No accessory muscle use.  Cardiovascular: S1 & S2 heard, regular rate and rhythm. No extremity edema. No significant JVD. Abdomen: No distension, no tenderness, no masses palpated. Bowel sounds normal.  Musculoskeletal: no clubbing / cyanosis. No joint deformity upper and lower extremities.    Skin: no significant rashes, lesions, ulcers. Warm, dry, well-perfused. Neurologic: Lethargic, not verbally responsive, opens eyes to tactile stimuli. PERRL. Patellar DTR's normal. Babinski down-going bilaterally.  Psychiatric: Difficult to assess given the clinical scenario.     Labs on Admission: I have personally reviewed following labs and imaging studies  CBC:  Recent Labs Lab 05/17/16 0027  WBC 12.0*  NEUTROABS 8.8*  HGB 12.4  HCT 37.0  MCV 90.7  PLT 268*   Basic Metabolic Panel:  Recent Labs Lab 05/17/16 0027  NA 147*  K 3.8  CL 112*  CO2 29  GLUCOSE 555*  BUN 36*  CREATININE 0.97  CALCIUM 9.4   GFR: Estimated Creatinine Clearance: 35.7 mL/min (by C-G formula based on Cr of 0.97). Liver Function Tests:  Recent Labs Lab 05/17/16 0027  AST 20  ALT 15  ALKPHOS 152*  BILITOT 0.2*  PROT 7.6  ALBUMIN 3.2*   No results for input(s): LIPASE, AMYLASE in the last 168 hours. No results for input(s): AMMONIA in the last 168 hours. Coagulation Profile: No results for input(s): INR, PROTIME in the last  168 hours. Cardiac Enzymes: No results for input(s): CKTOTAL, CKMB, CKMBINDEX, TROPONINI in the last 168 hours. BNP (last 3 results) No results for input(s): PROBNP in the last 8760 hours. HbA1C: No results for input(s): HGBA1C in the last 72 hours. CBG:  Recent Labs Lab 05/16/16 2351 05/17/16 0214 05/17/16 0335  GLUCAP 485* 455* 290*   Lipid Profile: No results for input(s): CHOL, HDL, LDLCALC, TRIG, CHOLHDL, LDLDIRECT in the last 72 hours. Thyroid Function Tests: No results for input(s): TSH, T4TOTAL, FREET4, T3FREE, THYROIDAB in the last 72 hours. Anemia Panel: No results for input(s): VITAMINB12, FOLATE, FERRITIN, TIBC, IRON, RETICCTPCT in the last 72 hours. Urine analysis:    Component Value Date/Time   COLORURINE YELLOW 05/16/2016 2344   APPEARANCEUR TURBID* 05/16/2016 2344   LABSPEC 1.038* 05/16/2016 2344   PHURINE 7.0 05/16/2016 2344   GLUCOSEU >1000* 05/16/2016 2344   HGBUR SMALL* 05/16/2016 [place] NEGATIVE 05/16/2016 2344   KETONESUR  NEGATIVE 05/16/2016 2344   PROTEINUR NEGATIVE 05/16/2016 2344   UROBILINOGEN 1.0 07/04/2015 1220   NITRITE NEGATIVE 05/16/2016 2344   LEUKOCYTESUR LARGE* 05/16/2016 2344   Sepsis Labs: '@LABRCNTIP' (procalcitonin:4,lacticidven:4) )No results found for this or any previous visit (from the past 240 hour(s)).   Radiological Exams on Admission: No results found.  EKG: Ordered and pending.   Assessment/Plan  1. UTI  - UA is suggestive of infection, no prior urine cultures on file  - Treating empirically with Rocephin while awaiting culture data    2. Hyperglycemia, type II DM  - Serum glucose 555 on admission - No acidosis or ketosis present  - Likely secondary to the acute infection  - Started on insulin infusion in ED, will continue until CBG <250 x2  - A1c was 6.8% in July 2016, reflecting good control at that time  - Transition to sq once parameters met    3. Acute encephalopathy on background of dementia  -  Suspected secondary to the acute UTI with hypernatremia possibly contributing  - Anticipate resolution with treatment of the infection   - If fails to respond as anticipated, may need to extend the work-up   4. Hypernatremia  - Serum sodium 147 on admission, corrects to 154 when hyperglycemia taken into account  - Pt is dehydrated on admission and received 500 cc NS  - Started on 1/2 NS at 125 cc/hr, reduced to 75 cc/hr    - Repeat chem panel in am and intervene further as indicated     DVT prophylaxis: sq Lovenox  Code Status: Full  Family Communication: Daughters updated at bedside  Disposition Plan: Observe in stepdown  Consults called: None  Admission status: Observation    Vianne Bulls, MD [facility] Pager [phone]  If 7PM-7AM, please contact night-coverage [email or web address] Password [facility]  05/17/2016, 3:42 AM

## 2016-05-17 NOTE — Progress Notes (Signed)
PHARMACY NOTE -  Rocephin  Pharmacy was asked to dose Rocephin for UTI. Need for further dosage adjustment appears unlikely at present.    Will sign off at this time.  Please reconsult if a change in clinical status warrants re-evaluation of dosage.  Charolotte Ekeom Don Giarrusso, PharmD, pager [phone]. 05/17/2016,8:00 AM.

## 2016-05-17 NOTE — Progress Notes (Signed)
PROGRESS NOTE    Diana Hartman  ZOX:[id number] DOB: 1941/07/14 DOA: 05/16/2016 PCP: Michiel SitesKOHUT,WALTER DENNIS, MD    Brief Narrative: Diana Hartman is a 75 y.o. female with medical history significant for insulin-dependent diabetes mellitus, hypertension, and dementia who presents to the ED with increased confusion and lethargy as well as elevated blood sugars.   Patient admitted with acute encephalopathy, UTI, Hypernatremia.    Assessment & Plan:   Principal Problem:   UTI (lower urinary tract infection) Active Problems:   Diabetes mellitus, type II (HCC)   Essential hypertension   Dementia   Hyperglycemia   Hypernatremia   Dehydration   Acute encephalopathy  1-Acute encephalopathy: presents with lethargic, suspect related to electrolytes abnormalities, hyperglycemia and infection.  Improving.  Treating underline cause.   2-UTI; UA with too numerous to count WBC.  Continue with ceftriaxone day 1.  Follow urine culture.   3-Uncontrolled DM, Hyperglycemia:  CBG decreasing.  Will transition from insulin Gtt to levemir 12 units BID.  SSI.   4-Hypernatremia: continue with IV fluids. Repeat labs for this am pending.   5-HNT; uncontrolled.  continue with Diltiazem, Lisinopril, Bystolic.  Will use PRN hydralazine for uncontrolled BP.   6-Dementia:  Continue with donepezil, Zoloft   7-Temporal arteritis -Continue prednisone   DVT prophylaxis: Lovenox Code Status: Full code.  Family Communication: care discussed with daughter  Disposition Plan: transfer to med-surgery    Consultants:   none  Procedures:   none  Antimicrobials:   Ceftriaxone 6-11   Subjective: Patient is more alert this morning. Denies pain.  Per daughter patient appears to have improved.   Objective: Filed Vitals:   05/17/16 0400 05/17/16 0500 05/17/16 0600 05/17/16 0700  BP: 160/76 168/89 171/81 169/83  Pulse: 75 76 75 72  Temp:      TempSrc:      Resp: 20 15 17 26   Height:       Weight:      SpO2: 98% 98% 100% 99%    Intake/Output Summary (Last 24 hours) at 05/17/16 0715 Last data filed at 05/17/16 0700  Gross per 24 hour  Intake 586.05 ml  Output      0 ml  Net 586.05 ml   Filed Weights   05/17/16 0300  Weight: 45.8 kg (100 lb 15.5 oz)    Examination:  General exam: Appears calm and comfortable  Respiratory system: Clear to auscultation. Respiratory effort normal. Cardiovascular system: S1 & S2 heard, RRR. No JVD, murmurs, rubs, gallops or clicks. No pedal edema. Gastrointestinal system: Abdomen is nondistended, soft and nontender. No organomegaly or masses felt. Normal bowel sounds heard. Central nervous system: Alert, . No focal neurological deficits. Extremities: Symmetric 5 x 5 power. Skin: No rashes, lesions or ulcers Psychiatry: Alert, answer yes and no questions.     Data Reviewed: I have personally reviewed following labs and imaging studies  CBC:  Recent Labs Lab 05/17/16 0027  WBC 12.0*  NEUTROABS 8.8*  HGB 12.4  HCT 37.0  MCV 90.7  PLT 143*   Basic Metabolic Panel:  Recent Labs Lab 05/17/16 0027  NA 147*  K 3.8  CL 112*  CO2 29  GLUCOSE 555*  BUN 36*  CREATININE 0.97  CALCIUM 9.4   GFR: Estimated Creatinine Clearance: 35.7 mL/min (by C-G formula based on Cr of 0.97). Liver Function Tests:  Recent Labs Lab 05/17/16 0027  AST 20  ALT 15  ALKPHOS 152*  BILITOT 0.2*  PROT 7.6  ALBUMIN 3.2*  No results for input(s): LIPASE, AMYLASE in the last 168 hours. No results for input(s): AMMONIA in the last 168 hours. Coagulation Profile: No results for input(s): INR, PROTIME in the last 168 hours. Cardiac Enzymes: No results for input(s): CKTOTAL, CKMB, CKMBINDEX, TROPONINI in the last 168 hours. BNP (last 3 results) No results for input(s): PROBNP in the last 8760 hours. HbA1C: No results for input(s): HGBA1C in the last 72 hours. CBG:  Recent Labs Lab 05/16/16 2351 05/17/16 0214 05/17/16 0335  05/17/16 0437 05/17/16 0643  GLUCAP 485* 455* 290* 204* 176*   Lipid Profile: No results for input(s): CHOL, HDL, LDLCALC, TRIG, CHOLHDL, LDLDIRECT in the last 72 hours. Thyroid Function Tests: No results for input(s): TSH, T4TOTAL, FREET4, T3FREE, THYROIDAB in the last 72 hours. Anemia Panel: No results for input(s): VITAMINB12, FOLATE, FERRITIN, TIBC, IRON, RETICCTPCT in the last 72 hours. Sepsis Labs:  Recent Labs Lab 05/17/16 0513  PROCALCITON 0.16    Recent Results (from the past 240 hour(s))  MRSA PCR Screening     Status: None   Collection Time: 05/17/16  3:36 AM  Result Value Ref Range Status   MRSA by PCR NEGATIVE NEGATIVE Final    Comment:        The GeneXpert MRSA Assay (FDA approved for NASAL specimens only), is one component of a comprehensive MRSA colonization surveillance program. It is not intended to diagnose MRSA infection nor to guide or monitor treatment for MRSA infections.          Radiology Studies: No results found.      Scheduled Meds: . cefTRIAXone (ROCEPHIN)  IV  1 g Intravenous Q24H  . diltiazem  240 mg Oral Daily  . donepezil  10 mg Oral Daily  . enoxaparin (LOVENOX) injection  30 mg Subcutaneous Q24H  . insulin regular  0-10 Units Intravenous TID WC  . lisinopril  40 mg Oral Daily  . multivitamin  15 mL Oral Daily  . nebivolol  5 mg Oral Daily  . predniSONE  1 mg Oral Daily  . sertraline  50 mg Oral QHS   Continuous Infusions: . sodium chloride Stopped (05/17/16 0447)  . dextrose 5 % and 0.45% NaCl 100 mL/hr at 05/17/16 0447  . insulin (NOVOLIN-R) infusion 1.1 Units/hr (05/17/16 0555)     LOS: 0 days    Time spent: 35 minutes.     Alba Cory, MD [facility] Pager [phone]  If 7PM-7AM, please contact night-coverage [email or web address] Password [facility] 05/17/2016, 7:15 AM

## 2016-05-17 NOTE — ED Notes (Signed)
Attempted IV x2 without success  

## 2016-05-17 NOTE — Progress Notes (Signed)
Pharmacy Antibiotic Note  Diana Hartman is a 75 y.o. female admitted on 05/16/2016 with UTI.  Pharmacy has been consulted for Ceftriaxone dosing.  Plan: Ceftriaxone 1gm iv q24hr    Height: 5\' 3"  (160 cm) Weight: 100 lb 15.5 oz (45.8 kg) IBW/kg (Calculated) : 52.4  Temp (24hrs), Avg:99 F (37.2 C), Min:99 F (37.2 C), Max:99 F (37.2 C)   Recent Labs Lab 05/17/16 0027  WBC 12.0*  CREATININE 0.97    Estimated Creatinine Clearance: 35.7 mL/min (by C-G formula based on Cr of 0.97).    No Known Allergies  Antimicrobials this admission: Ceftriaxone 6/10 >>  Dose adjustments this admission: -  Microbiology results: pending  Thank you for allowing pharmacy to be a part of this patient's care.  Aleene DavidsonGrimsley Jr, Justine Dines Crowford 05/17/2016 6:30 AM

## 2016-05-18 DIAGNOSIS — E860 Dehydration: Secondary | ICD-10-CM | POA: Diagnosis not present

## 2016-05-18 DIAGNOSIS — N3000 Acute cystitis without hematuria: Secondary | ICD-10-CM | POA: Diagnosis not present

## 2016-05-18 DIAGNOSIS — I10 Essential (primary) hypertension: Secondary | ICD-10-CM | POA: Diagnosis not present

## 2016-05-18 DIAGNOSIS — D696 Thrombocytopenia, unspecified: Secondary | ICD-10-CM | POA: Diagnosis not present

## 2016-05-18 DIAGNOSIS — Z23 Encounter for immunization: Secondary | ICD-10-CM | POA: Diagnosis not present

## 2016-05-18 DIAGNOSIS — Z794 Long term (current) use of insulin: Secondary | ICD-10-CM | POA: Diagnosis not present

## 2016-05-18 DIAGNOSIS — Z833 Family history of diabetes mellitus: Secondary | ICD-10-CM | POA: Diagnosis not present

## 2016-05-18 DIAGNOSIS — E1165 Type 2 diabetes mellitus with hyperglycemia: Secondary | ICD-10-CM | POA: Diagnosis not present

## 2016-05-18 DIAGNOSIS — M316 Other giant cell arteritis: Secondary | ICD-10-CM | POA: Diagnosis not present

## 2016-05-18 DIAGNOSIS — Z8249 Family history of ischemic heart disease and other diseases of the circulatory system: Secondary | ICD-10-CM | POA: Diagnosis not present

## 2016-05-18 DIAGNOSIS — Z7952 Long term (current) use of systemic steroids: Secondary | ICD-10-CM | POA: Diagnosis not present

## 2016-05-18 DIAGNOSIS — N390 Urinary tract infection, site not specified: Secondary | ICD-10-CM | POA: Diagnosis not present

## 2016-05-18 DIAGNOSIS — Z79899 Other long term (current) drug therapy: Secondary | ICD-10-CM | POA: Diagnosis not present

## 2016-05-18 DIAGNOSIS — E43 Unspecified severe protein-calorie malnutrition: Secondary | ICD-10-CM | POA: Insufficient documentation

## 2016-05-18 DIAGNOSIS — Z681 Body mass index (BMI) 19 or less, adult: Secondary | ICD-10-CM | POA: Diagnosis not present

## 2016-05-18 DIAGNOSIS — R4182 Altered mental status, unspecified: Secondary | ICD-10-CM | POA: Diagnosis present

## 2016-05-18 DIAGNOSIS — E870 Hyperosmolality and hypernatremia: Secondary | ICD-10-CM | POA: Diagnosis not present

## 2016-05-18 DIAGNOSIS — Z87440 Personal history of urinary (tract) infections: Secondary | ICD-10-CM | POA: Diagnosis not present

## 2016-05-18 DIAGNOSIS — F0390 Unspecified dementia without behavioral disturbance: Secondary | ICD-10-CM | POA: Diagnosis not present

## 2016-05-18 DIAGNOSIS — E11649 Type 2 diabetes mellitus with hypoglycemia without coma: Secondary | ICD-10-CM | POA: Diagnosis not present

## 2016-05-18 DIAGNOSIS — G9340 Encephalopathy, unspecified: Secondary | ICD-10-CM | POA: Diagnosis not present

## 2016-05-18 LAB — CBC
HEMATOCRIT: 33.1 % — AB (ref 36.0–46.0)
HEMOGLOBIN: 11 g/dL — AB (ref 12.0–15.0)
MCH: 29.9 pg (ref 26.0–34.0)
MCHC: 33.2 g/dL (ref 30.0–36.0)
MCV: 89.9 fL (ref 78.0–100.0)
Platelets: 118 10*3/uL — ABNORMAL LOW (ref 150–400)
RBC: 3.68 MIL/uL — ABNORMAL LOW (ref 3.87–5.11)
RDW: 14.6 % (ref 11.5–15.5)
WBC: 11.2 10*3/uL — ABNORMAL HIGH (ref 4.0–10.5)

## 2016-05-18 LAB — BASIC METABOLIC PANEL
Anion gap: 5 (ref 5–15)
BUN: 21 mg/dL — ABNORMAL HIGH (ref 6–20)
CALCIUM: 8.3 mg/dL — AB (ref 8.9–10.3)
CHLORIDE: 111 mmol/L (ref 101–111)
CO2: 26 mmol/L (ref 22–32)
CREATININE: 0.5 mg/dL (ref 0.44–1.00)
GFR calc non Af Amer: >60 mL/min (ref >60)
GLUCOSE: 93 mg/dL (ref 65–99)
Potassium: 3.2 mmol/L — ABNORMAL LOW (ref 3.5–5.1)
Sodium: 142 mmol/L (ref 135–145)

## 2016-05-18 LAB — GLUCOSE, CAPILLARY
GLUCOSE-CAPILLARY: 265 mg/dL — AB (ref 65–99)
Glucose-Capillary: 68 mg/dL (ref 65–99)
Glucose-Capillary: 73 mg/dL (ref 65–99)
Glucose-Capillary: 300 mg/dL — ABNORMAL HIGH (ref 65–99)
Glucose-Capillary: 169 mg/dL — ABNORMAL HIGH (ref 65–99)

## 2016-05-18 MED ORDER — DEXTROSE-NACL 5-0.45 % IV SOLN: INTRAVENOUS | Status: DC

## 2016-05-18 MED ORDER — DEXTROSE-NACL 5-0.9 % IV SOLN
INTRAVENOUS | Status: DC
  Administered 2016-05-18: 01:00:00 via INTRAVENOUS

## 2016-05-18 MED ORDER — INSULIN DETEMIR 100 UNIT/ML ~~LOC~~ SOLN
10.0000 [IU] | Freq: Two times a day (BID) | SUBCUTANEOUS | Status: DC
Start: 2016-05-18 — End: 2016-05-18
  Filled 2016-05-18: qty 0.1

## 2016-05-18 MED ORDER — INSULIN DETEMIR 100 UNIT/ML ~~LOC~~ SOLN
8.0000 [IU] | Freq: Two times a day (BID) | SUBCUTANEOUS | Status: DC
  Administered 2016-05-18: 8 [IU] via SUBCUTANEOUS
  Filled 2016-05-18: qty 0.08

## 2016-05-18 MED ORDER — PNEUMOCOCCAL VAC POLYVALENT 25 MCG/0.5ML IJ INJ
0.5000 mL | INJECTION | INTRAMUSCULAR | Status: AC
  Administered 2016-05-19: 0.5 mL via INTRAMUSCULAR
  Filled 2016-05-18 (×2): qty 0.5

## 2016-05-18 MED ORDER — POTASSIUM CHLORIDE CRYS ER 20 MEQ PO TBCR
40.0000 meq | EXTENDED_RELEASE_TABLET | Freq: Once | ORAL | Status: AC
  Administered 2016-05-18: 40 meq via ORAL
  Filled 2016-05-18: qty 2

## 2016-05-18 MED ORDER — DEXTROSE-NACL 5-0.9 % IV SOLN: INTRAVENOUS | Status: DC

## 2016-05-18 MED ORDER — INSULIN DETEMIR 100 UNIT/ML ~~LOC~~ SOLN
10.0000 [IU] | Freq: Two times a day (BID) | SUBCUTANEOUS | Status: DC
  Administered 2016-05-18 – 2016-05-19 (×2): 10 [IU] via SUBCUTANEOUS
  Filled 2016-05-18 (×3): qty 0.1

## 2016-05-18 MED ORDER — ENSURE ENLIVE PO LIQD
237.0000 mL | Freq: Two times a day (BID) | ORAL | Status: DC
  Administered 2016-05-18 – 2016-05-19 (×2): 237 mL via ORAL

## 2016-05-18 NOTE — Progress Notes (Signed)
Inpatient Diabetes Program Recommendations  AACE/Natassja: New Consensus Statement on Inpatient Glycemic Control (2015)  Target Ranges:  Prepandial:   less than 140 mg/dL      Peak postprandial:   less than 180 mg/dL (1-2 hours)      Critically ill patients:  140 - 180 mg/dL   Results for Diana Hartman, Diana Hartman (MRN [id number]) as of 05/18/2016 12:58  Ref. Range 05/16/2016 23:51 05/17/2016 02:14 05/17/2016 03:35 05/17/2016 04:37 05/17/2016 05:41 05/17/2016 06:43 05/17/2016 07:30 05/17/2016 11:50 05/17/2016 14:51 05/17/2016 17:46 05/17/2016 19:35  Glucose-Capillary Latest Ref Range: 65-99 mg/dL 782485 (H) 956455 (H) 213290 (H) 204 (H) 166 (H) 176 (H) 153 (H) 234 (H) 224 (H) 249 (H) 186 (H)   Results for Diana Hartman, Diana Hartman (MRN [id number]) as of 05/18/2016 12:58  Ref. Range 05/18/2016 07:25 05/18/2016 07:50 05/18/2016 11:54  Glucose-Capillary Latest Ref Range: 65-99 mg/dL 68 73 629300 (H)     Home DM Meds: Levemir 10 units bid       Glipizide 5 mg daily       Metformin 1000 mg daily  Current Insulin Orders: Levemir 8 units bid      Novolog Sensitive Correction Scale/ SSI (0-9 units) TID AC        -Note Levemir reduced to 8 units bid this AM secondary to mild Hypoglycemia this AM.  -CBG at 12pm today up to 300 mg/dl.  -Patient currently receiving solid PO diet.    MD- Please consider the following in-hospital insulin adjustments:  Start Novolog Meal Coverage while home PO DM medications on hold--Novolog 3 units tid with meals (hold if patient eats <50% of meal)      --Will follow patient during hospitalization--  Ambrose FinlandJeannine Johnston Mayra Jolliffe RN, MSN, CDE Diabetes Coordinator Inpatient Glycemic Control Team Team Pager: [phone] (8a-5p)

## 2016-05-18 NOTE — Care Management Obs Status (Signed)
MEDICARE OBSERVATION STATUS NOTIFICATION   Patient Details  Name: Diana Hartman Un MRN: [id number] Date of Birth: 02/20/1941   Medicare Observation Status Notification Given:  Yes    Golda AcreDavis, Rhonda Lynn, RN 05/18/2016, 1:42 PM

## 2016-05-18 NOTE — Progress Notes (Addendum)
Initial Nutrition Assessment  DOCUMENTATION CODES:   Severe malnutrition in context of chronic illness, Underweight  INTERVENTION:  - Will order Ensure Enlive po BID, each supplement provides 350 kcal and 20 grams of protein. - Encourage PO intakes of meals and supplements. - Tech/RN to assist with meals as needed.  - RD will continue to monitor for needs.  NUTRITION DIAGNOSIS:   Malnutrition related to chronic illness as evidenced by severe depletion of muscle mass, severe depletion of body fat.  GOAL:   Patient will meet greater than or equal to 90% of their needs  MONITOR:   PO intake, Supplement acceptance, Weight trends, Labs, Skin, I & O's  REASON FOR ASSESSMENT:   Malnutrition Screening Tool  ASSESSMENT:   75 y.o. female with medical history significant for insulin-dependent diabetes mellitus, hypertension, and dementia who presents to the ED with increased confusion and lethargy as well as elevated blood sugars. Patient is accompanied by her daughters who provide much of the history. She was reportedly in her usual state, intermittently conversant with family, until the past couple days when she has become more lethargic and less interactive. This worsened significantly on the day of her admission, and was accompanied by blood glucose readings in the mid 400s. Patient's daughter reports that the most recent CBG at home simply read "high." Patient has not voiced any complaints, has not demonstrated a cough, vomited, or had diarrhea. There has been no recent fall. There has been no known fevers at home.  Pt seen for MST. BMI indicates underweight status. No intakes documented since admission. Pt noted to have hx of dementia with current AMS/acute encephalopathy. Pt was unable to provide information from PTA or since admission but is able to state that she has a good appetite this AM and does not have any abdominal pain or nausea this AM. No family/visitors present at this time.    Unsure if pt was consuming supplements PTA or meeting needs PTA. Will order Ensure Enlive BID to supplement and adjust as needed. Medications reviewed. IVF: D5-NS @ 50 mL/hr (204 kcal). Labs reviewed; CBGs: 68-249 mg/dL since 9/146/11 AM, K: 3.2 mmol/L, BUN: 21 mg/dL, Ca: 8.3 mg/dL.  ADDENDUM: Noted several missing bottom teeth. No weight hx available in the chart since 2012.   Diet Order:  Diet Carb Modified Fluid consistency:: Thin; Room service appropriate?: Yes  Skin:  Reviewed, no issues  Last BM:  6/12  Height:   Ht Readings from Last 1 Encounters:  05/17/16 5\' 3"  (1.6 m)    Weight:   Wt Readings from Last 1 Encounters:  05/17/16 100 lb 15.5 oz (45.8 kg)    Ideal Body Weight:  52.27 kg (kg)  BMI:  Body mass index is 17.89 kg/(m^2).  Estimated Nutritional Needs:   Kcal:  1150-1375  Protein:  45-55 grams  Fluid:  1.5 L/day  EDUCATION NEEDS:   No education needs identified at this time     Trenton GammonJessica Klyde Banka, MS, RD, LDN Inpatient Clinical Dietitian Pager # [phone] After hours/weekend pager # [phone]

## 2016-05-18 NOTE — Progress Notes (Signed)
PROGRESS NOTE    Diana Haringda B Koppel  ZOX:[id number] DOB: 09-27-1941 DOA: 05/16/2016 PCP: Michiel SitesKOHUT,WALTER DENNIS, MD    Brief Narrative: Diana Hartman is a 75 y.o. female with medical history significant for insulin-dependent diabetes mellitus, hypertension, and dementia who presents to the ED with increased confusion and lethargy as well as elevated blood sugars.   Patient admitted with acute encephalopathy, UTI, Hypernatremia.    Assessment & Plan:   Principal Problem:   UTI (lower urinary tract infection) Active Problems:   Diabetes mellitus, type II (HCC)   Essential hypertension   Dementia   Hyperglycemia   Hypernatremia   Dehydration   Acute encephalopathy   Acute cystitis without hematuria   Hyperglycemia without ketosis   Insulin dependent diabetes mellitus (HCC)  1-Acute encephalopathy: presents with lethargic, suspect related to electrolytes abnormalities, hyperglycemia and infection.  Improving.  Treating underline cause.   2-UTI; UA with too numerous to count WBC.  Continue with ceftriaxone day 2.  Follow urine culture.   3-Uncontrolled DM, Hyperglycemia:  CBG decreasing.  She was transition from insulin Gtt to levemir. Had hypoglycemia this am. Will decrease levemir to 8 units.   4-Hypernatremia: resolved with D 5 IV fluids. Continue with IV fluid.   5-HNT; better controlled this am.  continue with Diltiazem, Lisinopril, Bystolic.  Will use PRN hydralazine for uncontrolled BP.   6-Dementia:  Continue with donepezil, Zoloft   7-Temporal arteritis -Continue prednisone  8-Thrombocytopenia; hold Lovenox, start SCD. Suspect related to acute illness.  \ DVT prophylaxis: Lovenox Code Status: Full code.  Family Communication: care discussed with daughter 6-11 Disposition Plan: transfer to med-surgery    Consultants:   none  Procedures:   none  Antimicrobials:   Ceftriaxone 6-11   Subjective: Patient is alert, conversant , confuse, not  oriented to place, situation.  She is feeling better, denies abdominal pain, chest pain or dyspnea. She is willing to eat breakfast, appetite improved.   Objective: Filed Vitals:   05/18/16 0200 05/18/16 0300 05/18/16 0400 05/18/16 0500  BP: 137/71 136/78 145/58 126/62  Pulse: 68 72 65   Temp:   98.9 F (37.2 C)   TempSrc:   Oral   Resp: 19 15 16 15   Height:      Weight:      SpO2: 100% 100% 100%     Intake/Output Summary (Last 24 hours) at 05/18/16 0758 Last data filed at 05/18/16 0400  Gross per 24 hour  Intake 911.36 ml  Output      4 ml  Net 907.36 ml   Filed Weights   05/17/16 0300  Weight: 45.8 kg (100 lb 15.5 oz)    Examination:  General exam: Appears calm and comfortable  Respiratory system: Clear to auscultation. Respiratory effort normal. Cardiovascular system: S1 & S2 heard, RRR. No JVD, murmurs, rubs, gallops or clicks. No pedal edema. Gastrointestinal system: Abdomen is nondistended, soft and nontender. No organomegaly or masses felt. Normal bowel sounds heard. Central nervous system: Alert, . No focal neurological deficits. Extremities: Symmetric 5 x 5 power. Skin: No rashes, lesions or ulcers Psychiatry: Alert, answer yes and no questions.     Data Reviewed: I have personally reviewed following labs and imaging studies  CBC:  Recent Labs Lab 05/17/16 0027 05/18/16 0310  WBC 12.0* 11.2*  NEUTROABS 8.8*  --   HGB 12.4 11.0*  HCT 37.0 33.1*  MCV 90.7 89.9  PLT 143* 118*   Basic Metabolic Panel:  Recent Labs Lab 05/17/16 0027 05/17/16 0801 05/17/16  1859 05/18/16 0310  NA 147* 150* 142 142  K 3.8 3.8 3.1* 3.2*  CL 112* 119* 114* 111  CO2 GLUCOSE 555* 166* 212* 93  BUN 36* 28* 24* 21*  CREATININE 0.97 0.69 0.63 0.50  CALCIUM 9.4 8.9 8.5* 8.3*   GFR: Estimated Creatinine Clearance: 43.3 mL/min (by C-G formula based on Cr of 0.5). Liver Function Tests:  Recent Labs Lab 05/17/16 0027  AST 20  ALT 15  ALKPHOS 152*    BILITOT 0.2*  PROT 7.6  ALBUMIN 3.2*   No results for input(s): LIPASE, AMYLASE in the last 168 hours. No results for input(s): AMMONIA in the last 168 hours. Coagulation Profile: No results for input(s): INR, PROTIME in the last 168 hours. Cardiac Enzymes: No results for input(s): CKTOTAL, CKMB, CKMBINDEX, TROPONINI in the last 168 hours. BNP (last 3 results) No results for input(s): PROBNP in the last 8760 hours. HbA1C: No results for input(s): HGBA1C in the last 72 hours. CBG:  Recent Labs Lab 05/17/16 1451 05/17/16 1746 05/17/16 1935 05/18/16 0725 05/18/16 0750  GLUCAP 224* 249* 186* 68 73   Lipid Profile: No results for input(s): CHOL, HDL, LDLCALC, TRIG, CHOLHDL, LDLDIRECT in the last 72 hours. Thyroid Function Tests: No results for input(s): TSH, T4TOTAL, FREET4, T3FREE, THYROIDAB in the last 72 hours. Anemia Panel: No results for input(s): VITAMINB12, FOLATE, FERRITIN, TIBC, IRON, RETICCTPCT in the last 72 hours. Sepsis Labs:  Recent Labs Lab 05/17/16 0513  PROCALCITON 0.16    Recent Results (from the past 240 hour(s))  MRSA PCR Screening     Status: None   Collection Time: 05/17/16  3:36 AM  Result Value Ref Range Status   MRSA by PCR NEGATIVE NEGATIVE Final    Comment:        The GeneXpert MRSA Assay (FDA approved for NASAL specimens only), is one component of a comprehensive MRSA colonization surveillance program. It is not intended to diagnose MRSA infection nor to guide or monitor treatment for MRSA infections.          Radiology Studies: No results found.      Scheduled Meds: . cefTRIAXone (ROCEPHIN)  IV  1 g Intravenous Q24H  . diltiazem  90 mg Oral Q8H  . donepezil  10 mg Oral Daily  . enoxaparin (LOVENOX) injection  30 mg Subcutaneous Q24H  . insulin aspart  0-9 Units Subcutaneous TID WC  . insulin detemir  10 Units Subcutaneous BID  . lisinopril  40 mg Oral Daily  . multivitamin  15 mL Oral Daily  . nebivolol  5 mg Oral  Daily  . predniSONE  1 mg Oral Daily  . sertraline  50 mg Oral QHS   Continuous Infusions:     LOS: 1 day    Time spent: 35 minutes.     Alba Cory, MD [facility] Pager [phone]  If 7PM-7AM, please contact night-coverage [email or web address] Password [facility] 05/18/2016, 7:58 AM

## 2016-05-18 NOTE — Progress Notes (Signed)
Hypoglycemic Event  CBG: 68  Treatment: 15 GM carbohydrate snack  Symptoms: None  Follow-up CBG: Time:0750 CBG Result:73  Possible Reasons for Event: Inadequate meal intake  Comments/MD notified: breakfast ordered    Arlyss RepressShaffer, Mitchell HeirShae Lee Nicole

## 2016-05-18 NOTE — Progress Notes (Signed)
Family friend in to feed pt. Appetite very good.

## 2016-05-19 DIAGNOSIS — E1165 Type 2 diabetes mellitus with hyperglycemia: Secondary | ICD-10-CM | POA: Diagnosis not present

## 2016-05-19 DIAGNOSIS — Z23 Encounter for immunization: Secondary | ICD-10-CM | POA: Diagnosis not present

## 2016-05-19 DIAGNOSIS — M316 Other giant cell arteritis: Secondary | ICD-10-CM | POA: Diagnosis not present

## 2016-05-19 DIAGNOSIS — D696 Thrombocytopenia, unspecified: Secondary | ICD-10-CM | POA: Diagnosis not present

## 2016-05-19 DIAGNOSIS — N3000 Acute cystitis without hematuria: Secondary | ICD-10-CM | POA: Diagnosis not present

## 2016-05-19 DIAGNOSIS — Z7952 Long term (current) use of systemic steroids: Secondary | ICD-10-CM | POA: Diagnosis not present

## 2016-05-19 DIAGNOSIS — E43 Unspecified severe protein-calorie malnutrition: Secondary | ICD-10-CM | POA: Diagnosis not present

## 2016-05-19 DIAGNOSIS — E860 Dehydration: Secondary | ICD-10-CM

## 2016-05-19 DIAGNOSIS — E11649 Type 2 diabetes mellitus with hypoglycemia without coma: Secondary | ICD-10-CM | POA: Diagnosis not present

## 2016-05-19 DIAGNOSIS — I10 Essential (primary) hypertension: Secondary | ICD-10-CM | POA: Diagnosis not present

## 2016-05-19 DIAGNOSIS — Z681 Body mass index (BMI) 19 or less, adult: Secondary | ICD-10-CM | POA: Diagnosis not present

## 2016-05-19 DIAGNOSIS — Z833 Family history of diabetes mellitus: Secondary | ICD-10-CM | POA: Diagnosis not present

## 2016-05-19 DIAGNOSIS — G9340 Encephalopathy, unspecified: Secondary | ICD-10-CM | POA: Diagnosis not present

## 2016-05-19 DIAGNOSIS — Z8249 Family history of ischemic heart disease and other diseases of the circulatory system: Secondary | ICD-10-CM | POA: Diagnosis not present

## 2016-05-19 DIAGNOSIS — F0390 Unspecified dementia without behavioral disturbance: Secondary | ICD-10-CM | POA: Diagnosis not present

## 2016-05-19 DIAGNOSIS — Z794 Long term (current) use of insulin: Secondary | ICD-10-CM | POA: Diagnosis not present

## 2016-05-19 DIAGNOSIS — Z79899 Other long term (current) drug therapy: Secondary | ICD-10-CM | POA: Diagnosis not present

## 2016-05-19 DIAGNOSIS — Z87440 Personal history of urinary (tract) infections: Secondary | ICD-10-CM | POA: Diagnosis not present

## 2016-05-19 DIAGNOSIS — E870 Hyperosmolality and hypernatremia: Secondary | ICD-10-CM | POA: Diagnosis not present

## 2016-05-19 LAB — URINE CULTURE

## 2016-05-19 LAB — CBC
HCT: 32.9 % — ABNORMAL LOW (ref 36.0–46.0)
Hemoglobin: 11.4 g/dL — ABNORMAL LOW (ref 12.0–15.0)
MCH: 29.9 pg (ref 26.0–34.0)
MCHC: 34.7 g/dL (ref 30.0–36.0)
MCV: 86.4 fL (ref 78.0–100.0)
PLATELETS: 108 10*3/uL — AB (ref 150–400)
RBC: 3.81 MIL/uL — ABNORMAL LOW (ref 3.87–5.11)
RDW: 13.8 % (ref 11.5–15.5)
WBC: 7.4 10*3/uL (ref 4.0–10.5)

## 2016-05-19 LAB — BASIC METABOLIC PANEL
Anion gap: 5 (ref 5–15)
BUN: 13 mg/dL (ref 6–20)
CALCIUM: 8.3 mg/dL — AB (ref 8.9–10.3)
CHLORIDE: 106 mmol/L (ref 101–111)
CO2: 25 mmol/L (ref 22–32)
CREATININE: 0.49 mg/dL (ref 0.44–1.00)
GFR calc Af Amer: >60 mL/min (ref >60)
Glucose, Bld: 140 mg/dL — ABNORMAL HIGH (ref 65–99)
Potassium: 4 mmol/L (ref 3.5–5.1)
SODIUM: 136 mmol/L (ref 135–145)

## 2016-05-19 LAB — GLUCOSE, CAPILLARY
GLUCOSE-CAPILLARY: 104 mg/dL — AB (ref 65–99)
GLUCOSE-CAPILLARY: 261 mg/dL — AB (ref 65–99)

## 2016-05-19 MED ORDER — DILTIAZEM HCL 90 MG PO TABS: 90.0000 mg | ORAL_TABLET | Freq: Three times a day (TID) | ORAL | Status: DC

## 2016-05-19 MED ORDER — CEPHALEXIN 500 MG PO CAPS: 500.0000 mg | ORAL_CAPSULE | Freq: Two times a day (BID) | ORAL | Status: DC

## 2016-05-19 MED ORDER — GLUCERNA PO LIQD: 237.0000 mL | Freq: Two times a day (BID) | ORAL | Status: AC

## 2016-05-19 MED ORDER — CEPHALEXIN 250 MG/5ML PO SUSR
500.0000 mg | Freq: Two times a day (BID) | ORAL | Status: DC
  Filled 2016-05-19: qty 10

## 2016-05-19 MED ORDER — CEPHALEXIN 250 MG/5ML PO SUSR: 500.0000 mg | Freq: Two times a day (BID) | ORAL | Status: DC

## 2016-05-19 MED ORDER — ENSURE ENLIVE PO LIQD: 237.0000 mL | Freq: Two times a day (BID) | ORAL | Status: DC

## 2016-05-19 MED ORDER — ACETAMINOPHEN 325 MG PO TABS: 650.0000 mg | ORAL_TABLET | Freq: Four times a day (QID) | ORAL | Status: DC | PRN

## 2016-05-19 NOTE — Care Management Note (Signed)
Case Management Note  Patient Details  Name: Diana Hartman MRN: [id number] Date of Birth: 07/22/1941  Subjective/Objective:          75 yo admitted with UTI          Action/Plan: Pt from home. Per daughter Stanton KidneyDebra, pt's son lives with her and both daughters help as well.  PT recommendations gone over with Stanton KidneyDebra as pt has dementia. Stanton KidneyDebra contacted on her work number [phone]). Choice was offered for HHPT and [facility] was chosen. [facility] rep contacted for referral. Stanton KidneyDebra also asking for a [employer] lift. [facility]  No other CM needs communicated.  Expected Discharge Date:                  Expected Discharge Plan:  Home w Home Health Services  In-House Referral:     Discharge planning Services  CM Consult  Post Acute Care Choice:  Home Health Choice offered to:  Adult Children  DME Arranged:  Other see comment Water quality scientist(hoyer) DME Agency:  [facility]  HH Arranged:  PT HH Agency:  [facility]  Status of Service:  In process, will continue to follow  Medicare Important Message Given:    Date Medicare IM Given:    Medicare IM give by:    Date Additional Medicare IM Given:    Additional Medicare Important Message give by:     If discussed at Long Length of Stay Meetings, dates discussed:    Additional CommentsBartholome Bill:  Diana Scerbo H, RN 05/19/2016, 12:24 PM  [phone]

## 2016-05-19 NOTE — Discharge Summary (Addendum)
Physician Discharge Summary  Diana Hartman ZOX:[id number] DOB: 08-12-1941 DOA: 05/16/2016  PCP: Michiel Sites, MD  Admit date: 05/16/2016 Discharge date: 05/19/2016  Admitted From: Home. Disposition:  Home   Recommendations for Outpatient Follow-up:  1. Follow up with PCP in 1-2 weeks 2. Please obtain BMP/CBC in one week    Home Health: Yes  Discharge Condition:Stable CODE STATUS: FULL Diet recommendation:  Carb Modified, soft food   Brief/Interim Summary: Diana Hartman is a 75 y.o. female with medical history significant for insulin-dependent diabetes mellitus, hypertension, and dementia who presents to the ED with increased confusion and lethargy as well as elevated blood sugars.   Patient admitted with acute encephalopathy, UTI, Hypernatremia.    1-Acute encephalopathy: presents with lethargic, suspect related to electrolytes abnormalities, hyperglycemia and infection.  Treating underline cause.  Resolved.   2-UTI; UA with too numerous to count WBC.  Continue with ceftriaxone day 3.  urine culture growing proteus. Discharge on 2 more days of keflex.   3-Uncontrolled DM, Hyperglycemia:  She was transition from insulin Gtt to levemir. levemir 10 units BID.  Resume metformin at discharge   4-Hypernatremia: resolved with D 5 IV fluids.   5-HNT; better controlled this am.  continue with Diltiazem, Lisinopril, Bystolic.  Will use PRN hydralazine for uncontrolled BP.   6-Dementia:  Continue with donepezil, Zoloft   7-Temporal arteritis -Continue prednisone  8-Thrombocytopenia; hold Lovenox, start SCD. Suspect related to acute illness.  Needs labs in 1 week    Discharge Diagnoses:    UTI (lower urinary tract infection)   Diabetes mellitus, type II (HCC), hyperglycemia, uncontrolled.     Acute encephalopathy   Essential hypertension   Dementia   Hyperglycemia   Hypernatremia   Dehydration   Acute cystitis without hematuria   Hyperglycemia  without ketosis   Insulin dependent diabetes mellitus (HCC)   Protein-calorie malnutrition, severe    Discharge Instructions      Discharge Instructions    Diet Carb Modified    Complete by:  As directed      Increase activity slowly    Complete by:  As directed             Medication List    STOP taking these medications        diltiazem 240 MG 24 hr capsule  Commonly known as:  TIAZAC     glipiZIDE 5 MG tablet  Commonly known as:  GLUCOTROL      TAKE these medications        acetaminophen 325 MG tablet  Commonly known as:  TYLENOL  Take 2 tablets (650 mg total) by mouth every 6 (six) hours as needed for mild pain (or Fever >/= 101).     cephALEXin 250 MG/5ML suspension  Commonly known as:  KEFLEX  Take 10 mLs (500 mg total) by mouth every 12 (twelve) hours.     diltiazem 90 MG tablet  Commonly known as:  CARDIZEM  Take 1 tablet (90 mg total) by mouth every 8 (eight) hours.     donepezil 10 MG tablet  Commonly known as:  ARICEPT  Take 10 mg by mouth daily.     Geritol Liqd  Take 5 mLs by mouth daily.     GLUCERNA Liqd  Take 237 mLs by mouth 2 (two) times daily between meals.     insulin detemir 100 UNIT/ML injection  Commonly known as:  LEVEMIR  Inject 0.1 mLs (10 Units total) into the skin 2 (two) times daily.  lisinopril 40 MG tablet  Commonly known as:  PRINIVIL,ZESTRIL  Take 40 mg by mouth daily.     metFORMIN 1000 MG tablet  Commonly known as:  GLUCOPHAGE  Take 1,000 mg by mouth daily with breakfast.     nebivolol 5 MG tablet  Commonly known as:  BYSTOLIC  Take 5 mg by mouth daily.     predniSONE 1 MG tablet  Commonly known as:  DELTASONE  Take 1 mg by mouth daily.     sertraline 50 MG tablet  Commonly known as:  ZOLOFT  Take 50 mg by mouth at bedtime.     traMADol 50 MG tablet  Commonly known as:  ULTRAM  Take 1 tablet (50 mg total) by mouth every 6 (six) hours as needed.     zolpidem 10 MG tablet  Commonly known as:  AMBIEN   Take 10 mg by mouth at bedtime.       Follow-up Information    Follow up with Michiel Sites, MD In 1 week.   Specialty:  Endocrinology   Contact information:   [address] [place] [address] [phone]       Follow up with [facility]   Contact information:   [address] [place] [address] [phone]      No Known Allergies  Consultations:  none   Procedures/Studies: No results found.    Subjective: Patient alert, pleasantly confuse. Eating breakfast   Discharge Exam: Filed Vitals:   05/19/16 1111 05/19/16 1429  BP: 132/64 132/64  Pulse:    Temp:    Resp:     Filed Vitals:   05/18/16 2049 05/19/16 0700 05/19/16 1111 05/19/16 1429  BP: 138/77 131/75 132/64 132/64  Pulse: 69 64    Temp: 98.7 F (37.1 C) 98.1 F (36.7 C)    TempSrc: Oral Oral    Resp: 20 20    Height:      Weight:      SpO2: 100% 100%      General: Pt is alert, awake, not in acute distress Cardiovascular: RRR, S1/S2 +, no rubs, no gallops Respiratory: CTA bilaterally, no wheezing, no rhonchi Abdominal: Soft, NT, ND, bowel sounds + Extremities: no edema, no cyanosis    The results of significant diagnostics from this hospitalization (including imaging, microbiology, ancillary and laboratory) are listed below for reference.     Microbiology: Recent Results (from the past 240 hour(s))  Urine culture     Status: Abnormal   Collection Time: 05/17/16  1:50 AM  Result Value Ref Range Status   Specimen Description URINE, RANDOM  Final   Special Requests NONE  Final   Culture >=100,000 COLONIES/mL PROTEUS MIRABILIS (A)  Final   Report Status 05/19/2016 FINAL  Final   Organism ID, Bacteria PROTEUS MIRABILIS (A)  Final      Susceptibility   Proteus mirabilis - MIC*    AMPICILLIN <=2 SENSITIVE Sensitive     CEFAZOLIN <=4 SENSITIVE Sensitive     CEFTRIAXONE <=1 SENSITIVE Sensitive     CIPROFLOXACIN <=0.25 SENSITIVE  Sensitive     GENTAMICIN <=1 SENSITIVE Sensitive     IMIPENEM 8 INTERMEDIATE Intermediate     NITROFURANTOIN 128 RESISTANT Resistant     TRIMETH/SULFA <=20 SENSITIVE Sensitive     AMPICILLIN/SULBACTAM <=2 SENSITIVE Sensitive     PIP/TAZO <=4 SENSITIVE Sensitive     * >=100,000 COLONIES/mL PROTEUS MIRABILIS  MRSA PCR Screening     Status: None   Collection  Time: 05/17/16  3:36 AM  Result Value Ref Range Status   MRSA by PCR NEGATIVE NEGATIVE Final    Comment:        The GeneXpert MRSA Assay (FDA approved for NASAL specimens only), is one component of a comprehensive MRSA colonization surveillance program. It is not intended to diagnose MRSA infection nor to guide or monitor treatment for MRSA infections.      Labs: BNP (last 3 results) No results for input(s): BNP in the last 8760 hours. Basic Metabolic Panel:  Recent Labs Lab 05/17/16 0027 05/17/16 0801 05/17/16 1859 05/18/16 0310 05/19/16 0343  NA 147* 150* 142 142 136  K 3.8 3.8 3.1* 3.2* 4.0  CL 112* 119* 114* 111 106  CO2 29 23 23 26 25   GLUCOSE 555* 166* 212* 93 140*  BUN 36* 28* 24* 21* 13  CREATININE 0.97 0.69 0.63 0.50 0.49  CALCIUM 9.4 8.9 8.5* 8.3* 8.3*   Liver Function Tests:  Recent Labs Lab 05/17/16 0027  AST 20  ALT 15  ALKPHOS 152*  BILITOT 0.2*  PROT 7.6  ALBUMIN 3.2*   No results for input(s): LIPASE, AMYLASE in the last 168 hours. No results for input(s): AMMONIA in the last 168 hours. CBC:  Recent Labs Lab 05/17/16 0027 05/18/16 0310 05/19/16 0343  WBC 12.0* 11.2* 7.4  NEUTROABS 8.8*  --   --   HGB 12.4 11.0* 11.4*  HCT 37.0 33.1* 32.9*  MCV 90.7 89.9 86.4  PLT 143* 118* 108*   Cardiac Enzymes: No results for input(s): CKTOTAL, CKMB, CKMBINDEX, TROPONINI in the last 168 hours. BNP: Invalid input(s): POCBNP CBG:  Recent Labs Lab 05/18/16 1154 05/18/16 1746 05/18/16 2046 05/19/16 0752 05/19/16 1235  GLUCAP 300* 265* 169* 104* 261*   D-Dimer No results for  input(s): DDIMER in the last 72 hours. Hgb A1c No results for input(s): HGBA1C in the last 72 hours. Lipid Profile No results for input(s): CHOL, HDL, LDLCALC, TRIG, CHOLHDL, LDLDIRECT in the last 72 hours. Thyroid function studies No results for input(s): TSH, T4TOTAL, T3FREE, THYROIDAB in the last 72 hours.  Invalid input(s): FREET3 Anemia work up No results for input(s): VITAMINB12, FOLATE, FERRITIN, TIBC, IRON, RETICCTPCT in the last 72 hours. Urinalysis    Component Value Date/Time   COLORURINE YELLOW 05/16/2016 2344   APPEARANCEUR TURBID* 05/16/2016 2344   LABSPEC 1.038* 05/16/2016 2344   PHURINE 7.0 05/16/2016 2344   GLUCOSEU >1000* 05/16/2016 2344   HGBUR SMALL* 05/16/2016 2344   BILIRUBINUR NEGATIVE 05/16/2016 2344   KETONESUR NEGATIVE 05/16/2016 2344   PROTEINUR NEGATIVE 05/16/2016 2344   UROBILINOGEN 1.0 07/04/2015 1220   NITRITE NEGATIVE 05/16/2016 2344   LEUKOCYTESUR LARGE* 05/16/2016 2344   Sepsis Labs Invalid input(s): PROCALCITONIN,  WBC,  LACTICIDVEN Microbiology Recent Results (from the past 240 hour(s))  Urine culture     Status: Abnormal   Collection Time: 05/17/16  1:50 AM  Result Value Ref Range Status   Specimen Description URINE, RANDOM  Final   Special Requests NONE  Final   Culture >=100,000 COLONIES/mL PROTEUS MIRABILIS (A)  Final   Report Status 05/19/2016 FINAL  Final   Organism ID, Bacteria PROTEUS MIRABILIS (A)  Final      Susceptibility   Proteus mirabilis - MIC*    AMPICILLIN <=2 SENSITIVE Sensitive     CEFAZOLIN <=4 SENSITIVE Sensitive     CEFTRIAXONE <=1 SENSITIVE Sensitive     CIPROFLOXACIN <=0.25 SENSITIVE Sensitive     GENTAMICIN <=1 SENSITIVE Sensitive  IMIPENEM 8 INTERMEDIATE Intermediate     NITROFURANTOIN 128 RESISTANT Resistant     TRIMETH/SULFA <=20 SENSITIVE Sensitive     AMPICILLIN/SULBACTAM <=2 SENSITIVE Sensitive     PIP/TAZO <=4 SENSITIVE Sensitive     * >=100,000 COLONIES/mL PROTEUS MIRABILIS  MRSA PCR Screening      Status: None   Collection Time: 05/17/16  3:36 AM  Result Value Ref Range Status   MRSA by PCR NEGATIVE NEGATIVE Final    Comment:        The GeneXpert MRSA Assay (FDA approved for NASAL specimens only), is one component of a comprehensive MRSA colonization surveillance program. It is not intended to diagnose MRSA infection nor to guide or monitor treatment for MRSA infections.      Time coordinating discharge: Over 30 minutes  SIGNED:   Alba Cory, MD  [facility] 05/19/2016, 2:48 PM Pager [phone]  If 7PM-7AM, please contact night-coverage [email or web address] Password [email or web address]

## 2016-05-19 NOTE — Evaluation (Signed)
Physical Therapy Evaluation Patient Details Name: Diana Hartman MRN: [id number] DOB: November 01, 1941 Today's Date: 05/19/2016   History of Present Illness  Diana Hartman is a 75 y.o. female with medical history significant for insulin-dependent diabetes mellitus, hypertension, and dementia who presents to the ED 6/12/17with increased confusion and lethargy as well as elevated blood sugars Patient admitted with acute encephalopathy, UTI, Hypernatremia,hyper glycemia  Clinical Impression  The patient is pleasantly confused, unable to follow verbal commands. Assisted with max/total assist to sitting  And attempted to stand but unable with  1 assist. No family present. Patient incontinent of BM. Pt admitted with above diagnosis. Pt currently with functional limitations due to the deficits listed below (see PT Problem List). Pt will benefit from skilled PT to increase their independence and safety with mobility to allow discharge to the venue listed below.        Follow Up Recommendations Home health PT;Supervision/Assistance - 24 hour (no family present for prior function, currently requires 2 assist.)    Equipment Recommendations  None recommended by PT    Recommendations for Other Services       Precautions / Restrictions Precautions Precautions: Fall Restrictions Weight Bearing Restrictions: No      Mobility  Bed Mobility Overal bed mobility: Needs Assistance Bed Mobility: Supine to Sit;Rolling;Sit to Supine Rolling: Max assist   Supine to sit: Total assist Sit to supine: Total assist   General bed mobility comments: patient  at times follows  gestures and tactile cues inconsistently  Transfers Overall transfer level: Needs assistance   Transfers: Sit to/from Stand;Stand Pivot Transfers           General transfer comment: unable to stand with just 1 person at the bedside  Ambulation/Gait                Stairs            Wheelchair Mobility     Modified Rankin (Stroke Patients Only)       Balance Overall balance assessment: Needs assistance Sitting-balance support: Bilateral upper extremity supported;Feet supported Sitting balance-Leahy Scale: Poor Sitting balance - Comments: pfalling backwards without assistance./ Postural control: Posterior lean                                   Pertinent Vitals/Pain Pain Assessment: Faces Faces Pain Scale: No hurt    Home Living Family/patient expects to be discharged to:: Private residence Living Arrangements: Children               Additional Comments: No family present for information, patient unable    Prior Function Level of Independence: Needs assistance         Comments: no info available     Hand Dominance        Extremity/Trunk Assessment   Upper Extremity Assessment: Generalized weakness           Lower Extremity Assessment: Generalized weakness      Cervical / Trunk Assessment: Kyphotic  Communication      Cognition Arousal/Alertness: Awake/alert Behavior During Therapy: WFL for tasks assessed/performed Overall Cognitive Status: No family/caregiver present to determine baseline cognitive functioning (H/O dementaia)                      General Comments      Exercises        Assessment/Plan    PT Assessment Patient needs continued PT  services  PT Diagnosis Generalized weakness;Altered mental status   PT Problem List Decreased strength;Decreased activity tolerance;Decreased balance;Decreased mobility;Decreased cognition  PT Treatment Interventions Functional mobility training;Therapeutic activities;Patient/family education   PT Goals (Current goals can be found in the Care Plan section) Acute Rehab PT Goals Patient Stated Goal: none  stated PT Goal Formulation: Patient unable to participate in goal setting Time For Goal Achievement: 06/02/16 Potential to Achieve Goals: Good    Frequency Min 3X/week    Barriers to discharge        Co-evaluation               End of Session Equipment Utilized During Treatment: Gait belt Activity Tolerance: Patient tolerated treatment well Patient left: with call bell/phone within reach;with bed alarm set Nurse Communication: Mobility status         Time: 7829-5621 PT Time Calculation (min) (ACUTE ONLY): 28 min   Charges:   PT Evaluation $PT Eval Low Complexity: 1 Procedure PT Treatments $Therapeutic Activity: 8-22 mins   PT G Codes:        Rada Hay 05/19/2016, 10:39 AM Blanchard Kelch PT [phone]

## 2016-05-19 NOTE — Evaluation (Signed)
Clinical/Bedside Swallow Evaluation Patient Details  Name: Diana Hartman MRN: [id number] Date of Birth: 08-16-1941  Today's Date: 05/19/2016 Time: SLP Start Time (ACUTE ONLY): 1250 SLP Stop Time (ACUTE ONLY): 1340 SLP Time Calculation (min) (ACUTE ONLY): 50 min  Past Medical History:  Past Medical History  Diagnosis Date  . Diabetes mellitus   . Hypertension   . Temporal arteritis (HCC)   . Dementia    Past Surgical History: History reviewed. No pertinent past surgical history. HPI:  75 [facility], pt found to have blood sugars elevated- also with UTI. PMH + for dementia.  Daughter reported to MD that pt occasionally "chokes" on drinks, MD ordered swallow evaluation.     Assessment / Plan / Recommendation Clinical Impression  Pt presents with cognitive based dysphagia, no indications of airway compromise with po observed (grape juice/gingerale mix, mashed potatoes, fish).  She does demonstrate suspected delayed oral manipulation/transiting but no significant residuals.  Pt has not had pneumonias per daughter and will "choke" occasionally on liquids.  Educated daughter Lafonda MossesDiana to dysphagia migitation strategies - including using puree to aid oral transiting of solids in lieu of thin liquids.  Also discussed gustatory and sensory changes with dysphagia/dementia.  All education completed and pt appears to be managing well.  Thanks for this order    Aspiration Risk  Mild aspiration risk    Diet Recommendation Regular;Thin liquid (soft foods due to edentulous)   Liquid Administration via: Cup;Straw Medication Administration: Crushed with puree Supervision: Patient able to self feed;Full supervision/cueing for compensatory strategies (encourage pt to self feed) Compensations: Minimize environmental distractions;Slow rate;Small sips/bites (use puree to aid oral clearance ) Postural Changes: Seated upright at 90 degrees;Remain upright for at least 30 minutes after po  intake    Other  Recommendations Oral Care Recommendations: Oral care BID   Follow up Recommendations  None    Frequency and Duration     n/a       Prognosis   n/a     Swallow Study   General Date of Onset: 05/19/16 HPI: 75 [facility], pt found to have blood sugars elevated- also with UTI. PMH + for dementia.  Daughter reported to MD that pt occasionally "chokes" on drinks, MD ordered swallow evaluation.   Type of Study: Bedside Swallow Evaluation Diet Prior to this Study: Regular;Thin liquids Temperature Spikes Noted: No Respiratory Status: Room air History of Recent Intubation: No Behavior/Cognition: Alert;Cooperative;Pleasant mood;Doesn't follow directions (intermittently follows directions) Oral Cavity Assessment: Within Functional Limits Oral Care Completed by SLP: No Oral Cavity - Dentition: Adequate natural dentition (few lower teeth only) Vision: Functional for self-feeding Self-Feeding Abilities: Able to feed self (needs set up assist) Patient Positioning: Upright in bed Baseline Vocal Quality: Low vocal intensity Volitional Cough: Cognitively unable to elicit Volitional Swallow: Unable to elicit    Oral/Motor/Sensory Function Overall Oral Motor/Sensory Function: Within functional limits   Ice Chips Ice chips: Not tested   Thin Liquid Thin Liquid: Impaired Presentation: Self Fed;Straw Oral Phase Functional Implications: Prolonged oral transit Pharyngeal  Phase Impairments: Suspected delayed Swallow    Nectar Thick Nectar Thick Liquid: Not tested   Honey Thick Honey Thick Liquid: Not tested   Puree Puree: Impaired Presentation: Self Fed;Spoon Oral Phase Functional Implications: Prolonged oral transit Pharyngeal Phase Impairments: Suspected delayed Swallow   Solid   GO   Solid: Impaired Presentation: Self Fed Oral Phase Impairments: Reduced lingual movement/coordination;Impaired mastication Oral Phase Functional Implications: Prolonged  oral transit Pharyngeal Phase Impairments: Suspected delayed Swallow        Mills Koller, MS [facility] SLP [phone]

## 2016-05-19 NOTE — Progress Notes (Signed)
Discharge instructions explained to pt's daughter and scripts given to daughter. Pt unable to stand, it takes to, to transfer pt. Daughter taking pt to her son's where she lives.Dr Sunnie Nielsenegalado texted for pt to get script for glycerna instead of ensure, pt is diabetic. MD wrote for script after pt was dc'd.

## 2016-06-15 DIAGNOSIS — N390 Urinary tract infection, site not specified: Secondary | ICD-10-CM | POA: Diagnosis not present

## 2016-06-15 DIAGNOSIS — E118 Type 2 diabetes mellitus with unspecified complications: Secondary | ICD-10-CM | POA: Diagnosis not present

## 2016-07-15 DIAGNOSIS — N390 Urinary tract infection, site not specified: Secondary | ICD-10-CM | POA: Diagnosis not present

## 2017-02-11 DIAGNOSIS — N390 Urinary tract infection, site not specified: Secondary | ICD-10-CM | POA: Diagnosis not present

## 2017-07-08 IMAGING — CT CT CERVICAL SPINE W/O CM
4 of 5 series · 15 of 33 positions shown, 18 images · non-contrast
Comparison: MRI brain 04/18/2012. Cervical spine radiographs
02/03/2012.

CLINICAL DATA: Fall today without injury. Altered mental status and
unresponsive. Initial encounter.

EXAM:
CT HEAD WITHOUT CONTRAST
CT CERVICAL SPINE WITHOUT CONTRAST
TECHNIQUE: Multidetector CT imaging of the head and cervical spine was
performed following the standard protocol without intravenous
contrast. Multiplanar CT image reconstructions of the cervical spine
were also generated.

[Series 5: c_spine 2.0 i40s 3 · axial · 0.32mm/px · z∈[-192,-160]mm · 2 of 66 slices shown]
[im 17/66  bone]
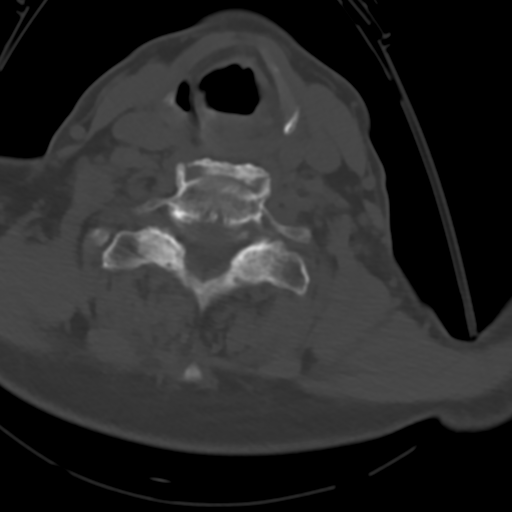
[im 33/66  bone]
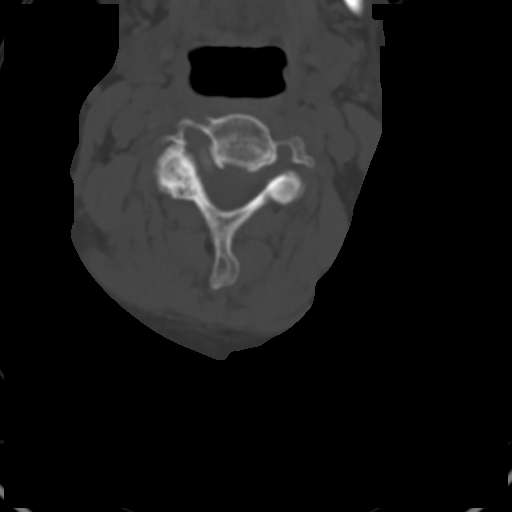

[Series 7: coronals · coronal · 0.28mm/px · 3 of 115 slices shown]
[im 29/115  bone]
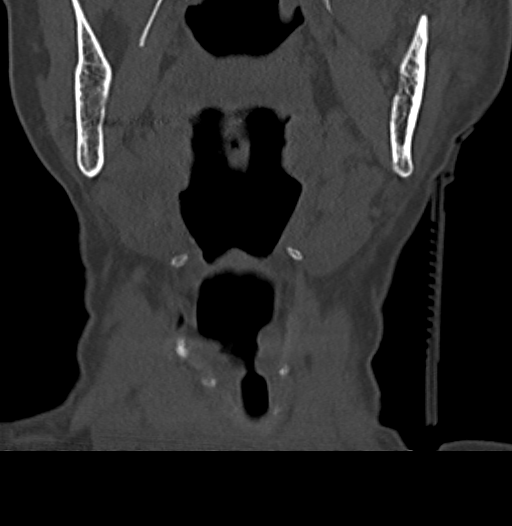
[im 48/115  bone]
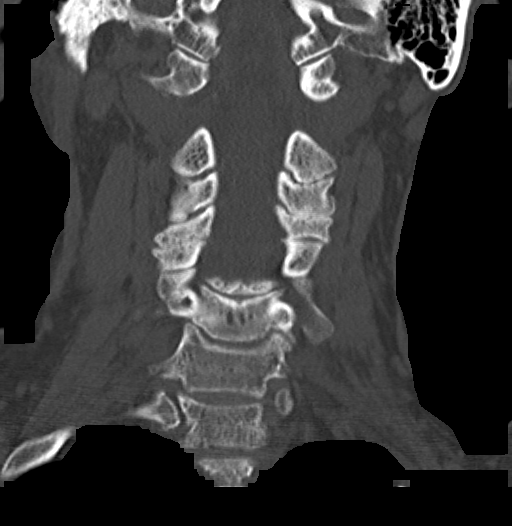
[im 67/115  bone]
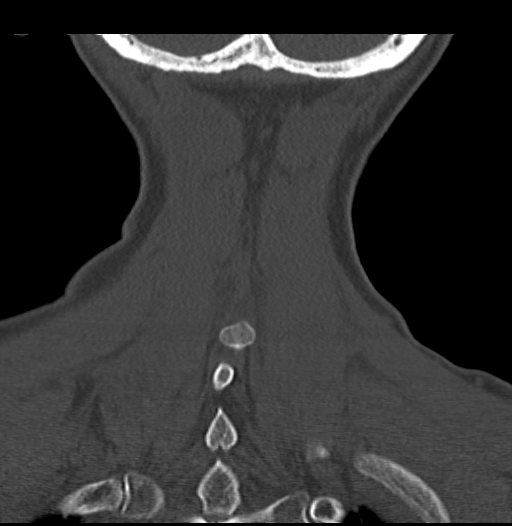

[Series 8: sagittals · sagittal · 0.26mm/px · 5 of 83 slices shown, 6 images]
[im 28/83  bone]
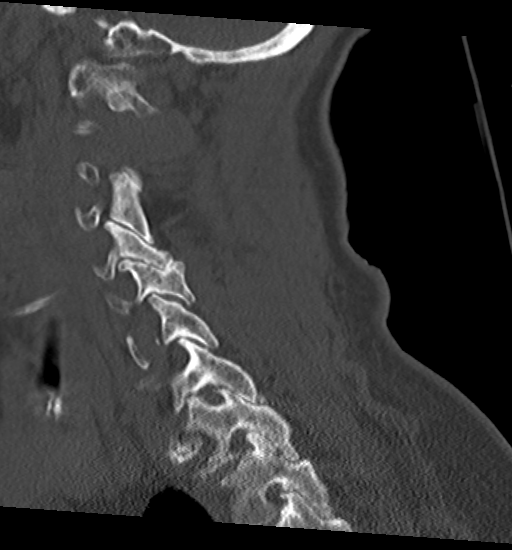
[im 35/83  bone]
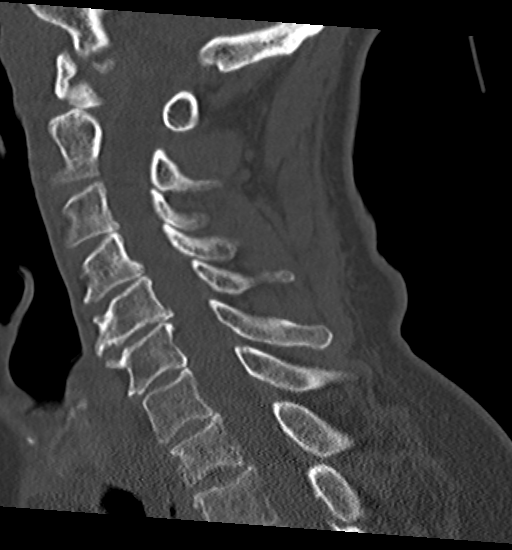
[im 42/83  soft-tissue]
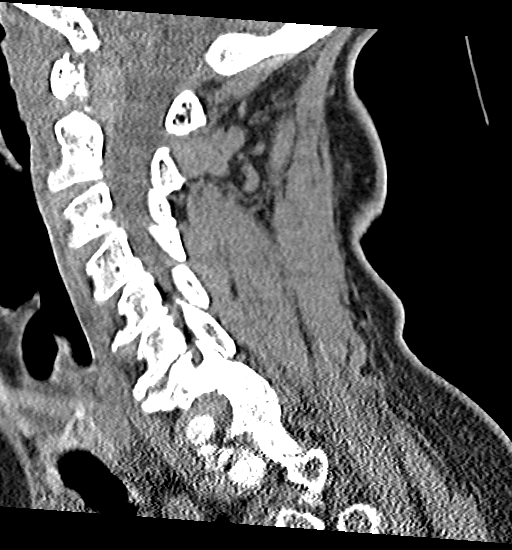
[im 42/83  bone]
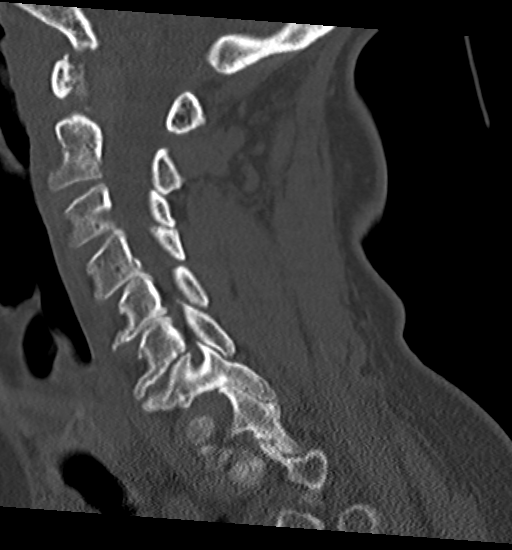
[im 48/83  bone]
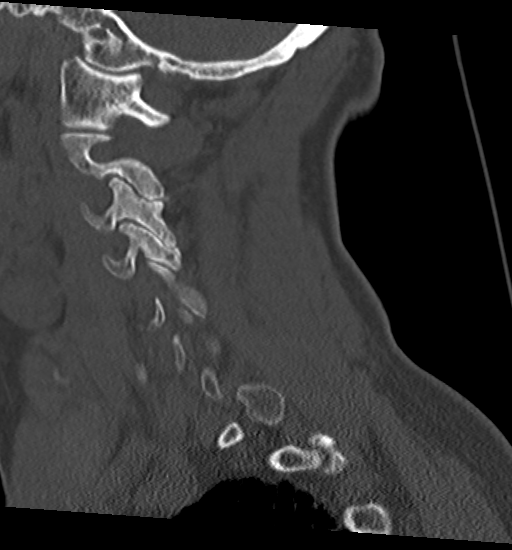
[im 55/83  bone]
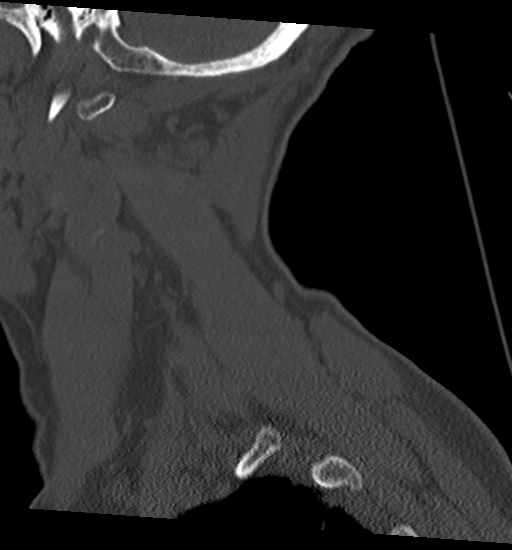

[Series 9: orthogonals · axial · 0.32mm/px · z∈[-243,-140]mm · 5 of 85 slices shown, 7 images]
[im 15/85  soft-tissue]
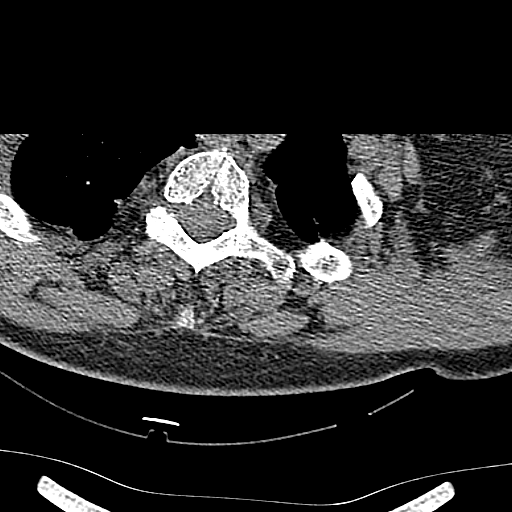
[im 15/85  bone]
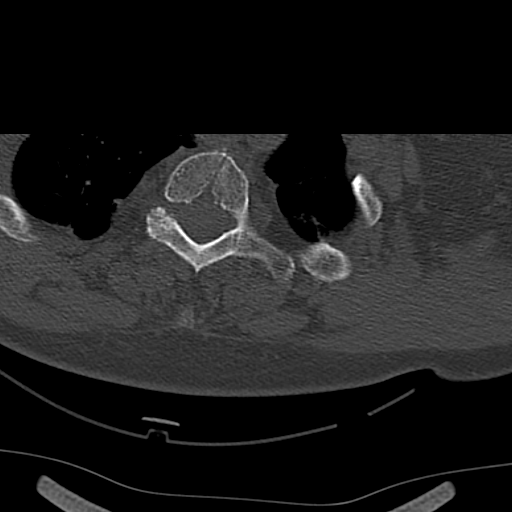
[im 29/85  bone]
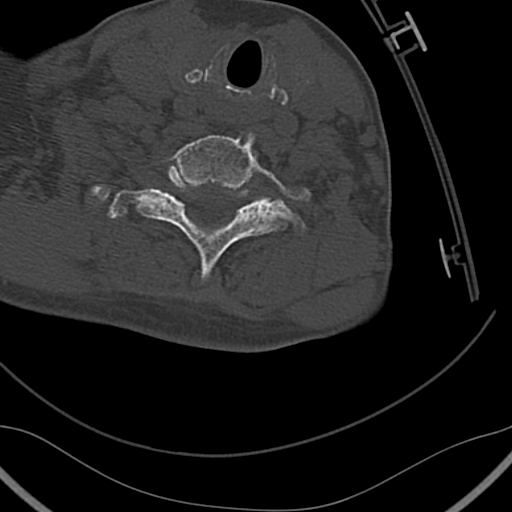
[im 43/85  bone]
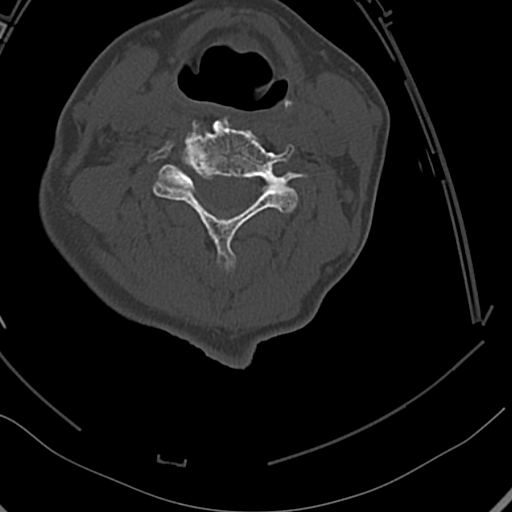
[im 57/85  bone]
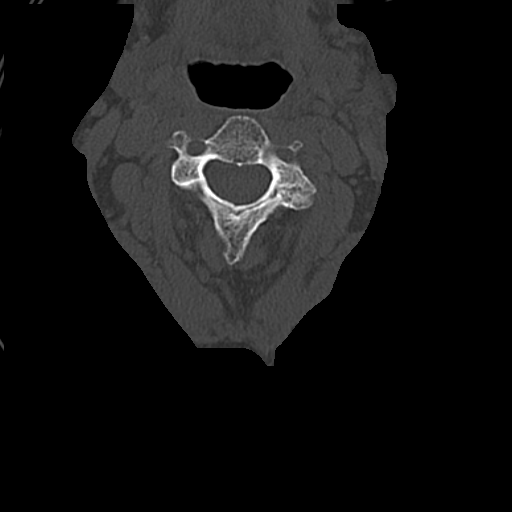
[im 71/85  soft-tissue]
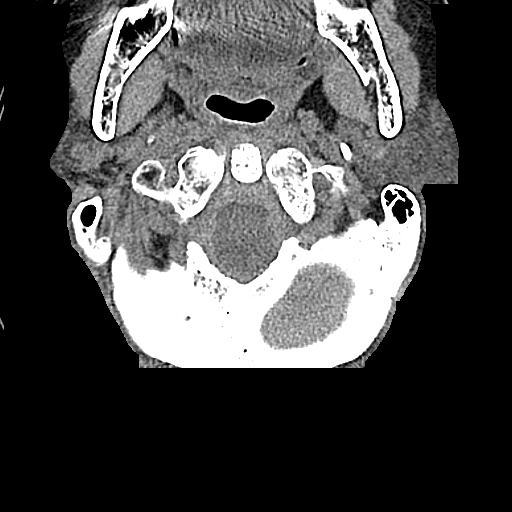
[im 71/85  bone]
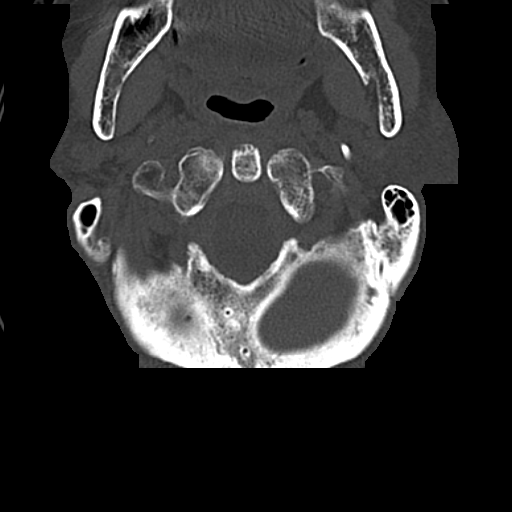

[15 of 33 positions shown; findings below may reference images not displayed]

FINDINGS: CT HEAD FINDINGS

Moderate atrophy and white matter disease is similar to the prior
study. No acute infarct, hemorrhage, or mass lesion is present. The
ventricles are proportionate to the degree of atrophy. No
significant extra-axial fluid collection is present.

No significant extracranial soft tissue injury is present. The
paranasal sinuses and mastoid air cells are clear. The calvarium is
intact. The globes and orbits are within normal limits.

CT CERVICAL SPINE FINDINGS

Cervical spine is imaged from skullbase through T1-2. Vertebral body
heights and alignment are maintained. Asymmetric facet degenerative
changes present on the right at C4-5. Uncovertebral spurring at C5-6
results in mild right foraminal narrowing bilaterally. Mild left
foraminal narrowing is present at C6-7 due to uncovertebral and
facet disease.

No acute fracture or traumatic subluxation is present. Scarring is
present at the lung apices bilaterally.
IMPRESSION: 1. Stable atrophy and white matter disease.
2. No acute intracranial abnormality or evidence for acute trauma.
3. Mild spondylosis of the cervical spine without evidence for acute
fracture or traumatic subluxation.

## 2017-07-29 DIAGNOSIS — N390 Urinary tract infection, site not specified: Secondary | ICD-10-CM | POA: Diagnosis not present

## 2017-07-29 DIAGNOSIS — R300 Dysuria: Secondary | ICD-10-CM | POA: Diagnosis not present

## 2017-08-10 DIAGNOSIS — I10 Essential (primary) hypertension: Secondary | ICD-10-CM | POA: Diagnosis not present

## 2017-09-25 ENCOUNTER — Ambulatory Visit (HOSPITAL_COMMUNITY)
Admission: EM | Admit: 2017-09-25 | Discharge: 2017-09-25 | Disposition: A | Discharge status: Home / Self Care | Payer: Medicare Other

## 2017-09-25 ENCOUNTER — Encounter (HOSPITAL_COMMUNITY): Payer: Self-pay | Admitting: Emergency Medicine

## 2017-09-25 DIAGNOSIS — N939 Abnormal uterine and vaginal bleeding, unspecified: Secondary | ICD-10-CM | POA: Diagnosis not present

## 2017-09-25 NOTE — ED Provider Notes (Signed)
[facility]    CSN: [id number] Arrival date & time: 09/25/17  1503     History   Chief Complaint Chief Complaint  Patient presents with  . Vaginal Bleeding    HPI Diana Hartman is a 76 y.o. female.   76 y.o. female presents with bleeding  X 3 days.Daugther at bedside states that there is "speckled blood" in the patient undergarments when she is being changed Daughter is uncertain if blood is urinary or vaginal in nature. Patient has dementia and is non verbal during assessment. Per daughter at bedside patient is baseline with no fevers and no pain that can be identified   Condition is acute in nature. Condition is made better by nothing. Condition is made worse by nothing. Patient denies any treatment prior to there arrival at this facility.        Past Medical History:  Diagnosis Date  . Dementia   . Diabetes mellitus   . Hypertension   . Temporal arteritis [facility](HCC)     Patient Active Problem List   Diagnosis Date Noted  . Protein-calorie malnutrition, severe 05/18/2016  . UTI (lower urinary tract infection) 05/17/2016  . Hyperglycemia 05/17/2016  . Hypernatremia 05/17/2016  . Dehydration 05/17/2016  . Acute encephalopathy 05/17/2016  . Acute cystitis without hematuria   . Hyperglycemia without ketosis   . Insulin dependent diabetes mellitus (HCC)   . Hypoglycemia 07/04/2015  . Diabetes mellitus, type II (HCC) 07/04/2015  . Essential hypertension 07/04/2015  . Dementia 07/04/2015  . Fall 07/04/2015    History reviewed. No pertinent surgical history.  OB History    No data available       Home Medications    Prior to Admission medications   Medication Sig Start Date End Date Taking? Authorizing Provider  acetaminophen (TYLENOL) 325 MG tablet Take 2 tablets (650 mg total) by mouth every 6 (six) hours as needed for mild pain (or Fever >/= 101). 05/19/16   Regalado, Belkys A, MD  cephALEXin (KEFLEX) 250 MG/5ML suspension Take 10 mLs (500 mg  total) by mouth every 12 (twelve) hours. 05/19/16   Regalado, Belkys A, MD  diltiazem (CARDIZEM) 90 MG tablet Take 1 tablet (90 mg total) by mouth every 8 (eight) hours. 05/19/16   Regalado, Belkys A, MD  donepezil (ARICEPT) 10 MG tablet Take 10 mg by mouth daily.    [provider]  GLUCERNA (GLUCERNA) LIQD Take 237 mLs by mouth 2 (two) times daily between meals. 05/19/16   Regalado, Belkys A, MD  insulin detemir (LEVEMIR) 100 UNIT/ML injection Inject 0.1 mLs (10 Units total) into the skin 2 (two) times daily. 07/06/15   Leatha GildingGherghe, Costin M, MD  Iron-Vitamins (GERITOL) LIQD Take 5 mLs by mouth daily.    [provider]  lisinopril (PRINIVIL,ZESTRIL) 40 MG tablet Take 40 mg by mouth daily.      [provider]  metFORMIN (GLUCOPHAGE) 1000 MG tablet Take 1,000 mg by mouth daily with breakfast.    [provider]  nebivolol (BYSTOLIC) 5 MG tablet Take 5 mg by mouth daily.    [provider]  predniSONE (DELTASONE) 1 MG tablet Take 1 mg by mouth daily.  06/30/15   [provider]  sertraline (ZOLOFT) 50 MG tablet Take 50 mg by mouth at bedtime.     [provider]  traMADol (ULTRAM) 50 MG tablet Take 1 tablet (50 mg total) by mouth every 6 (six) hours as needed. 12/29/15   Eber HongMiller, Brian, MD  zolpidem (  AMBIEN) 10 MG tablet Take 10 mg by mouth at bedtime.  05/31/15   [provider]    Family History Family History  Problem Relation Age of Onset  . Diabetes type II Other   . Hypertension Other     Social History Social History  Substance Use Topics  . Smoking status: Never Smoker  . Smokeless tobacco: Not on file  . Alcohol use No     Allergies   Patient has no known allergies.   Review of Systems Review of Systems  Constitutional: Negative for chills and fever.  HENT: Negative for ear pain and sore throat.   Eyes: Negative for pain and visual disturbance.  Respiratory: Negative for cough and shortness of breath.     Cardiovascular: Negative for chest pain and palpitations.  Gastrointestinal: Negative for abdominal pain and vomiting.  Genitourinary: Negative for dysuria and hematuria.  Musculoskeletal: Negative for arthralgias and back pain.  Skin: Negative for color change and rash.  Neurological: Negative for seizures and syncope.  All other systems reviewed and are negative.    Physical Exam Triage Vital Signs ED Triage Vitals [09/25/17 1554]  Enc Vitals Group     BP (!) 148/86     Pulse Rate (!) 55     Resp 20     Temp      Temp src      SpO2 97 %     Weight      Height      Head Circumference      Peak Flow      Pain Score      Pain Loc      Pain Edu?      Excl. in GC?    No data found.   Updated Vital Signs BP (!) 148/86 (BP Location: Left Arm)   Pulse (!) 55   Resp 20   SpO2 97%   Visual Acuity Right Eye Distance:   Left Eye Distance:   Bilateral Distance:    Right Eye Near:   Left Eye Near:    Bilateral Near:     Physical Exam  Constitutional: She is oriented to person, place, and time. She appears well-developed and well-nourished.  HENT:  Head: Normocephalic and atraumatic.  Eyes: Conjunctivae are normal.  Neck: Normal range of motion.  Pulmonary/Chest: Effort normal.  Musculoskeletal: Normal range of motion.  Neurological: She is alert and oriented to person, place, and time.  Skin: Skin is warm.  Psychiatric: She has a normal mood and affect.  Nursing note and vitals reviewed.    UC Treatments / Results  Labs (all labs ordered are listed, but only abnormal results are displayed) Labs Reviewed - No data to display  EKG  EKG Interpretation None       Radiology No results found.  Procedures Procedures (including critical care time)  Medications Ordered in UC Medications - No data to display   Initial Impression / Assessment and Plan / UC Course  I have reviewed the triage vital signs and the nursing notes.  Pertinent labs & imaging  results that were available during my care of the patient were reviewed by me and considered in my medical decision making (see chart for details).      Family declines urinary catheterization at this time. Per family patient typically will gather urine sample at home and bring sample in for testing.   Final Clinical Impressions(s) / UC Diagnoses   Final diagnoses:  None    New  Prescriptions New Prescriptions   No medications on file     Controlled Substance Prescriptions [place] Controlled Substance Registry consulted? Not Applicable   Alene Mires, NP 09/25/17 2059

## 2017-09-25 NOTE — Discharge Instructions (Addendum)
Recommend family attempt to place a tampon in patient's vagina to help identify source or bleeding. Please bring urine sample back to this facility or PMD for evaluation.

## 2017-09-25 NOTE — ED Triage Notes (Signed)
Family member reports noticing light blood 2-3 days ago and today looks heavier.  Patient does wear incontinent garments.  The blood has been visible on clothing

## 2017-09-25 NOTE — ED Notes (Signed)
Patient/caregiver discharged.  Caregiver to bring a urine back.  Patient wears diapers and has dementia.

## 2017-09-28 DIAGNOSIS — N390 Urinary tract infection, site not specified: Secondary | ICD-10-CM | POA: Diagnosis not present

## 2017-10-01 ENCOUNTER — Emergency Department (HOSPITAL_COMMUNITY): Payer: Medicare Other

## 2017-10-01 ENCOUNTER — Encounter (HOSPITAL_COMMUNITY): Payer: Self-pay

## 2017-10-01 ENCOUNTER — Inpatient Hospital Stay (HOSPITAL_COMMUNITY)
Admission: EM | Admit: 2017-10-01 | Discharge: 2017-10-04 | DRG: 641 | Disposition: A | Discharge status: Home Health | Payer: Medicare Other | Attending: Internal Medicine | Admitting: Internal Medicine

## 2017-10-01 DIAGNOSIS — Z79899 Other long term (current) drug therapy: Secondary | ICD-10-CM

## 2017-10-01 DIAGNOSIS — E861 Hypovolemia: Secondary | ICD-10-CM | POA: Diagnosis not present

## 2017-10-01 DIAGNOSIS — E870 Hyperosmolality and hypernatremia: Secondary | ICD-10-CM | POA: Diagnosis not present

## 2017-10-01 DIAGNOSIS — Z794 Long term (current) use of insulin: Secondary | ICD-10-CM

## 2017-10-01 DIAGNOSIS — I10 Essential (primary) hypertension: Secondary | ICD-10-CM | POA: Diagnosis not present

## 2017-10-01 DIAGNOSIS — W19XXXA Unspecified fall, initial encounter: Secondary | ICD-10-CM | POA: Diagnosis not present

## 2017-10-01 DIAGNOSIS — F0390 Unspecified dementia without behavioral disturbance: Secondary | ICD-10-CM | POA: Diagnosis present

## 2017-10-01 DIAGNOSIS — E860 Dehydration: Secondary | ICD-10-CM | POA: Diagnosis not present

## 2017-10-01 DIAGNOSIS — R509 Fever, unspecified: Secondary | ICD-10-CM | POA: Diagnosis not present

## 2017-10-01 DIAGNOSIS — E119 Type 2 diabetes mellitus without complications: Secondary | ICD-10-CM

## 2017-10-01 DIAGNOSIS — N179 Acute kidney failure, unspecified: Secondary | ICD-10-CM | POA: Diagnosis not present

## 2017-10-01 DIAGNOSIS — R402441 Other coma, without documented Glasgow coma scale score, or with partial score reported, in the field [EMT or ambulance]: Secondary | ICD-10-CM | POA: Diagnosis not present

## 2017-10-01 DIAGNOSIS — E118 Type 2 diabetes mellitus with unspecified complications: Secondary | ICD-10-CM | POA: Diagnosis not present

## 2017-10-01 DIAGNOSIS — N390 Urinary tract infection, site not specified: Secondary | ICD-10-CM | POA: Diagnosis not present

## 2017-10-01 LAB — BASIC METABOLIC PANEL
ANION GAP: 9 (ref 5–15)
ANION GAP: 10 (ref 5–15)
BUN: 29 mg/dL — ABNORMAL HIGH (ref 6–20)
BUN: 31 mg/dL — ABNORMAL HIGH (ref 6–20)
CALCIUM: 8.8 mg/dL — AB (ref 8.9–10.3)
CALCIUM: 8.3 mg/dL — AB (ref 8.9–10.3)
CO2: 31 mmol/L (ref 22–32)
CO2: 27 mmol/L (ref 22–32)
Chloride: 107 mmol/L (ref 101–111)
Chloride: 103 mmol/L (ref 101–111)
Creatinine, Ser: 0.96 mg/dL (ref 0.44–1.00)
Creatinine, Ser: 1.01 mg/dL — ABNORMAL HIGH (ref 0.44–1.00)
GFR calc Af Amer: >60 mL/min (ref >60)
GFR, EST NON AFRICAN AMERICAN: 56 mL/min — AB (ref >60)
GFR, EST NON AFRICAN AMERICAN: 52 mL/min — AB (ref >60)
Glucose, Bld: 432 mg/dL — ABNORMAL HIGH (ref 65–99)
Glucose, Bld: 421 mg/dL — ABNORMAL HIGH (ref 65–99)
Potassium: 3.5 mmol/L (ref 3.5–5.1)
Potassium: 3.5 mmol/L (ref 3.5–5.1)
Sodium: 147 mmol/L — ABNORMAL HIGH (ref 135–145)
Sodium: 140 mmol/L (ref 135–145)

## 2017-10-01 LAB — URINALYSIS, ROUTINE W REFLEX MICROSCOPIC
Bilirubin Urine: NEGATIVE
Ketones, ur: NEGATIVE mg/dL
NITRITE: NEGATIVE
Protein, ur: 100 mg/dL — AB
SPECIFIC GRAVITY, URINE: 1.022 (ref 1.005–1.030)
Squamous Epithelial / LPF: NONE SEEN
pH: 5 (ref 5.0–8.0)

## 2017-10-01 LAB — GLUCOSE, CAPILLARY
GLUCOSE-CAPILLARY: 503 mg/dL — AB (ref 65–99)
Glucose-Capillary: 424 mg/dL — ABNORMAL HIGH (ref 65–99)

## 2017-10-01 LAB — COMPREHENSIVE METABOLIC PANEL
ALBUMIN: 3.6 g/dL (ref 3.5–5.0)
ALT: 17 U/L (ref 14–54)
ANION GAP: 11 (ref 5–15)
AST: 21 U/L (ref 15–41)
Alkaline Phosphatase: 175 U/L — ABNORMAL HIGH (ref 38–126)
BILIRUBIN TOTAL: 0.9 mg/dL (ref 0.3–1.2)
BUN: 38 mg/dL — ABNORMAL HIGH (ref 6–20)
CO2: 29 mmol/L (ref 22–32)
Calcium: 9.7 mg/dL (ref 8.9–10.3)
Chloride: 112 mmol/L — ABNORMAL HIGH (ref 101–111)
Creatinine, Ser: 1.13 mg/dL — ABNORMAL HIGH (ref 0.44–1.00)
GFR calc Af Amer: 53 mL/min — ABNORMAL LOW (ref >60)
GFR, EST NON AFRICAN AMERICAN: 46 mL/min — AB (ref >60)
GLUCOSE: 393 mg/dL — AB (ref 65–99)
POTASSIUM: 3.7 mmol/L (ref 3.5–5.1)
Sodium: 152 mmol/L — ABNORMAL HIGH (ref 135–145)
TOTAL PROTEIN: 10.1 g/dL — AB (ref 6.5–8.1)

## 2017-10-01 LAB — CBC WITH DIFFERENTIAL/PLATELET
BASOS ABS: 0 10*3/uL (ref 0.0–0.1)
Basophils Relative: 0 %
Eosinophils Absolute: 0 10*3/uL (ref 0.0–0.7)
Eosinophils Relative: 0 %
HEMATOCRIT: 40.1 % (ref 36.0–46.0)
Hemoglobin: 13.5 g/dL (ref 12.0–15.0)
LYMPHS PCT: 22 %
Lymphs Abs: 2.5 10*3/uL (ref 0.7–4.0)
MCH: 31.1 pg (ref 26.0–34.0)
MCHC: 33.7 g/dL (ref 30.0–36.0)
MCV: 92.4 fL (ref 78.0–100.0)
Monocytes Absolute: 1.6 10*3/uL — ABNORMAL HIGH (ref 0.1–1.0)
Monocytes Relative: 14 %
NEUTROS ABS: 7.2 10*3/uL (ref 1.7–7.7)
Neutrophils Relative %: 64 %
PLATELETS: 165 10*3/uL (ref 150–400)
RBC: 4.34 MIL/uL (ref 3.87–5.11)
RDW: 14.3 % (ref 11.5–15.5)
WBC: 11.2 10*3/uL — AB (ref 4.0–10.5)

## 2017-10-01 LAB — I-STAT CG4 LACTIC ACID, ED
LACTIC ACID, VENOUS: 1.67 mmol/L (ref 0.5–1.9)
LACTIC ACID, VENOUS: 1.6 mmol/L (ref 0.5–1.9)

## 2017-10-01 LAB — CBG MONITORING, ED: Glucose-Capillary: 369 mg/dL — ABNORMAL HIGH (ref 65–99)

## 2017-10-01 MED ORDER — LISINOPRIL 20 MG PO TABS
40.0000 mg | ORAL_TABLET | Freq: Every day | ORAL | Status: DC
  Administered 2017-10-01 – 2017-10-04 (×4): 40 mg via ORAL
  Filled 2017-10-01 (×4): qty 2

## 2017-10-01 MED ORDER — INSULIN DETEMIR 100 UNIT/ML ~~LOC~~ SOLN
15.0000 [IU] | Freq: Once | SUBCUTANEOUS | Status: AC
  Administered 2017-10-01: 15 [IU] via SUBCUTANEOUS
  Filled 2017-10-01: qty 0.15

## 2017-10-01 MED ORDER — INSULIN DETEMIR 100 UNIT/ML ~~LOC~~ SOLN
10.0000 [IU] | Freq: Two times a day (BID) | SUBCUTANEOUS | Status: DC
  Filled 2017-10-01: qty 0.1

## 2017-10-01 MED ORDER — ACETAMINOPHEN 650 MG RE SUPP: 650.0000 mg | Freq: Four times a day (QID) | RECTAL | Status: DC | PRN

## 2017-10-01 MED ORDER — ONDANSETRON HCL 4 MG/2ML IJ SOLN: 4.0000 mg | Freq: Four times a day (QID) | INTRAMUSCULAR | Status: DC | PRN

## 2017-10-01 MED ORDER — DONEPEZIL HCL 10 MG PO TABS
10.0000 mg | ORAL_TABLET | Freq: Every day | ORAL | Status: DC
  Administered 2017-10-01 – 2017-10-04 (×4): 10 mg via ORAL
  Filled 2017-10-01 (×3): qty 2
  Filled 2017-10-01: qty 1
  Filled 2017-10-01: qty 2

## 2017-10-01 MED ORDER — DEXTROSE 5 % IV SOLN
INTRAVENOUS | Status: DC
  Administered 2017-10-01: 18:00:00 via INTRAVENOUS

## 2017-10-01 MED ORDER — INSULIN ASPART 100 UNIT/ML ~~LOC~~ SOLN
0.0000 [IU] | Freq: Three times a day (TID) | SUBCUTANEOUS | Status: DC
  Administered 2017-10-01: 9 [IU] via SUBCUTANEOUS
  Administered 2017-10-02: 2 [IU] via SUBCUTANEOUS
  Administered 2017-10-02: 7 [IU] via SUBCUTANEOUS
  Administered 2017-10-02: 3 [IU] via SUBCUTANEOUS
  Administered 2017-10-03: 2 [IU] via SUBCUTANEOUS
  Administered 2017-10-03: 5 [IU] via SUBCUTANEOUS
  Administered 2017-10-03: 7 [IU] via SUBCUTANEOUS
  Administered 2017-10-04: 2 [IU] via SUBCUTANEOUS
  Administered 2017-10-04 (×2): 7 [IU] via SUBCUTANEOUS

## 2017-10-01 MED ORDER — ACETAMINOPHEN 325 MG PO TABS
650.0000 mg | ORAL_TABLET | Freq: Four times a day (QID) | ORAL | Status: DC | PRN
Start: 2017-10-01 — End: 2017-10-04

## 2017-10-01 MED ORDER — SERTRALINE HCL 50 MG PO TABS
50.0000 mg | ORAL_TABLET | Freq: Every day | ORAL | Status: DC
  Administered 2017-10-01 – 2017-10-03 (×3): 50 mg via ORAL
  Filled 2017-10-01 (×3): qty 1

## 2017-10-01 MED ORDER — INSULIN ASPART 100 UNIT/ML IV SOLN: 10.0000 [IU] | Freq: Once | INTRAVENOUS | Status: DC

## 2017-10-01 MED ORDER — LIP MEDEX EX OINT
TOPICAL_OINTMENT | CUTANEOUS | Status: AC
  Administered 2017-10-01: 1
  Filled 2017-10-01: qty 7

## 2017-10-01 MED ORDER — INSULIN ASPART 100 UNIT/ML IV SOLN
10.0000 [IU] | Freq: Once | INTRAVENOUS | Status: AC
  Administered 2017-10-01: 10 [IU] via SUBCUTANEOUS

## 2017-10-01 MED ORDER — SODIUM CHLORIDE 0.9 % IV BOLUS (SEPSIS)
1000.0000 mL | Freq: Once | INTRAVENOUS | Status: AC
  Administered 2017-10-01: 1000 mL via INTRAVENOUS

## 2017-10-01 MED ORDER — CEFTRIAXONE SODIUM 2 G IJ SOLR
2.0000 g | Freq: Once | INTRAMUSCULAR | Status: AC
  Administered 2017-10-01: 2 g via INTRAVENOUS
  Filled 2017-10-01: qty 2

## 2017-10-01 MED ORDER — DILTIAZEM HCL 60 MG PO TABS
120.0000 mg | ORAL_TABLET | Freq: Four times a day (QID) | ORAL | Status: DC
  Administered 2017-10-01 – 2017-10-04 (×12): 120 mg via ORAL
  Filled 2017-10-01 (×10): qty 2
  Filled 2017-10-01: qty 1
  Filled 2017-10-01 (×4): qty 2

## 2017-10-01 MED ORDER — HYDRALAZINE HCL 20 MG/ML IJ SOLN
10.0000 mg | Freq: Three times a day (TID) | INTRAMUSCULAR | Status: DC | PRN
  Filled 2017-10-01: qty 1

## 2017-10-01 MED ORDER — ONDANSETRON HCL 4 MG PO TABS: 4.0000 mg | ORAL_TABLET | Freq: Four times a day (QID) | ORAL | Status: DC | PRN

## 2017-10-01 MED ORDER — ENOXAPARIN SODIUM 40 MG/0.4ML ~~LOC~~ SOLN
40.0000 mg | SUBCUTANEOUS | Status: DC
  Administered 2017-10-01 – 2017-10-04 (×4): 40 mg via SUBCUTANEOUS
  Filled 2017-10-01 (×4): qty 0.4

## 2017-10-01 MED ORDER — DEXTROSE 5 % IV SOLN
2.0000 g | INTRAVENOUS | Status: DC
  Administered 2017-10-02: 2 g via INTRAVENOUS
  Filled 2017-10-01 (×2): qty 2

## 2017-10-01 NOTE — ED Provider Notes (Signed)
[facility]-EMERGENCY DEPT Provider Note   CSN: [id number] Arrival date & time: 10/01/17  1049     History   Chief Complaint Chief Complaint  Patient presents with  . Weakness    HPI Diana Hartman is a 76 y.o. female.  Level V caveat: Dementia.   Diana Hartman is a 76 y.o. Female who presents to the ED from home with her son and daughter who report the patient has been more sleepy and not herself since yesterday. She has a history of dementia and normally is talkative and more awake. Since yesterday she has been more sleepy and not talkative.  The patient told him today that she might need to go to the hospital.  They report they were concerned for UTI last week and took her to urgent care.  She was unable to get a urine sample at that time.  They report the urine sample they obtained later had some blood in it.  No known fevers or rashes. She has been eating and drinking less.  She has had no vomiting or diarrhea.  No decreased urination. No coughing or trouble breathing. No focal weakness noted.    The history is provided by medical records, a relative and a caregiver. No language interpreter was used.  Weakness     Past Medical History:  Diagnosis Date  . Dementia   . Diabetes mellitus   . Hypertension   . Temporal arteritis [facility])     Patient Active Problem List   Diagnosis Date Noted  . Protein-calorie malnutrition, severe 05/18/2016  . UTI (lower urinary tract infection) 05/17/2016  . Hyperglycemia 05/17/2016  . Hypernatremia 05/17/2016  . Dehydration 05/17/2016  . Acute encephalopathy 05/17/2016  . Acute cystitis without hematuria   . Hyperglycemia without ketosis   . Insulin dependent diabetes mellitus (HCC)   . Hypoglycemia 07/04/2015  . Diabetes mellitus, type II (HCC) 07/04/2015  . Essential hypertension 07/04/2015  . Dementia 07/04/2015  . Fall 07/04/2015    Past Surgical History:  Procedure Laterality Date  . NO PAST  SURGERIES      OB History    No data available       Home Medications    Prior to Admission medications   Medication Sig Start Date End Date Taking? Authorizing Provider  acetaminophen (TYLENOL) 325 MG tablet Take 2 tablets (650 mg total) by mouth every 6 (six) hours as needed for mild pain (or Fever >/= 101). 05/19/16  Yes Regalado, Belkys A, MD  diltiazem (CARDIZEM) 120 MG tablet Take 120 mg by mouth 4 (four) times daily.   Yes [provider]  donepezil (ARICEPT) 10 MG tablet Take 10 mg by mouth daily.   Yes [provider]  insulin detemir (LEVEMIR) 100 UNIT/ML injection Inject 0.1 mLs (10 Units total) into the skin 2 (two) times daily. 07/06/15  Yes Gherghe, Daylene Katayama, MD  Iron-Vitamins (GERITOL) LIQD Take 5 mLs by mouth daily.   Yes [provider]  lisinopril (PRINIVIL,ZESTRIL) 40 MG tablet Take 40 mg by mouth daily.     Yes [provider]  metFORMIN (GLUCOPHAGE) 1000 MG tablet Take 1,000 mg by mouth daily with breakfast.   Yes [provider]  nebivolol (BYSTOLIC) 5 MG tablet Take 5 mg by mouth daily.   Yes [provider]  sertraline (ZOLOFT) 50 MG tablet Take 50 mg by mouth at bedtime.    Yes [provider]  traMADol (ULTRAM) 50 MG tablet Take 1 tablet (  50 mg total) by mouth every 6 (six) hours as needed. 12/29/15  Yes Eber HongMiller, Brian, MD  zolpidem (AMBIEN) 10 MG tablet Take 10 mg by mouth at bedtime.  05/31/15  Yes [provider]  cephALEXin (KEFLEX) 250 MG/5ML suspension Take 10 mLs (500 mg total) by mouth every 12 (twelve) hours. Patient not taking: Reported on 10/01/2017 05/19/16   Regalado, Jon BillingsBelkys A, MD  diltiazem (CARDIZEM) 90 MG tablet Take 1 tablet (90 mg total) by mouth every 8 (eight) hours. Patient not taking: Reported on 10/01/2017 05/19/16   Regalado, Jon BillingsBelkys A, MD  GLUCERNA (GLUCERNA) LIQD Take 237 mLs by mouth 2 (two) times daily between meals. Patient not taking: Reported on 10/01/2017 05/19/16    Alba Coryegalado, Belkys A, MD    Family History Family History  Problem Relation Age of Onset  . Diabetes type II Other   . Hypertension Other     Social History Social History  Substance Use Topics  . Smoking status: Never Smoker  . Smokeless tobacco: Never Used  . Alcohol use No     Allergies   Patient has no known allergies.   Review of Systems Review of Systems  Unable to perform ROS: Dementia  Neurological: Positive for weakness.     Physical Exam Updated Vital Signs BP (!) 173/90   Pulse 83   Temp 98.9 F (37.2 C) (Rectal)   Resp 18   SpO2 94%   Physical Exam  Constitutional: She appears well-developed and well-nourished. No distress.  Warm to touch.  Awakens with verbal stimuli.  Nontoxic-appearing.  HENT:  Head: Normocephalic and atraumatic.  Mouth/Throat: Oropharynx is clear and moist.  Eyes: Pupils are equal, round, and reactive to light. Conjunctivae are normal. Right eye exhibits no discharge. Left eye exhibits no discharge.  Neck: Neck supple.  Cardiovascular: Normal rate, regular rhythm, normal heart sounds and intact distal pulses.  Exam reveals no gallop and no friction rub.   No murmur heard. Pulmonary/Chest: Effort normal and breath sounds normal. No respiratory distress. She has no wheezes. She has no rales.  Lungs are clear to ascultation bilaterally. Symmetric chest expansion bilaterally. No increased work of breathing. No rales or rhonchi.    Abdominal: Soft. There is no tenderness. There is no guarding.  Musculoskeletal: She exhibits no edema.  No LE edema.   Lymphadenopathy:    She has no cervical adenopathy.  Neurological: She is alert. Coordination normal.  Patient is spontaneously moving all extremities in a coordinated fashion exhibiting good strength.  Awakens to verbal stimuli. Does not respond verbally. Does not cooperate with history.   Skin: Skin is warm and dry. Capillary refill takes less than 2 seconds. No rash noted. She is not  diaphoretic. No erythema. No pallor.  Psychiatric: She has a normal mood and affect. Her behavior is normal.  Nursing note and vitals reviewed.    ED Treatments / Results  Labs (all labs ordered are listed, but only abnormal results are displayed) Labs Reviewed  COMPREHENSIVE METABOLIC PANEL - Abnormal; Notable for the following:       Result Value   Sodium 152 (*)    Chloride 112 (*)    Glucose, Bld 393 (*)    BUN 38 (*)    Creatinine, Ser 1.13 (*)    Total Protein 10.1 (*)    Alkaline Phosphatase 175 (*)    GFR calc non Af Amer 46 (*)    GFR calc Af Amer 53 (*)    All other components within  normal limits  CBC WITH DIFFERENTIAL/PLATELET - Abnormal; Notable for the following:    WBC 11.2 (*)    Monocytes Absolute 1.6 (*)    All other components within normal limits  URINALYSIS, ROUTINE W REFLEX MICROSCOPIC - Abnormal; Notable for the following:    APPearance TURBID (*)    Glucose, UA >=500 (*)    Hgb urine dipstick LARGE (*)    Protein, ur 100 (*)    Leukocytes, UA LARGE (*)    Bacteria, UA MANY (*)    All other components within normal limits  CBG MONITORING, ED - Abnormal; Notable for the following:    Glucose-Capillary 369 (*)    All other components within normal limits  URINE CULTURE  CBC  CREATININE, SERUM  I-STAT CG4 LACTIC ACID, ED  I-STAT CG4 LACTIC ACID, ED    EKG  EKG Interpretation None       Radiology Dg Chest 2 View  Result Date: 10/01/2017 CLINICAL DATA:  Fever, altered EXAM: CHEST  2 VIEW COMPARISON:  07/04/2015 FINDINGS: Mild cardiomegaly. Aortic atherosclerosis. No consolidation or pleural effusion. No pneumothorax. Mild wedging of mid to lower thoracic vertebra. IMPRESSION: 1. No focal pulmonary infiltrate 2. Mild cardiomegaly Electronically Signed   By: Jasmine Pang M.D.   On: 10/01/2017 15:00    Procedures Procedures (including critical care time)  CRITICAL CARE Performed by: Lawana Chambers   Total critical care time: 40  minutes  Critical care time was exclusive of separately billable procedures and treating other patients.  Critical care was necessary to treat or prevent imminent or life-threatening deterioration.  Critical care was time spent personally by me on the following activities: development of treatment plan with patient and/or surrogate as well as nursing, discussions with consultants, evaluation of patient's response to treatment, examination of patient, obtaining history from patient or surrogate, ordering and performing treatments and interventions, ordering and review of laboratory studies, ordering and review of radiographic studies, pulse oximetry and re-evaluation of patient's condition.   Medications Ordered in ED Medications  insulin aspart (novoLOG) injection 0-9 Units (not administered)  diltiazem (CARDIZEM) tablet 120 mg (not administered)  insulin detemir (LEVEMIR) injection 10 Units (not administered)  donepezil (ARICEPT) tablet 10 mg (not administered)  lisinopril (PRINIVIL,ZESTRIL) tablet 40 mg (not administered)  sertraline (ZOLOFT) tablet 50 mg (not administered)  enoxaparin (LOVENOX) injection 40 mg (not administered)  acetaminophen (TYLENOL) tablet 650 mg (not administered)    Or  acetaminophen (TYLENOL) suppository 650 mg (not administered)  ondansetron (ZOFRAN) tablet 4 mg (not administered)    Or  ondansetron (ZOFRAN) injection 4 mg (not administered)  hydrALAZINE (APRESOLINE) injection 10 mg (not administered)  dextrose 5 % solution (not administered)  sodium chloride 0.9 % bolus 1,000 mL (1,000 mLs Intravenous Transfusing/Transfer 10/01/17 1604)  cefTRIAXone (ROCEPHIN) 2 g in dextrose 5 % 50 mL IVPB (0 g Intravenous Stopped 10/01/17 1559)     Initial Impression / Assessment and Plan / ED Course  I have reviewed the triage vital signs and the nursing notes.  Pertinent labs & imaging results that were available during my care of the patient were reviewed by me and  considered in my medical decision making (see chart for details).     This  is a 76 y.o. Female who presents to the ED from home with her son and daughter who report the patient has been more sleepy and not herself since yesterday. She has a history of dementia and normally is talkative and more awake. Since yesterday  she has been more sleepy and not talkative.  The patient told him today that she might need to go to the hospital.  They report they were concerned for UTI last week and took her to urgent care.  She was unable to get a urine sample at that time.  They report the urine sample they obtained later had some blood in it.  No known fevers or rashes. She has been eating and drinking less.  She has had no vomiting or diarrhea.  On exam the patient is afebrile nontoxic-appearing.  Her abdomen is soft and nontender.  Lungs are clear to auscultation bilaterally.  She does not cooperate with history.  Urinalysis reveals a urinary tract infection with large leukocytes and too numerous to count white blood cells.  Urine culture sent.  See NP is remarkable for hypernatremia with a sodium of 152. Glucose is 393.  BUN is 38 with a creatinine of 1.13.  Will start fluid bolus.  Lactic acid is within normal limits. At reevaluation patient is more awake and alert.  She is still demented, but does speak with me.  I spoke with family about results and they agree with plan for admission.  Patient started with fluids and Rocephin for urinary tract infection.  Dr. Melynda Ripple accepted the patient for admission.  This patient was discussed with and evaluated by Dr. Preston Fleeting who agrees with assessment and plan.    Final Clinical Impressions(s) / ED Diagnoses   Final diagnoses:  Hypernatremia  Dehydration  Lower urinary tract infectious disease    New Prescriptions New Prescriptions   No medications on file     Everlene Farrier, Cordelia Poche 10/01/17 1738    Dione Booze, MD 10/02/17 [phone]

## 2017-10-01 NOTE — ED Triage Notes (Signed)
This pt., whom her son identified as a "severe dementia patient" was found by her family (who are her home caregivers) to be less verbal than usual. They also state that pt. Is at best, normally minimally verbal. She arrives here in no distress.

## 2017-10-01 NOTE — H&P (Signed)
[facility] History and Physical  Jamelle Haringda B Portilla ZHY:[id number] DOB: 1941/05/06 DOA: 10/01/2017  Referring physician:  PCP: Darci NeedleKohut, Walter, MD   Chief Complaint: "She does seem to be talking less."-Daughter  HPI: Jamelle Haringda B Wedge is a 76 y.o. female past medical history of dementia, diabetes, hypertension, temporal arteritis and selective mutism presents the emergency room with decreased activity and talking.  Daughter states that the patient in the past has had episodes where she is dehydrated or has urinary tract infection and she becomes less active.  Brought her here because of that concern.  States patient has had no urinary symptoms.  No change in appetite.  States patient has been afebrile.   Review of Systems:  As per HPI otherwise 10 point review of systems negative.    Past Medical History:  Diagnosis Date  . Dementia   . Diabetes mellitus   . Hypertension   . Temporal arteritis (HCC)    No past surgical history on file. Social History:  reports that she has never smoked. She has never used smokeless tobacco. She reports that she does not drink alcohol or use drugs.  No Known Allergies  Family History  Problem Relation Age of Onset  . Diabetes type II Other   . Hypertension Other      Prior to Admission medications   Medication Sig Start Date End Date Taking? Authorizing Provider  acetaminophen (TYLENOL) 325 MG tablet Take 2 tablets (650 mg total) by mouth every 6 (six) hours as needed for mild pain (or Fever >/= 101). 05/19/16  Yes Regalado, Belkys A, MD  diltiazem (CARDIZEM) 120 MG tablet Take 120 mg by mouth 4 (four) times daily.   Yes [provider]  donepezil (ARICEPT) 10 MG tablet Take 10 mg by mouth daily.   Yes [provider]  insulin detemir (LEVEMIR) 100 UNIT/ML injection Inject 0.1 mLs (10 Units total) into the skin 2 (two) times daily. 07/06/15  Yes Gherghe, Daylene Katayamaostin M, MD  Iron-Vitamins (GERITOL) LIQD Take 5 mLs by mouth daily.    Yes [provider]  lisinopril (PRINIVIL,ZESTRIL) 40 MG tablet Take 40 mg by mouth daily.     Yes [provider]  metFORMIN (GLUCOPHAGE) 1000 MG tablet Take 1,000 mg by mouth daily with breakfast.   Yes [provider]  nebivolol (BYSTOLIC) 5 MG tablet Take 5 mg by mouth daily.   Yes [provider]  sertraline (ZOLOFT) 50 MG tablet Take 50 mg by mouth at bedtime.    Yes [provider]  traMADol (ULTRAM) 50 MG tablet Take 1 tablet (50 mg total) by mouth every 6 (six) hours as needed. 12/29/15  Yes Eber HongMiller, Brian, MD  zolpidem (AMBIEN) 10 MG tablet Take 10 mg by mouth at bedtime.  05/31/15  Yes [provider]  cephALEXin (KEFLEX) 250 MG/5ML suspension Take 10 mLs (500 mg total) by mouth every 12 (twelve) hours. Patient not taking: Reported on 10/01/2017 05/19/16   Regalado, Jon BillingsBelkys A, MD  diltiazem (CARDIZEM) 90 MG tablet Take 1 tablet (90 mg total) by mouth every 8 (eight) hours. Patient not taking: Reported on 10/01/2017 05/19/16   Regalado, Jon BillingsBelkys A, MD  GLUCERNA (GLUCERNA) LIQD Take 237 mLs by mouth 2 (two) times daily between meals. Patient not taking: Reported on 10/01/2017 05/19/16   Alba Coryegalado, Belkys A, MD   Physical Exam: Vitals:   10/01/17 1114 10/01/17 1340  BP: (!) 156/90 (!) 180/88  Pulse: 91 85  Resp: 18 18  Temp: 99.7 F (37.6  C) 98.9 F (37.2 C)  TempSrc: Rectal Rectal  SpO2: 96% 97%    Wt Readings from Last 3 Encounters:  05/18/16 45.4 kg (100 lb)  12/03/11 63.5 kg (140 lb)    General:  Appears calm and comfortable; A&Ox3 Eyes:  PERRL, EOMI, normal lids, iris ENT:  grossly normal hearing, lips & tongue Neck:  no LAD, masses or thyromegaly Cardiovascular:  RRR, no m/r/g. No LE edema.  Respiratory:  CTA bilaterally, no w/r/r. Normal respiratory effort. Abdomen:  soft, ntnd Skin:  no rash or induration seen on limited exam Musculoskeletal:  grossly normal tone BUE/BLE Psychiatric:  grossly normal mood and affect,  speech fluent and appropriate -only speaks to daughter Neurologic:  CN 2-12 grossly intact, moves all extremities in coordinated fashion.          Labs on Admission:  Basic Metabolic Panel:  Recent Labs Lab 10/01/17 1300  NA 152*  K 3.7  CL 112*  CO2 29  GLUCOSE 393*  BUN 38*  CREATININE 1.13*  CALCIUM 9.7   Liver Function Tests:  Recent Labs Lab 10/01/17 1300  AST 21  ALT 17  ALKPHOS 175*  BILITOT 0.9  PROT 10.1*  ALBUMIN 3.6   No results for input(s): LIPASE, AMYLASE in the last 168 hours. No results for input(s): AMMONIA in the last 168 hours. CBC:  Recent Labs Lab 10/01/17 1300  WBC 11.2*  NEUTROABS 7.2  HGB 13.5  HCT 40.1  MCV 92.4  PLT 165   Cardiac Enzymes: No results for input(s): CKTOTAL, CKMB, CKMBINDEX, TROPONINI in the last 168 hours.  BNP (last 3 results) No results for input(s): BNP in the last 8760 hours.  ProBNP (last 3 results) No results for input(s): PROBNP in the last 8760 hours.   Creatinine clearance cannot be calculated (Unknown ideal weight.)  CBG:  Recent Labs Lab 10/01/17 1244  GLUCAP 369*    Radiological Exams on Admission: No results found.  EKG: no new  Assessment/Plan Active Problems:   Hypernatremia  Hypernatremia D5 drip Patient is getting a one-time increased dose of levemir.  This may need to be repeated. Will trend  DM SSI AC Cont home levemir  UTI Cont rocephin Ucx sent  Hypertension When necessary hydralazine 10 mg IV as needed for severe blood pressure Cont lisinopril   Code Status: FC  DVT Prophylaxis: Lovenox Family Communication: dgtr at bedside Disposition Plan: Pending Improvement  Status: tele obs  Haydee Salter, MD Family Medicine [facility] [email or web address] Password [email or web address]

## 2017-10-01 NOTE — ED Notes (Signed)
I have just given report to [name], RN on [address]--will transport shortly.

## 2017-10-02 DIAGNOSIS — E870 Hyperosmolality and hypernatremia: Principal | ICD-10-CM

## 2017-10-02 DIAGNOSIS — F0390 Unspecified dementia without behavioral disturbance: Secondary | ICD-10-CM

## 2017-10-02 DIAGNOSIS — E118 Type 2 diabetes mellitus with unspecified complications: Secondary | ICD-10-CM

## 2017-10-02 DIAGNOSIS — N390 Urinary tract infection, site not specified: Secondary | ICD-10-CM

## 2017-10-02 LAB — BASIC METABOLIC PANEL
ANION GAP: 9 (ref 5–15)
Anion gap: 8 (ref 5–15)
Anion gap: 5 (ref 5–15)
BUN: 32 mg/dL — ABNORMAL HIGH (ref 6–20)
BUN: 30 mg/dL — ABNORMAL HIGH (ref 6–20)
BUN: 34 mg/dL — ABNORMAL HIGH (ref 6–20)
CHLORIDE: 105 mmol/L (ref 101–111)
CHLORIDE: 100 mmol/L — AB (ref 101–111)
CHLORIDE: 108 mmol/L (ref 101–111)
CO2: 27 mmol/L (ref 22–32)
CO2: 29 mmol/L (ref 22–32)
CO2: 26 mmol/L (ref 22–32)
CREATININE: 1.05 mg/dL — AB (ref 0.44–1.00)
Calcium: 8.4 mg/dL — ABNORMAL LOW (ref 8.9–10.3)
Calcium: 8.3 mg/dL — ABNORMAL LOW (ref 8.9–10.3)
Calcium: 8.3 mg/dL — ABNORMAL LOW (ref 8.9–10.3)
Creatinine, Ser: 0.98 mg/dL (ref 0.44–1.00)
Creatinine, Ser: 1.21 mg/dL — ABNORMAL HIGH (ref 0.44–1.00)
GFR calc Af Amer: >60 mL/min (ref >60)
GFR calc non Af Amer: 50 mL/min — ABNORMAL LOW (ref >60)
GFR calc non Af Amer: 54 mL/min — ABNORMAL LOW (ref >60)
GFR calc non Af Amer: 42 mL/min — ABNORMAL LOW (ref >60)
GFR, EST AFRICAN AMERICAN: 58 mL/min — AB (ref >60)
GFR, EST AFRICAN AMERICAN: 49 mL/min — AB (ref >60)
GLUCOSE: 222 mg/dL — AB (ref 65–99)
Glucose, Bld: 325 mg/dL — ABNORMAL HIGH (ref 65–99)
Glucose, Bld: 213 mg/dL — ABNORMAL HIGH (ref 65–99)
POTASSIUM: 3.5 mmol/L (ref 3.5–5.1)
POTASSIUM: 4.1 mmol/L (ref 3.5–5.1)
Potassium: 3.2 mmol/L — ABNORMAL LOW (ref 3.5–5.1)
SODIUM: 137 mmol/L (ref 135–145)
SODIUM: 139 mmol/L (ref 135–145)
Sodium: 141 mmol/L (ref 135–145)

## 2017-10-02 LAB — CBC
HCT: 34.6 % — ABNORMAL LOW (ref 36.0–46.0)
Hemoglobin: 11.2 g/dL — ABNORMAL LOW (ref 12.0–15.0)
MCH: 30 pg (ref 26.0–34.0)
MCHC: 32.4 g/dL (ref 30.0–36.0)
MCV: 92.8 fL (ref 78.0–100.0)
Platelets: 142 10*3/uL — ABNORMAL LOW (ref 150–400)
RBC: 3.73 MIL/uL — AB (ref 3.87–5.11)
RDW: 14.3 % (ref 11.5–15.5)
WBC: 14.2 10*3/uL — ABNORMAL HIGH (ref 4.0–10.5)

## 2017-10-02 LAB — GLUCOSE, CAPILLARY
GLUCOSE-CAPILLARY: 320 mg/dL — AB (ref 65–99)
GLUCOSE-CAPILLARY: 206 mg/dL — AB (ref 65–99)
Glucose-Capillary: 193 mg/dL — ABNORMAL HIGH (ref 65–99)
Glucose-Capillary: 224 mg/dL — ABNORMAL HIGH (ref 65–99)

## 2017-10-02 MED ORDER — INSULIN DETEMIR 100 UNIT/ML ~~LOC~~ SOLN
5.0000 [IU] | Freq: Two times a day (BID) | SUBCUTANEOUS | Status: DC
Start: 2017-10-02 — End: 2017-10-04
  Administered 2017-10-02 – 2017-10-04 (×5): 5 [IU] via SUBCUTANEOUS
  Filled 2017-10-02 (×6): qty 0.05

## 2017-10-02 MED ORDER — SODIUM CHLORIDE 0.9 % IV SOLN
INTRAVENOUS | Status: AC
  Administered 2017-10-02 (×2): via INTRAVENOUS

## 2017-10-02 MED ORDER — NEBIVOLOL HCL 5 MG PO TABS
5.0000 mg | ORAL_TABLET | Freq: Every day | ORAL | Status: DC
  Administered 2017-10-02 – 2017-10-04 (×3): 5 mg via ORAL
  Filled 2017-10-02 (×3): qty 1

## 2017-10-02 MED ORDER — DILTIAZEM HCL 90 MG PO TABS: 90.0000 mg | ORAL_TABLET | Freq: Three times a day (TID) | ORAL | Status: DC

## 2017-10-02 MED ORDER — POTASSIUM CHLORIDE CRYS ER 20 MEQ PO TBCR
40.0000 meq | EXTENDED_RELEASE_TABLET | Freq: Two times a day (BID) | ORAL | Status: AC
  Administered 2017-10-02 (×2): 40 meq via ORAL
  Filled 2017-10-02 (×2): qty 2

## 2017-10-02 MED ORDER — TRAMADOL HCL 50 MG PO TABS
50.0000 mg | ORAL_TABLET | Freq: Four times a day (QID) | ORAL | Status: DC | PRN
  Administered 2017-10-03: 50 mg via ORAL
  Filled 2017-10-02: qty 1

## 2017-10-02 MED ORDER — ZOLPIDEM TARTRATE 5 MG PO TABS
5.0000 mg | ORAL_TABLET | Freq: Every day | ORAL | Status: DC
  Administered 2017-10-02 – 2017-10-03 (×2): 5 mg via ORAL
  Filled 2017-10-02 (×2): qty 1

## 2017-10-02 NOTE — Progress Notes (Addendum)
[facility] PROGRESS NOTE    Progress Note  Diana Hartman  ZOX:[id number] DOB: 26-Sep-1941 DOA: 10/01/2017 PCP: Darci NeedleKohut, Walter, MD     Brief Narrative:   Diana Hartman is an 76 y.o. female past medical history of dementia diabetes presents to the emergency room due to decreased activity.  Assessment/Plan:   Hypernatremia On admission her sodium 147 she was started on D5, this morning is 141. We'll change to half-normal saline.  Acute kidney injury: Likely prerenal in etiology was started on half normal saline and recheck a basic metabolic panel in the morning.  Diabetes mellitus, type II (HCC): Continue home dose of Levemir plus sliding scale insulin her blood glucose has improved.  Probable urinary tract infection: She was started empirically on IV Rocephin culture status pending.  Dementia Without aggressive behavior Haldol PRN   DVT prophylaxis: lovenox Family Communication:none Disposition Plan/Barrier to D/C: home in 2-3 days Code Status:     Code Status Orders        Start     Ordered   10/01/17 1504  Full code  Continuous     10/01/17 1505    Code Status History    Date Active Date Inactive Code Status Order ID Comments User Context   05/17/2016  3:41 AM 05/19/2016  6:32 PM Full Code [id number]  Briscoe Deutscherpyd, Timothy S, MD Inpatient   07/04/2015  5:18 PM 07/06/2015  3:12 PM Full Code [id number]  Edsel PetrinMikhail, Maryann, DO Inpatient    Advance Directive Documentation     Most Recent Value  Type of Advance Directive  Healthcare Power of Attorney  Pre-existing out of facility DNR order (yellow form or pink MOST form)  -  "MOST" Form in Place?  -        IV Access:    Peripheral IV   Procedures and diagnostic studies:   Dg Chest 2 View  Result Date: 10/01/2017 CLINICAL DATA:  Fever, altered EXAM: CHEST  2 VIEW COMPARISON:  07/04/2015 FINDINGS: Mild cardiomegaly. Aortic atherosclerosis. No consolidation or pleural effusion. No pneumothorax. Mild wedging  of mid to lower thoracic vertebra. IMPRESSION: 1. No focal pulmonary infiltrate 2. Mild cardiomegaly Electronically Signed   By: Jasmine PangKim  Fujinaga M.D.   On: 10/01/2017 15:00     Medical Consultants:    None.  Anti-Infectives:   IV Rocephin  Subjective:    Diana HaringAda B Hartman no new complains  Objective:    Vitals:   10/02/17 1340 10/02/17 2106 10/03/17 0513 10/03/17 0939  BP: (!) 120/43 (!) 115/53 124/68   Pulse: (!) 53 (!) 55 74   Resp: 18 19 18    Temp: 99 F (37.2 C) 97.9 F (36.6 C) 97.9 F (36.6 C)   TempSrc: Oral Oral Oral   SpO2: 100% 100% 99% 95%  Weight:   63.9 kg (140 lb 14 oz)   Height:        Intake/Output Summary (Last 24 hours) at 10/03/17 1308 Last data filed at 10/03/17 0900  Gross per 24 hour  Intake          2286.67 ml  Output              300 ml  Net          1986.67 ml   Filed Weights   10/01/17 1700 10/02/17 0543 10/03/17 0513  Weight: 59.6 kg (131 lb 6.3 oz) 61.4 kg (135 lb 5.8 oz) 63.9 kg (140 lb 14 oz)    Exam: General exam: In no acute distress.  Laying comfortably in bed Respiratory system: good air movement and clear to auscultation Cardiovascular system: S1 & S2 heard, RRR. Gastrointestinal system: positive bowel sounds soft nontender nondistended Central nervous system: awake alert and oriented 1 nonfocal Extremities: no lower extremity edema Skin: couple bruises on her right lateral thigh   Data Reviewed:    Labs: Basic Metabolic Panel:  Recent Labs Lab 10/01/17 2304 10/02/17 0226 10/02/17 1115 10/02/17 2133 10/03/17 1008  NA 140 141 137 139 140  K 3.5 3.2* 3.5 4.1 4.0  CL 103 105 100* 108 107  CO2 27 27 29 26 25   GLUCOSE 421* 222* 325* 213* 249*  BUN 31* 32* 30* 34* 23*  CREATININE 1.01* 1.05* 0.98 1.21* 0.77  CALCIUM 8.3* 8.4* 8.3* 8.3* 8.4*   GFR Estimated Creatinine Clearance: 53 mL/min (by C-G formula based on SCr of 0.77 mg/dL). Liver Function Tests:  Recent Labs Lab 10/01/17 1300  AST 21  ALT 17    ALKPHOS 175*  BILITOT 0.9  PROT 10.1*  ALBUMIN 3.6   No results for input(s): LIPASE, AMYLASE in the last 168 hours. No results for input(s): AMMONIA in the last 168 hours. Coagulation profile No results for input(s): INR, PROTIME in the last 168 hours.  CBC:  Recent Labs Lab 10/01/17 1300 10/02/17 0226  WBC 11.2* 14.2*  NEUTROABS 7.2  --   HGB 13.5 11.2*  HCT 40.1 34.6*  MCV 92.4 92.8  PLT 165 142*   Cardiac Enzymes: No results for input(s): CKTOTAL, CKMB, CKMBINDEX, TROPONINI in the last 168 hours. BNP (last 3 results) No results for input(s): PROBNP in the last 8760 hours. CBG:  Recent Labs Lab 10/02/17 1131 10/02/17 1631 10/02/17 2104 10/03/17 0744 10/03/17 1136  GLUCAP 320* 206* 224* 158* 258*   D-Dimer: No results for input(s): DDIMER in the last 72 hours. Hgb A1c: No results for input(s): HGBA1C in the last 72 hours. Lipid Profile: No results for input(s): CHOL, HDL, LDLCALC, TRIG, CHOLHDL, LDLDIRECT in the last 72 hours. Thyroid function studies: No results for input(s): TSH, T4TOTAL, T3FREE, THYROIDAB in the last 72 hours.  Invalid input(s): FREET3 Anemia work up: No results for input(s): VITAMINB12, FOLATE, FERRITIN, TIBC, IRON, RETICCTPCT in the last 72 hours. Sepsis Labs:  Recent Labs Lab 10/01/17 1300 10/01/17 1406 10/01/17 1454 10/02/17 0226  WBC 11.2*  --   --  14.2*  LATICACIDVEN  --  1.67 1.60  --    Microbiology Recent Results (from the past 240 hour(s))  Urine Culture     Status: Abnormal   Collection Time: 10/01/17 12:46 PM  Result Value Ref Range Status   Specimen Description URINE, RANDOM  Final   Special Requests NONE  Final   Culture >=100,000 COLONIES/mL ESCHERICHIA COLI (A)  Final   Report Status 10/03/2017 FINAL  Final   Organism ID, Bacteria ESCHERICHIA COLI (A)  Final      Susceptibility   Escherichia coli - MIC*    AMPICILLIN <=2 SENSITIVE Sensitive     CEFAZOLIN <=4 SENSITIVE Sensitive     CEFTRIAXONE <=1  SENSITIVE Sensitive     CIPROFLOXACIN <=0.25 SENSITIVE Sensitive     GENTAMICIN <=1 SENSITIVE Sensitive     IMIPENEM <=0.25 SENSITIVE Sensitive     NITROFURANTOIN <=16 SENSITIVE Sensitive     TRIMETH/SULFA <=20 SENSITIVE Sensitive     AMPICILLIN/SULBACTAM <=2 SENSITIVE Sensitive     PIP/TAZO <=4 SENSITIVE Sensitive     Extended ESBL NEGATIVE Sensitive     * >=100,000 COLONIES/mL ESCHERICHIA COLI  Medications:   . amoxicillin  500 mg Oral Q8H  . diltiazem  120 mg Oral QID  . donepezil  10 mg Oral Daily  . enoxaparin (LOVENOX) injection  40 mg Subcutaneous Q24H  . insulin aspart  0-9 Units Subcutaneous TID WC  . insulin detemir  5 Units Subcutaneous BID  . lisinopril  40 mg Oral Daily  . nebivolol  5 mg Oral Daily  . sertraline  50 mg Oral QHS  . zolpidem  5 mg Oral QHS   Continuous Infusions:     LOS: 2 days   Marinda Elk  [facility] Pager [phone]  *Please refer to [email or web address], password [email or web address] to get updated schedule on who will round on this patient, as hospitalists switch teams weekly. If 7PM-7AM, please contact night-coverage at [email or web address], password [email or web address] for any overnight needs.  10/03/2017, 1:08 PM

## 2017-10-03 DIAGNOSIS — N179 Acute kidney failure, unspecified: Secondary | ICD-10-CM

## 2017-10-03 LAB — BASIC METABOLIC PANEL
ANION GAP: 6 (ref 5–15)
Anion gap: 8 (ref 5–15)
BUN: 23 mg/dL — ABNORMAL HIGH (ref 6–20)
BUN: 19 mg/dL (ref 6–20)
CALCIUM: 8.4 mg/dL — AB (ref 8.9–10.3)
CALCIUM: 8.5 mg/dL — AB (ref 8.9–10.3)
CHLORIDE: 107 mmol/L (ref 101–111)
CO2: 25 mmol/L (ref 22–32)
CO2: 23 mmol/L (ref 22–32)
CREATININE: 0.77 mg/dL (ref 0.44–1.00)
Chloride: 108 mmol/L (ref 101–111)
Creatinine, Ser: 0.71 mg/dL (ref 0.44–1.00)
GFR calc Af Amer: >60 mL/min (ref >60)
Glucose, Bld: 249 mg/dL — ABNORMAL HIGH (ref 65–99)
Glucose, Bld: 192 mg/dL — ABNORMAL HIGH (ref 65–99)
POTASSIUM: 4.5 mmol/L (ref 3.5–5.1)
Potassium: 4 mmol/L (ref 3.5–5.1)
SODIUM: 140 mmol/L (ref 135–145)
SODIUM: 137 mmol/L (ref 135–145)

## 2017-10-03 LAB — URINE CULTURE: Culture: >=100000 — AB

## 2017-10-03 LAB — GLUCOSE, CAPILLARY
GLUCOSE-CAPILLARY: 158 mg/dL — AB (ref 65–99)
GLUCOSE-CAPILLARY: 328 mg/dL — AB (ref 65–99)
Glucose-Capillary: 258 mg/dL — ABNORMAL HIGH (ref 65–99)
Glucose-Capillary: 212 mg/dL — ABNORMAL HIGH (ref 65–99)

## 2017-10-03 MED ORDER — AMOXICILLIN 250 MG/5ML PO SUSR: 500.0000 mg | Freq: Three times a day (TID) | ORAL | 0 refills | Status: DC

## 2017-10-03 MED ORDER — AMOXICILLIN 250 MG/5ML PO SUSR
500.0000 mg | Freq: Three times a day (TID) | ORAL | Status: DC
  Administered 2017-10-03 – 2017-10-04 (×4): 500 mg via ORAL
  Filled 2017-10-03 (×5): qty 10

## 2017-10-03 MED ORDER — AMOXICILLIN 500 MG PO CAPS
500.0000 mg | ORAL_CAPSULE | Freq: Two times a day (BID) | ORAL | Status: DC
  Filled 2017-10-03: qty 1

## 2017-10-03 MED ORDER — AMOXICILLIN 250 MG/5ML PO SUSR: 500.0000 mg | Freq: Three times a day (TID) | ORAL | 0 refills | Status: AC

## 2017-10-03 NOTE — Discharge Summary (Signed)
Physician Discharge Summary  Diana Hartman ZOX:[id number] DOB: 1941-04-05 DOA: 10/01/2017  PCP: Darci Needle, MD  Admit date: 10/01/2017 Discharge date: 10/03/2017  Admitted From: Home  Disposition:  Home   Recommendations for Outpatient Follow-up:  1. Follow up with PCP in 1-2 weeks   Home Health:Yes  Equipment/Devices:None   Discharge Condition: Stable CODE STATUS:Full  Diet recommendation: Heart Healthy / Carb Modified / Regular / Dysphagia   Brief/Interim Summary: 76 y.o. female past medical history of dementia, diabetes, hypertension, temporal arteritis and selective mutism presents the emergency room with decreased activity and talking.  Daughter states that the patient in the past has had episodes where she is dehydrated or has urinary tract infection and she becomes less active.  Brought her here because of that concern  Discharge Diagnoses:  Principal Problem:   Hypernatremia Active Problems:   Diabetes mellitus, type II (HCC)   Dementia   Lower urinary tract infectious disease   Dehydration   Hypernatremia Likely due to hypovolemia, resolved with IV hydration.  Acute kidney injury: Likely prerenal in etiology was started on half normal saline and resolved.  Diabetes mellitus, type II (HCC): No changes made.  Probable urinary tract infection: Probably contributing to her decrease activity, was started empirically on IV rocephin, UC showed E. Coli sen to PCN. She will cont this for 4 additional days.  Dementia Without aggressive behavior Stable non changes made to her medications.  Discharge Instructions  Discharge Instructions    Diet - low sodium heart healthy    Complete by:  As directed    Increase activity slowly    Complete by:  As directed      Allergies as of 10/03/2017   No Known Allergies     Medication List    STOP taking these medications   cephALEXin 250 MG/5ML suspension Commonly known as:  KEFLEX     TAKE these  medications   acetaminophen 325 MG tablet Commonly known as:  TYLENOL Take 2 tablets (650 mg total) by mouth every 6 (six) hours as needed for mild pain (or Fever >/= 101).   amoxicillin 250 MG/5ML suspension Commonly known as:  AMOXIL Take 10 mLs (500 mg total) by mouth every 8 (eight) hours.   diltiazem 120 MG tablet Commonly known as:  CARDIZEM Take 120 mg by mouth 4 (four) times daily. What changed:  Another medication with the same name was removed. Continue taking this medication, and follow the directions you see here.   donepezil 10 MG tablet Commonly known as:  ARICEPT Take 10 mg by mouth daily.   Geritol Liqd Take 5 mLs by mouth daily.   GLUCERNA Liqd Take 237 mLs by mouth 2 (two) times daily between meals.   insulin detemir 100 UNIT/ML injection Commonly known as:  LEVEMIR Inject 0.1 mLs (10 Units total) into the skin 2 (two) times daily.   lisinopril 40 MG tablet Commonly known as:  PRINIVIL,ZESTRIL Take 40 mg by mouth daily.   metFORMIN 1000 MG tablet Commonly known as:  GLUCOPHAGE Take 1,000 mg by mouth daily with breakfast.   nebivolol 5 MG tablet Commonly known as:  BYSTOLIC Take 5 mg by mouth daily.   sertraline 50 MG tablet Commonly known as:  ZOLOFT Take 50 mg by mouth at bedtime.   traMADol 50 MG tablet Commonly known as:  ULTRAM Take 1 tablet (50 mg total) by mouth every 6 (six) hours as needed.   zolpidem 10 MG tablet Commonly known as:  AMBIEN Take  10 mg by mouth at bedtime.       No Known Allergies   Consultations:None   Procedures/Studies: Dg Chest 2 View  Result Date: 10/01/2017 CLINICAL DATA:  Fever, altered EXAM: CHEST  2 VIEW COMPARISON:  07/04/2015 FINDINGS: Mild cardiomegaly. Aortic atherosclerosis. No consolidation or pleural effusion. No pneumothorax. Mild wedging of mid to lower thoracic vertebra. IMPRESSION: 1. No focal pulmonary infiltrate 2. Mild cardiomegaly Electronically Signed   By: Jasmine PangKim  Fujinaga M.D.   On:  10/01/2017 15:00    (Echo, Carotid, EGD, Colonoscopy, ERCP)    Subjective:  No complains as per family seems to be at baseline Discharge Exam: Vitals:   10/03/17 0513 10/03/17 0939  BP: 124/68   Pulse: 74   Resp: 18   Temp: 97.9 F (36.6 C)   SpO2: 99% 95%   Vitals:   10/02/17 1340 10/02/17 2106 10/03/17 0513 10/03/17 0939  BP: (!) 120/43 (!) 115/53 124/68   Pulse: (!) 53 (!) 55 74   Resp: 18 19 18    Temp: 99 F (37.2 C) 97.9 F (36.6 C) 97.9 F (36.6 C)   TempSrc: Oral Oral Oral   SpO2: 100% 100% 99% 95%  Weight:   63.9 kg (140 lb 14 oz)   Height:        General: Pt is alert, awake, not in acute distress Cardiovascular: RRR, S1/S2 +, no rubs, no gallops Respiratory: CTA bilaterally, no wheezing, no rhonchi Abdominal: Soft, NT, ND, bowel sounds + Extremities: no edema, no cyanosis    The results of significant diagnostics from this hospitalization (including imaging, microbiology, ancillary and laboratory) are listed below for reference.     Microbiology: Recent Results (from the past 240 hour(s))  Urine Culture     Status: Abnormal   Collection Time: 10/01/17 12:46 PM  Result Value Ref Range Status   Specimen Description URINE, RANDOM  Final   Special Requests NONE  Final   Culture >=100,000 COLONIES/mL ESCHERICHIA COLI (A)  Final   Report Status 10/03/2017 FINAL  Final   Organism ID, Bacteria ESCHERICHIA COLI (A)  Final      Susceptibility   Escherichia coli - MIC*    AMPICILLIN <=2 SENSITIVE Sensitive     CEFAZOLIN <=4 SENSITIVE Sensitive     CEFTRIAXONE <=1 SENSITIVE Sensitive     CIPROFLOXACIN <=0.25 SENSITIVE Sensitive     GENTAMICIN <=1 SENSITIVE Sensitive     IMIPENEM <=0.25 SENSITIVE Sensitive     NITROFURANTOIN <=16 SENSITIVE Sensitive     TRIMETH/SULFA <=20 SENSITIVE Sensitive     AMPICILLIN/SULBACTAM <=2 SENSITIVE Sensitive     PIP/TAZO <=4 SENSITIVE Sensitive     Extended ESBL NEGATIVE Sensitive     * >=100,000 COLONIES/mL ESCHERICHIA  COLI     Labs: BNP (last 3 results) No results for input(s): BNP in the last 8760 hours. Basic Metabolic Panel:  Recent Labs Lab 10/01/17 2304 10/02/17 0226 10/02/17 1115 10/02/17 2133 10/03/17 1008  NA 140 141 137 139 140  K 3.5 3.2* 3.5 4.1 4.0  CL 103 105 100* 108 107  CO2 27 27 29 26 25   GLUCOSE 421* 222* 325* 213* 249*  BUN 31* 32* 30* 34* 23*  CREATININE 1.01* 1.05* 0.98 1.21* 0.77  CALCIUM 8.3* 8.4* 8.3* 8.3* 8.4*   Liver Function Tests:  Recent Labs Lab 10/01/17 1300  AST 21  ALT 17  ALKPHOS 175*  BILITOT 0.9  PROT 10.1*  ALBUMIN 3.6   No results for input(s): LIPASE, AMYLASE in the last 168  hours. No results for input(s): AMMONIA in the last 168 hours. CBC:  Recent Labs Lab 10/01/17 1300 10/02/17 0226  WBC 11.2* 14.2*  NEUTROABS 7.2  --   HGB 13.5 11.2*  HCT 40.1 34.6*  MCV 92.4 92.8  PLT 165 142*   Cardiac Enzymes: No results for input(s): CKTOTAL, CKMB, CKMBINDEX, TROPONINI in the last 168 hours. BNP: Invalid input(s): POCBNP CBG:  Recent Labs Lab 10/02/17 1131 10/02/17 1631 10/02/17 2104 10/03/17 0744 10/03/17 1136  GLUCAP 320* 206* 224* 158* 258*   D-Dimer No results for input(s): DDIMER in the last 72 hours. Hgb A1c No results for input(s): HGBA1C in the last 72 hours. Lipid Profile No results for input(s): CHOL, HDL, LDLCALC, TRIG, CHOLHDL, LDLDIRECT in the last 72 hours. Thyroid function studies No results for input(s): TSH, T4TOTAL, T3FREE, THYROIDAB in the last 72 hours.  Invalid input(s): FREET3 Anemia work up No results for input(s): VITAMINB12, FOLATE, FERRITIN, TIBC, IRON, RETICCTPCT in the last 72 hours. Urinalysis    Component Value Date/Time   COLORURINE YELLOW 10/01/2017 1246   APPEARANCEUR TURBID (A) 10/01/2017 1246   LABSPEC 1.022 10/01/2017 1246   PHURINE 5.0 10/01/2017 1246   GLUCOSEU >=500 (A) 10/01/2017 1246   HGBUR LARGE (A) 10/01/2017 1246   BILIRUBINUR NEGATIVE 10/01/2017 1246   KETONESUR  NEGATIVE 10/01/2017 1246   PROTEINUR 100 (A) 10/01/2017 1246   UROBILINOGEN 1.0 07/04/2015 1220   NITRITE NEGATIVE 10/01/2017 1246   LEUKOCYTESUR LARGE (A) 10/01/2017 1246   Sepsis Labs Invalid input(s): PROCALCITONIN,  WBC,  LACTICIDVEN Microbiology Recent Results (from the past 240 hour(s))  Urine Culture     Status: Abnormal   Collection Time: 10/01/17 12:46 PM  Result Value Ref Range Status   Specimen Description URINE, RANDOM  Final   Special Requests NONE  Final   Culture >=100,000 COLONIES/mL ESCHERICHIA COLI (A)  Final   Report Status 10/03/2017 FINAL  Final   Organism ID, Bacteria ESCHERICHIA COLI (A)  Final      Susceptibility   Escherichia coli - MIC*    AMPICILLIN <=2 SENSITIVE Sensitive     CEFAZOLIN <=4 SENSITIVE Sensitive     CEFTRIAXONE <=1 SENSITIVE Sensitive     CIPROFLOXACIN <=0.25 SENSITIVE Sensitive     GENTAMICIN <=1 SENSITIVE Sensitive     IMIPENEM <=0.25 SENSITIVE Sensitive     NITROFURANTOIN <=16 SENSITIVE Sensitive     TRIMETH/SULFA <=20 SENSITIVE Sensitive     AMPICILLIN/SULBACTAM <=2 SENSITIVE Sensitive     PIP/TAZO <=4 SENSITIVE Sensitive     Extended ESBL NEGATIVE Sensitive     * >=100,000 COLONIES/mL ESCHERICHIA COLI     Time coordinating discharge: Over 30 minutes  SIGNED:   Marinda Elk, MD  [facility] 10/03/2017, 1:57 PM Pager   If 7PM-7AM, please contact night-coverage [email or web address] Password [email or web address]

## 2017-10-03 NOTE — Care Management Note (Addendum)
Case Management Note  Patient Details  Name: Jamelle Haringda B Marbach MRN: [id number] Date of Birth: [birth year]  Subjective/Objective:    Hypernatremia, DM, dementia, UTI, dehydration                Action/Plan: Discharge Planning: NCM spoke to pt's dtr, Gavin Poundeborah via phone. [facility] and aide. Pt lives in home with her son who recently had open heart surgery and limited as to turning and assisting with ADL's. Dtr is requesting a hospital bed and hoyer lift. Attending notified. Pt has RW, bedside commode and wheelchair at home. Dtr is requesting DME be delivered prior to pt dc home. Contacted [facility] with new referral for Hospital bed and hoyer lift. [facility] states they will attempt to deliver DME today. Explained to dtr that incontinence supplies and glucerna are not covered by Medicare. Some incontinence supplies are covered under Medicaid. States they did apply for Medicaid in the past but she did not qualify. She plans to reapply. Dtr plans to follow up on [facility].   PCP Darci NeedleKOHUT, WALTER MD  Expected Discharge Date:  10/03/17               Expected Discharge Plan:  Home w Home Health Services  In-House Referral:  NA  Discharge planning Services  CM Consult  Post Acute Care Choice:  Home Health Choice offered to:  Adult Children  DME Arranged:  Hospital bed DME Agency:  [facility]  HH Arranged:  PT, OT, Nurse's Aide HH Agency:  [facility]  Status of Service:  Completed, signed off  If discussed at Long Length of Stay Meetings, dates discussed:    Additional Comments:  Elliot CousinShavis, Servando Kyllonen Ellen, RN 10/03/2017, 3:15 PM

## 2017-10-03 NOTE — Progress Notes (Addendum)
..      Durable Medical Equipment        Start     Ordered   10/03/17 1444  For home use only DME Hospital bed  Once    Question Answer Comment  Patient has (list medical condition): dementia with agressive behavior, type II diabetes, dehydration   The above medical condition requires: Patient requires the ability to reposition frequently   Head must be elevated greater than: 30 degrees   Bed type Semi-electric   Hoyer Lift Yes   Support Surface: Gel Overlay      10/03/17 1443

## 2017-10-03 NOTE — Progress Notes (Addendum)
Progress Note for the Evaluation of Need for a Hospital Bed Patient Name: ___[name]__________________________________         DOB: ____12/17/2020____________________________ Diagnosis Codes: __FO3.90, E43, G93.40_________________________________               Height: __5'3"________        Weight: _140 lb________   Other Health Conditions  Patient has dementia with aggressive behavior, and high risk for aspiration___which requires ___head and back_ to be positioned in ways not feasible with a normal bed. Head must be elevated at least _30 degrees__to decrease agitation and anxiety  Head and lower extremity requires frequent and immediate changes in body position which cannot be achieved with a normal bed.  Clinician Signature/Credentials: _____Alesia UEAVWUShavis RN CCM Date: ____10/28/2018___________  Isidoro DonningAlesia Kailia Starry RN CCM Case Mgmt phone [phone]

## 2017-10-03 NOTE — Progress Notes (Signed)
Per patient's daughter Gavin PoundDeborah equipment will be delivered tomorrow between 12-4pm.  MD notified via text page.

## 2017-10-03 NOTE — Evaluation (Addendum)
Physical Therapy Evaluation Patient Details Name: Diana Hartman MRN: [id number] DOB: 01-26-1941 Today's Date: 10/03/2017   History of Present Illness  76 y.o. female past medical history of dementia, diabetes, hypertension, temporal arteritis and selective mutism presents the emergency room with decreased activity and talking.   Clinical Impression  Pt requires total assist for bed mobility and for sitting balance. No family available to provide prior functional level. Pt needs 24* assistance at home vs SNF if family not able to care for her. PT signing off as pt is total care, which appears to be baseline, and cannot participate in PT due to dementia.     Follow Up Recommendations Supervision/Assistance - 24 hour;SNF (24* care at home if family able vs SNF)    Equipment Recommendations  None recommended by PT (WC, WC cushion, hospital bed if family doesn't have them)   Recommendations for Other Services       Precautions / Restrictions Precautions Precautions: Fall Restrictions Weight Bearing Restrictions: No      Mobility  Bed Mobility Overal bed mobility: Needs Assistance Bed Mobility: Supine to Sit;Sit to Supine     Supine to sit: Total assist;+2 for physical assistance Sit to supine: +2 for physical assistance;Total assist   General bed mobility comments: pt 0%, total assist for supine to sit and then to return to supine  Transfers                 General transfer comment: lift recommended, pt unable to sit on EOB due to poor balance  Ambulation/Gait                Stairs            Wheelchair Mobility    Modified Rankin (Stroke Patients Only)       Balance Overall balance assessment: Needs assistance Sitting-balance support: Feet supported;Bilateral upper extremity supported Sitting balance-Leahy Scale: Zero Sitting balance - Comments: total assist for balance                                     Pertinent  Vitals/Pain Pain Assessment: Faces Faces Pain Scale: No hurt    Home Living Family/patient expects to be discharged to:: Private residence Living Arrangements: Children (per chart)               Additional Comments: no family present, pt unable to provide hx    Prior Function Level of Independence: Needs assistance         Comments: no family available, pt was total care at home per RN     Hand Dominance        Extremity/Trunk Assessment   Upper Extremity Assessment Upper Extremity Assessment: Defer to OT evaluation    Lower Extremity Assessment Lower Extremity Assessment: Overall WFL for tasks assessed    Cervical / Trunk Assessment Cervical / Trunk Assessment: Kyphotic  Communication   Communication: No difficulties  Cognition Arousal/Alertness: Awake/alert Behavior During Therapy: WFL for tasks assessed/performed Overall Cognitive Status: No family/caregiver present to determine baseline cognitive functioning                                 General Comments: pt not oriented to self, situation, location, year; verbalizes but doesn't respond appropriately to questions      General Comments      Exercises  Assessment/Plan    PT Assessment Patent does not need any further PT services  PT Problem List         PT Treatment Interventions      PT Goals (Current goals can be found in the Care Plan section)  Acute Rehab PT Goals PT Goal Formulation: Patient unable to participate in goal setting    Frequency     Barriers to discharge        Co-evaluation               AM-PAC PT "6 Clicks" Daily Activity  Outcome Measure Difficulty turning over in bed (including adjusting bedclothes, sheets and blankets)?: Unable Difficulty moving from lying on back to sitting on the side of the bed? : Unable Difficulty sitting down on and standing up from a chair with arms (e.g., wheelchair, bedside commode, etc,.)?: Unable Help needed  moving to and from a bed to chair (including a wheelchair)?: Total Help needed walking in hospital room?: Total Help needed climbing 3-5 steps with a railing? : Total 6 Click Score: 6    End of Session   Activity Tolerance: Patient tolerated treatment well Patient left: in bed (bed alarm not functioning, RN notified) Nurse Communication: Mobility status;Need for lift equipment      Time: 1610-9604 PT Time Calculation (min) (ACUTE ONLY): 8 min   Charges:   PT Evaluation $PT Eval Low Complexity: 1 Low     PT G Codes:          Tamala Ser 10/03/2017, 12:50 PM  [phone]

## 2017-10-04 LAB — BASIC METABOLIC PANEL
ANION GAP: 9 (ref 5–15)
BUN: 12 mg/dL (ref 6–20)
CO2: 22 mmol/L (ref 22–32)
CREATININE: 0.69 mg/dL (ref 0.44–1.00)
Calcium: 8.8 mg/dL — ABNORMAL LOW (ref 8.9–10.3)
Chloride: 105 mmol/L (ref 101–111)
GFR calc Af Amer: >60 mL/min (ref >60)
GLUCOSE: 263 mg/dL — AB (ref 65–99)
Potassium: 3.7 mmol/L (ref 3.5–5.1)
SODIUM: 136 mmol/L (ref 135–145)

## 2017-10-04 LAB — GLUCOSE, CAPILLARY
GLUCOSE-CAPILLARY: 321 mg/dL — AB (ref 65–99)
Glucose-Capillary: 307 mg/dL — ABNORMAL HIGH (ref 65–99)
Glucose-Capillary: 178 mg/dL — ABNORMAL HIGH (ref 65–99)

## 2017-10-04 NOTE — Care Management Important Message (Signed)
Important Message  Patient Details  Name: Diana Hartman MRN: [id number] Date of Birth: 11-05-1941   Medicare Important Message Given:  Yes    Caren MacadamFuller, Raeleigh Guinn 10/04/2017, 11:31 AM

## 2017-10-04 NOTE — Progress Notes (Signed)
[facility] PROGRESS NOTE    Progress Note  Diana Haringda B Harada  ZOX:[id number] DOB: March 16, 1941 DOA: 10/01/2017 PCP: Darci NeedleKohut, Walter, MD     Brief Narrative:   Diana Hartman is an 76 y.o. female past medical history of dementia diabetes presents to the emergency room due to decreased activity.  Assessment/Plan:   Hypernatremia Acute kidney injury: Diabetes mellitus, type II (HCC): Probable urinary tract infection: Dementia Without aggressive behavior Haldol PRN  No events overnight she was continue on her oral antibiotics. She is awaiting delivery of her equipment in order to be discharged home.  DVT prophylaxis: lovenox Family Communication:none Disposition Plan/Barrier to D/C: home in today Code Status:     Code Status Orders        Start     Ordered   10/01/17 1504  Full code  Continuous     10/01/17 1505    Code Status History    Date Active Date Inactive Code Status Order ID Comments User Context   05/17/2016  3:41 AM 05/19/2016  6:32 PM Full Code [id number]  Briscoe Deutscherpyd, Timothy S, MD Inpatient   07/04/2015  5:18 PM 07/06/2015  3:12 PM Full Code [id number]  Edsel PetrinMikhail, Maryann, DO Inpatient    Advance Directive Documentation     Most Recent Value  Type of Advance Directive  Healthcare Power of Attorney  Pre-existing out of facility DNR order (yellow form or pink MOST form)  -  "MOST" Form in Place?  -        IV Access:    Peripheral IV   Procedures and diagnostic studies:   No results found.   Medical Consultants:    None.  Anti-Infectives:   IV Rocephin  Subjective:    Diana HaringAda B Leavitt no new complains  Objective:    Vitals:   10/03/17 1502 10/03/17 2035 10/04/17 0503 10/04/17 0900  BP: (!) 112/91 116/66 (!) 149/89   Pulse: (!) 51 (!) 50 71   Resp: 18 20 16    Temp: 98.1 F (36.7 C) 98.6 F (37 C) 98.3 F (36.8 C)   TempSrc:  Oral Oral   SpO2: 100% 99% 100% 96%  Weight:      Height:        Intake/Output Summary (Last 24 hours)  at 10/04/17 1011 Last data filed at 10/04/17 0907  Gross per 24 hour  Intake              720 ml  Output             2750 ml  Net            -2030 ml   Filed Weights   10/01/17 1700 10/02/17 0543 10/03/17 0513  Weight: 59.6 kg (131 lb 6.3 oz) 61.4 kg (135 lb 5.8 oz) 63.9 kg (140 lb 14 oz)    Exam: General exam: In no acute distress. Laying comfortably in bed Respiratory system: good air movement and clear to auscultation Cardiovascular system: S1 & S2 heard, RRR. Gastrointestinal system: positive bowel sounds soft nontender nondistended Central nervous system: awake alert and oriented 1 nonfocal Extremities: no lower extremity edema Skin: couple bruises on her right lateral thigh   Data Reviewed:    Labs: Basic Metabolic Panel:  Recent Labs Lab 10/02/17 0226 10/02/17 1115 10/02/17 2133 10/03/17 1008 10/03/17 2200  NA 141 137 139 140 137  K 3.2* 3.5 4.1 4.0 4.5  CL 105 100* 108 107 108  CO2 27 29 26 25 23   GLUCOSE 222* 325*  213* 249* 192*  BUN 32* 30* 34* 23* 19  CREATININE 1.05* 0.98 1.21* 0.77 0.71  CALCIUM 8.4* 8.3* 8.3* 8.4* 8.5*   GFR Estimated Creatinine Clearance: 53 mL/min (by C-G formula based on SCr of 0.71 mg/dL). Liver Function Tests:  Recent Labs Lab 10/01/17 1300  AST 21  ALT 17  ALKPHOS 175*  BILITOT 0.9  PROT 10.1*  ALBUMIN 3.6   No results for input(s): LIPASE, AMYLASE in the last 168 hours. No results for input(s): AMMONIA in the last 168 hours. Coagulation profile No results for input(s): INR, PROTIME in the last 168 hours.  CBC:  Recent Labs Lab 10/01/17 1300 10/02/17 0226  WBC 11.2* 14.2*  NEUTROABS 7.2  --   HGB 13.5 11.2*  HCT 40.1 34.6*  MCV 92.4 92.8  PLT 165 142*   Cardiac Enzymes: No results for input(s): CKTOTAL, CKMB, CKMBINDEX, TROPONINI in the last 168 hours. BNP (last 3 results) No results for input(s): PROBNP in the last 8760 hours. CBG:  Recent Labs Lab 10/03/17 0744 10/03/17 1136 10/03/17 1622  10/03/17 2038 10/04/17 0723  GLUCAP 158* 258* 328* 212* 321*   D-Dimer: No results for input(s): DDIMER in the last 72 hours. Hgb A1c: No results for input(s): HGBA1C in the last 72 hours. Lipid Profile: No results for input(s): CHOL, HDL, LDLCALC, TRIG, CHOLHDL, LDLDIRECT in the last 72 hours. Thyroid function studies: No results for input(s): TSH, T4TOTAL, T3FREE, THYROIDAB in the last 72 hours.  Invalid input(s): FREET3 Anemia work up: No results for input(s): VITAMINB12, FOLATE, FERRITIN, TIBC, IRON, RETICCTPCT in the last 72 hours. Sepsis Labs:  Recent Labs Lab 10/01/17 1300 10/01/17 1406 10/01/17 1454 10/02/17 0226  WBC 11.2*  --   --  14.2*  LATICACIDVEN  --  1.67 1.60  --    Microbiology Recent Results (from the past 240 hour(s))  Urine Culture     Status: Abnormal   Collection Time: 10/01/17 12:46 PM  Result Value Ref Range Status   Specimen Description URINE, RANDOM  Final   Special Requests NONE  Final   Culture >=100,000 COLONIES/mL ESCHERICHIA COLI (A)  Final   Report Status 10/03/2017 FINAL  Final   Organism ID, Bacteria ESCHERICHIA COLI (A)  Final      Susceptibility   Escherichia coli - MIC*    AMPICILLIN <=2 SENSITIVE Sensitive     CEFAZOLIN <=4 SENSITIVE Sensitive     CEFTRIAXONE <=1 SENSITIVE Sensitive     CIPROFLOXACIN <=0.25 SENSITIVE Sensitive     GENTAMICIN <=1 SENSITIVE Sensitive     IMIPENEM <=0.25 SENSITIVE Sensitive     NITROFURANTOIN <=16 SENSITIVE Sensitive     TRIMETH/SULFA <=20 SENSITIVE Sensitive     AMPICILLIN/SULBACTAM <=2 SENSITIVE Sensitive     PIP/TAZO <=4 SENSITIVE Sensitive     Extended ESBL NEGATIVE Sensitive     * >=100,000 COLONIES/mL ESCHERICHIA COLI     Medications:   . amoxicillin  500 mg Oral Q8H  . diltiazem  120 mg Oral QID  . donepezil  10 mg Oral Daily  . enoxaparin (LOVENOX) injection  40 mg Subcutaneous Q24H  . insulin aspart  0-9 Units Subcutaneous TID WC  . insulin detemir  5 Units Subcutaneous BID  .  lisinopril  40 mg Oral Daily  . nebivolol  5 mg Oral Daily  . sertraline  50 mg Oral QHS  . zolpidem  5 mg Oral QHS   Continuous Infusions:     LOS: 3 days   FELIZ ORTIZ, ABRAHAM  [facility] Pager [phone]  *Please refer to [email or web address], password [email or web address] to get updated schedule on who will round on this patient, as hospitalists switch teams weekly. If 7PM-7AM, please contact night-coverage at [email or web address], password [email or web address] for any overnight needs.  10/04/2017, 10:11 AM

## 2017-10-04 NOTE — Progress Notes (Signed)
Date: October 04, 2017 Discharge orders review for case management needs.  [facility] Per patient or family member no additional needs at home. Marcelle Smilinghonda Davis, BSN, RN3, CCM:  [phone]

## 2018-01-07 DIAGNOSIS — R413 Other amnesia: Secondary | ICD-10-CM | POA: Diagnosis not present

## 2018-02-18 DIAGNOSIS — I10 Essential (primary) hypertension: Secondary | ICD-10-CM | POA: Diagnosis not present

## 2018-02-18 DIAGNOSIS — E119 Type 2 diabetes mellitus without complications: Secondary | ICD-10-CM | POA: Diagnosis not present

## 2018-02-25 DIAGNOSIS — R748 Abnormal levels of other serum enzymes: Secondary | ICD-10-CM | POA: Diagnosis not present

## 2018-02-25 DIAGNOSIS — D649 Anemia, unspecified: Secondary | ICD-10-CM | POA: Diagnosis not present

## 2018-02-25 DIAGNOSIS — I10 Essential (primary) hypertension: Secondary | ICD-10-CM | POA: Diagnosis not present

## 2018-02-25 DIAGNOSIS — E118 Type 2 diabetes mellitus with unspecified complications: Secondary | ICD-10-CM | POA: Diagnosis not present

## 2018-03-15 ENCOUNTER — Ambulatory Visit: Payer: Self-pay | Admitting: Podiatry

## 2018-04-08 DIAGNOSIS — Z9181 History of falling: Secondary | ICD-10-CM | POA: Diagnosis not present

## 2018-04-08 DIAGNOSIS — I10 Essential (primary) hypertension: Secondary | ICD-10-CM | POA: Diagnosis not present

## 2018-04-08 DIAGNOSIS — Z794 Long term (current) use of insulin: Secondary | ICD-10-CM | POA: Diagnosis not present

## 2018-04-08 DIAGNOSIS — N390 Urinary tract infection, site not specified: Secondary | ICD-10-CM | POA: Diagnosis not present

## 2018-04-08 DIAGNOSIS — R531 Weakness: Secondary | ICD-10-CM | POA: Diagnosis not present

## 2018-04-08 DIAGNOSIS — E119 Type 2 diabetes mellitus without complications: Secondary | ICD-10-CM | POA: Diagnosis not present

## 2018-04-08 DIAGNOSIS — D649 Anemia, unspecified: Secondary | ICD-10-CM | POA: Diagnosis not present

## 2018-04-14 DIAGNOSIS — I10 Essential (primary) hypertension: Secondary | ICD-10-CM | POA: Diagnosis not present

## 2018-04-14 DIAGNOSIS — R531 Weakness: Secondary | ICD-10-CM | POA: Diagnosis not present

## 2018-04-14 DIAGNOSIS — E119 Type 2 diabetes mellitus without complications: Secondary | ICD-10-CM | POA: Diagnosis not present

## 2018-04-14 DIAGNOSIS — Z794 Long term (current) use of insulin: Secondary | ICD-10-CM | POA: Diagnosis not present

## 2018-04-14 DIAGNOSIS — N390 Urinary tract infection, site not specified: Secondary | ICD-10-CM | POA: Diagnosis not present

## 2018-04-14 DIAGNOSIS — D649 Anemia, unspecified: Secondary | ICD-10-CM | POA: Diagnosis not present

## 2018-04-14 DIAGNOSIS — Z9181 History of falling: Secondary | ICD-10-CM | POA: Diagnosis not present

## 2018-04-15 DIAGNOSIS — N390 Urinary tract infection, site not specified: Secondary | ICD-10-CM | POA: Diagnosis not present

## 2018-04-15 DIAGNOSIS — D649 Anemia, unspecified: Secondary | ICD-10-CM | POA: Diagnosis not present

## 2018-04-15 DIAGNOSIS — Z9181 History of falling: Secondary | ICD-10-CM | POA: Diagnosis not present

## 2018-04-15 DIAGNOSIS — Z794 Long term (current) use of insulin: Secondary | ICD-10-CM | POA: Diagnosis not present

## 2018-04-15 DIAGNOSIS — R531 Weakness: Secondary | ICD-10-CM | POA: Diagnosis not present

## 2018-04-15 DIAGNOSIS — E119 Type 2 diabetes mellitus without complications: Secondary | ICD-10-CM | POA: Diagnosis not present

## 2018-04-15 DIAGNOSIS — I10 Essential (primary) hypertension: Secondary | ICD-10-CM | POA: Diagnosis not present

## 2018-04-18 DIAGNOSIS — D649 Anemia, unspecified: Secondary | ICD-10-CM | POA: Diagnosis not present

## 2018-04-18 DIAGNOSIS — Z9181 History of falling: Secondary | ICD-10-CM | POA: Diagnosis not present

## 2018-04-18 DIAGNOSIS — E119 Type 2 diabetes mellitus without complications: Secondary | ICD-10-CM | POA: Diagnosis not present

## 2018-04-18 DIAGNOSIS — N390 Urinary tract infection, site not specified: Secondary | ICD-10-CM | POA: Diagnosis not present

## 2018-04-18 DIAGNOSIS — Z794 Long term (current) use of insulin: Secondary | ICD-10-CM | POA: Diagnosis not present

## 2018-04-18 DIAGNOSIS — I10 Essential (primary) hypertension: Secondary | ICD-10-CM | POA: Diagnosis not present

## 2018-04-18 DIAGNOSIS — R531 Weakness: Secondary | ICD-10-CM | POA: Diagnosis not present

## 2018-04-19 DIAGNOSIS — D649 Anemia, unspecified: Secondary | ICD-10-CM | POA: Diagnosis not present

## 2018-04-19 DIAGNOSIS — N390 Urinary tract infection, site not specified: Secondary | ICD-10-CM | POA: Diagnosis not present

## 2018-04-19 DIAGNOSIS — Z794 Long term (current) use of insulin: Secondary | ICD-10-CM | POA: Diagnosis not present

## 2018-04-19 DIAGNOSIS — R531 Weakness: Secondary | ICD-10-CM | POA: Diagnosis not present

## 2018-04-19 DIAGNOSIS — I10 Essential (primary) hypertension: Secondary | ICD-10-CM | POA: Diagnosis not present

## 2018-04-19 DIAGNOSIS — Z9181 History of falling: Secondary | ICD-10-CM | POA: Diagnosis not present

## 2018-04-19 DIAGNOSIS — E119 Type 2 diabetes mellitus without complications: Secondary | ICD-10-CM | POA: Diagnosis not present

## 2018-04-22 DIAGNOSIS — Z794 Long term (current) use of insulin: Secondary | ICD-10-CM | POA: Diagnosis not present

## 2018-04-22 DIAGNOSIS — N390 Urinary tract infection, site not specified: Secondary | ICD-10-CM | POA: Diagnosis not present

## 2018-04-22 DIAGNOSIS — E119 Type 2 diabetes mellitus without complications: Secondary | ICD-10-CM | POA: Diagnosis not present

## 2018-04-22 DIAGNOSIS — I10 Essential (primary) hypertension: Secondary | ICD-10-CM | POA: Diagnosis not present

## 2018-04-22 DIAGNOSIS — Z9181 History of falling: Secondary | ICD-10-CM | POA: Diagnosis not present

## 2018-04-22 DIAGNOSIS — D649 Anemia, unspecified: Secondary | ICD-10-CM | POA: Diagnosis not present

## 2018-04-22 DIAGNOSIS — R531 Weakness: Secondary | ICD-10-CM | POA: Diagnosis not present

## 2018-04-25 DIAGNOSIS — N390 Urinary tract infection, site not specified: Secondary | ICD-10-CM | POA: Diagnosis not present

## 2018-04-25 DIAGNOSIS — Z794 Long term (current) use of insulin: Secondary | ICD-10-CM | POA: Diagnosis not present

## 2018-04-25 DIAGNOSIS — I10 Essential (primary) hypertension: Secondary | ICD-10-CM | POA: Diagnosis not present

## 2018-04-25 DIAGNOSIS — Z9181 History of falling: Secondary | ICD-10-CM | POA: Diagnosis not present

## 2018-04-25 DIAGNOSIS — R531 Weakness: Secondary | ICD-10-CM | POA: Diagnosis not present

## 2018-04-25 DIAGNOSIS — D649 Anemia, unspecified: Secondary | ICD-10-CM | POA: Diagnosis not present

## 2018-04-25 DIAGNOSIS — E119 Type 2 diabetes mellitus without complications: Secondary | ICD-10-CM | POA: Diagnosis not present

## 2018-04-27 DIAGNOSIS — D649 Anemia, unspecified: Secondary | ICD-10-CM | POA: Diagnosis not present

## 2018-04-27 DIAGNOSIS — N390 Urinary tract infection, site not specified: Secondary | ICD-10-CM | POA: Diagnosis not present

## 2018-04-27 DIAGNOSIS — R531 Weakness: Secondary | ICD-10-CM | POA: Diagnosis not present

## 2018-04-27 DIAGNOSIS — Z9181 History of falling: Secondary | ICD-10-CM | POA: Diagnosis not present

## 2018-04-27 DIAGNOSIS — Z794 Long term (current) use of insulin: Secondary | ICD-10-CM | POA: Diagnosis not present

## 2018-04-27 DIAGNOSIS — I10 Essential (primary) hypertension: Secondary | ICD-10-CM | POA: Diagnosis not present

## 2018-04-27 DIAGNOSIS — E119 Type 2 diabetes mellitus without complications: Secondary | ICD-10-CM | POA: Diagnosis not present

## 2018-04-28 ENCOUNTER — Other Ambulatory Visit: Payer: Medicare Other | Admitting: Internal Medicine

## 2018-04-28 DIAGNOSIS — R531 Weakness: Secondary | ICD-10-CM

## 2018-04-28 DIAGNOSIS — F0390 Unspecified dementia without behavioral disturbance: Secondary | ICD-10-CM

## 2018-04-28 DIAGNOSIS — Z9181 History of falling: Secondary | ICD-10-CM | POA: Diagnosis not present

## 2018-04-28 DIAGNOSIS — E119 Type 2 diabetes mellitus without complications: Secondary | ICD-10-CM | POA: Diagnosis not present

## 2018-04-28 DIAGNOSIS — I10 Essential (primary) hypertension: Secondary | ICD-10-CM | POA: Diagnosis not present

## 2018-04-28 DIAGNOSIS — D649 Anemia, unspecified: Secondary | ICD-10-CM | POA: Diagnosis not present

## 2018-04-28 DIAGNOSIS — M25569 Pain in unspecified knee: Secondary | ICD-10-CM

## 2018-04-28 DIAGNOSIS — M25469 Effusion, unspecified knee: Secondary | ICD-10-CM

## 2018-04-28 DIAGNOSIS — R269 Unspecified abnormalities of gait and mobility: Secondary | ICD-10-CM

## 2018-04-28 DIAGNOSIS — N390 Urinary tract infection, site not specified: Secondary | ICD-10-CM | POA: Diagnosis not present

## 2018-04-28 DIAGNOSIS — Z794 Long term (current) use of insulin: Secondary | ICD-10-CM | POA: Diagnosis not present

## 2018-04-29 DIAGNOSIS — R531 Weakness: Secondary | ICD-10-CM | POA: Diagnosis not present

## 2018-04-29 DIAGNOSIS — E119 Type 2 diabetes mellitus without complications: Secondary | ICD-10-CM | POA: Diagnosis not present

## 2018-04-29 DIAGNOSIS — Z9181 History of falling: Secondary | ICD-10-CM | POA: Diagnosis not present

## 2018-04-29 DIAGNOSIS — N390 Urinary tract infection, site not specified: Secondary | ICD-10-CM | POA: Diagnosis not present

## 2018-04-29 DIAGNOSIS — I10 Essential (primary) hypertension: Secondary | ICD-10-CM | POA: Diagnosis not present

## 2018-04-29 DIAGNOSIS — D649 Anemia, unspecified: Secondary | ICD-10-CM | POA: Diagnosis not present

## 2018-04-29 DIAGNOSIS — Z794 Long term (current) use of insulin: Secondary | ICD-10-CM | POA: Diagnosis not present

## 2018-05-03 DIAGNOSIS — Z794 Long term (current) use of insulin: Secondary | ICD-10-CM | POA: Diagnosis not present

## 2018-05-03 DIAGNOSIS — Z9181 History of falling: Secondary | ICD-10-CM | POA: Diagnosis not present

## 2018-05-03 DIAGNOSIS — R531 Weakness: Secondary | ICD-10-CM | POA: Diagnosis not present

## 2018-05-03 DIAGNOSIS — N390 Urinary tract infection, site not specified: Secondary | ICD-10-CM | POA: Diagnosis not present

## 2018-05-03 DIAGNOSIS — I10 Essential (primary) hypertension: Secondary | ICD-10-CM | POA: Diagnosis not present

## 2018-05-03 DIAGNOSIS — E119 Type 2 diabetes mellitus without complications: Secondary | ICD-10-CM | POA: Diagnosis not present

## 2018-05-03 DIAGNOSIS — D649 Anemia, unspecified: Secondary | ICD-10-CM | POA: Diagnosis not present

## 2018-05-04 DIAGNOSIS — M25469 Effusion, unspecified knee: Secondary | ICD-10-CM | POA: Insufficient documentation

## 2018-05-04 DIAGNOSIS — R269 Unspecified abnormalities of gait and mobility: Secondary | ICD-10-CM | POA: Insufficient documentation

## 2018-05-04 DIAGNOSIS — M25569 Pain in unspecified knee: Secondary | ICD-10-CM

## 2018-05-04 DIAGNOSIS — R531 Weakness: Secondary | ICD-10-CM | POA: Insufficient documentation

## 2018-05-04 NOTE — Progress Notes (Signed)
PALLIATIVE CARE CONSULT VISIT   PATIENT NAME: Diana Hartman DOB: 1941-06-29 MRN: [id number]  PRIMARY CARE PROVIDER:   Dr. Pearson Grippe  REFERRING PROVIDER:    Dr. Berneice Heinrich, MD [address] [place], [place] [address]  RESPONSIBLE PARTY:    Diana Hartman(daughter)  [phone]    RECOMMENDATIONS and PLAN:  1. Dementia without behavioral disturbance  F03.90:  FAST stage 7c. Intermittently able to feed self finger foods.  Requires full-time caregiver attendance.  Continue to monitor  2. Gait disturbance R26.9:   Requires rolling walker and additional physical assistance.  Receiving PT for gait training and transfers.  3. Generalized weakness R 53.1:  Chronic state.  Supportive physical care.  Provide safe environment.  Expect additional functional decline in the future.  4.         Pain and swelling of knees  M25.569:  Chronic and limits ambulation.  Unable to determine level of pain due to cognitive decline.  Tylenol  q AM (q PM prn) and topical analgesics recommended. Gentle ROM if tolerated.  Continue to monitor.  4. Advanced care planning: DNAR  Goals of care are for pt. to remain in home as long as possible with caregiving provided by her children. Transition to Hospice care when additional cognitive and functional decline occurs.  Palliative care will continue to follow.    I spent 40 minutes providing this consultation at home (from 2:00PM to 2:40PM.). More than 50% of the time in this consultation was spent coordinating communication and explaining the findings to the patient's son.   HISTORY OF PRESENT ILLNESS:  Routine follow-up with Diana Hartman finds that she has been without falls, illness or hospitalization since last visit.  Son reports that her appetite is still sufficient and hydration remains a challenge.  She remains total care and is currently receiving in home PT.  Ambulation ability varies but she always requires assistance  with same.  No reports of abnormal behaviors.    CODE STATUS: DNR/DNI  PPS: 40% HOSPICE ELIGIBILITY/DIAGNOSIS: TBD  PAST MEDICAL HISTORY:  Past Medical History:  Diagnosis Date  . Dementia   . Diabetes mellitus   . Hypertension   . Temporal arteritis (HCC)     SOCIAL HX:  Social History   Tobacco Use  . Smoking status: Never Smoker  . Smokeless tobacco: Never Used  Substance Use Topics  . Alcohol use: No    ALLERGIES: No Known Allergies   PERTINENT MEDICATIONS:  Outpatient Encounter Medications as of 04/28/2018  Medication Sig  . acetaminophen (TYLENOL) 325 MG tablet Take 2 tablets (650 mg total) by mouth every 6 (six) hours as needed for mild pain (or Fever >/= 101).  Marland Kitchen diltiazem (CARDIZEM) 120 MG tablet Take 120 mg by mouth 4 (four) times daily.  Marland Kitchen donepezil (ARICEPT) 10 MG tablet Take 10 mg by mouth daily.  Marland Kitchen GLUCERNA (GLUCERNA) LIQD Take 237 mLs by mouth 2 (two) times daily between meals. (Patient not taking: Reported on 10/01/2017)  . insulin detemir (LEVEMIR) 100 UNIT/ML injection Inject 0.1 mLs (10 Units total) into the skin 2 (two) times daily.  . Iron-Vitamins (GERITOL) LIQD Take 5 mLs by mouth daily.  Marland Kitchen lisinopril (PRINIVIL,ZESTRIL) 40 MG tablet Take 40 mg by mouth daily.    . metFORMIN (GLUCOPHAGE) 1000 MG tablet Take 1,000 mg by mouth daily with breakfast.  . nebivolol (BYSTOLIC) 5 MG tablet Take 5 mg by mouth daily.  . sertraline (ZOLOFT) 50  MG tablet Take 50 mg by mouth at bedtime.   . traMADol (ULTRAM) 50 MG tablet Take 1 tablet (50 mg total) by mouth every 6 (six) hours as needed.  . zolpidem (AMBIEN) 10 MG tablet Take 10 mg by mouth at bedtime.    No facility-administered encounter medications on file as of 04/28/2018.     PHYSICAL EXAM:   General: NAD, frail appearing elderly female sitting in chair Cardiovascular: regular rate and rhythm Pulmonary: clear throughout Abdomen: soft, nontender, + bowel sounds Extremities: edema of knees with limited  extension. Pt grabbed my hand during gentle manipulation of the R knee.  No fluctuance, increased warmth or erythema. Skin: exposed skin is intact Neurological: Alert.  Unable to determine orientation due to cognitive decline. Speaks in 1-2 words and nods.  Does not follow commands. Generalized weakness. Psych:  Calm and selectively cooperative.  Smiles  Diana Sheffield, NP-C

## 2018-05-05 DIAGNOSIS — N390 Urinary tract infection, site not specified: Secondary | ICD-10-CM | POA: Diagnosis not present

## 2018-05-05 DIAGNOSIS — E119 Type 2 diabetes mellitus without complications: Secondary | ICD-10-CM | POA: Diagnosis not present

## 2018-05-05 DIAGNOSIS — Z794 Long term (current) use of insulin: Secondary | ICD-10-CM | POA: Diagnosis not present

## 2018-05-05 DIAGNOSIS — R531 Weakness: Secondary | ICD-10-CM | POA: Diagnosis not present

## 2018-05-05 DIAGNOSIS — Z9181 History of falling: Secondary | ICD-10-CM | POA: Diagnosis not present

## 2018-05-05 DIAGNOSIS — I10 Essential (primary) hypertension: Secondary | ICD-10-CM | POA: Diagnosis not present

## 2018-05-05 DIAGNOSIS — D649 Anemia, unspecified: Secondary | ICD-10-CM | POA: Diagnosis not present

## 2018-05-06 DIAGNOSIS — I10 Essential (primary) hypertension: Secondary | ICD-10-CM | POA: Diagnosis not present

## 2018-05-06 DIAGNOSIS — E119 Type 2 diabetes mellitus without complications: Secondary | ICD-10-CM | POA: Diagnosis not present

## 2018-05-06 DIAGNOSIS — Z9181 History of falling: Secondary | ICD-10-CM | POA: Diagnosis not present

## 2018-05-06 DIAGNOSIS — R531 Weakness: Secondary | ICD-10-CM | POA: Diagnosis not present

## 2018-05-06 DIAGNOSIS — Z794 Long term (current) use of insulin: Secondary | ICD-10-CM | POA: Diagnosis not present

## 2018-05-06 DIAGNOSIS — D649 Anemia, unspecified: Secondary | ICD-10-CM | POA: Diagnosis not present

## 2018-05-06 DIAGNOSIS — N390 Urinary tract infection, site not specified: Secondary | ICD-10-CM | POA: Diagnosis not present

## 2018-05-09 DIAGNOSIS — Z9181 History of falling: Secondary | ICD-10-CM | POA: Diagnosis not present

## 2018-05-09 DIAGNOSIS — Z794 Long term (current) use of insulin: Secondary | ICD-10-CM | POA: Diagnosis not present

## 2018-05-09 DIAGNOSIS — R531 Weakness: Secondary | ICD-10-CM | POA: Diagnosis not present

## 2018-05-09 DIAGNOSIS — E119 Type 2 diabetes mellitus without complications: Secondary | ICD-10-CM | POA: Diagnosis not present

## 2018-05-09 DIAGNOSIS — D649 Anemia, unspecified: Secondary | ICD-10-CM | POA: Diagnosis not present

## 2018-05-09 DIAGNOSIS — I10 Essential (primary) hypertension: Secondary | ICD-10-CM | POA: Diagnosis not present

## 2018-05-09 DIAGNOSIS — N390 Urinary tract infection, site not specified: Secondary | ICD-10-CM | POA: Diagnosis not present

## 2018-05-10 DIAGNOSIS — D649 Anemia, unspecified: Secondary | ICD-10-CM | POA: Diagnosis not present

## 2018-05-10 DIAGNOSIS — Z794 Long term (current) use of insulin: Secondary | ICD-10-CM | POA: Diagnosis not present

## 2018-05-10 DIAGNOSIS — R531 Weakness: Secondary | ICD-10-CM | POA: Diagnosis not present

## 2018-05-10 DIAGNOSIS — E119 Type 2 diabetes mellitus without complications: Secondary | ICD-10-CM | POA: Diagnosis not present

## 2018-05-10 DIAGNOSIS — I10 Essential (primary) hypertension: Secondary | ICD-10-CM | POA: Diagnosis not present

## 2018-05-10 DIAGNOSIS — Z9181 History of falling: Secondary | ICD-10-CM | POA: Diagnosis not present

## 2018-05-10 DIAGNOSIS — N390 Urinary tract infection, site not specified: Secondary | ICD-10-CM | POA: Diagnosis not present

## 2018-05-13 DIAGNOSIS — E119 Type 2 diabetes mellitus without complications: Secondary | ICD-10-CM | POA: Diagnosis not present

## 2018-05-13 DIAGNOSIS — I10 Essential (primary) hypertension: Secondary | ICD-10-CM | POA: Diagnosis not present

## 2018-05-13 DIAGNOSIS — Z9181 History of falling: Secondary | ICD-10-CM | POA: Diagnosis not present

## 2018-05-13 DIAGNOSIS — R531 Weakness: Secondary | ICD-10-CM | POA: Diagnosis not present

## 2018-05-13 DIAGNOSIS — D649 Anemia, unspecified: Secondary | ICD-10-CM | POA: Diagnosis not present

## 2018-05-13 DIAGNOSIS — N390 Urinary tract infection, site not specified: Secondary | ICD-10-CM | POA: Diagnosis not present

## 2018-05-13 DIAGNOSIS — Z794 Long term (current) use of insulin: Secondary | ICD-10-CM | POA: Diagnosis not present

## 2018-05-17 DIAGNOSIS — N390 Urinary tract infection, site not specified: Secondary | ICD-10-CM | POA: Diagnosis not present

## 2018-05-17 DIAGNOSIS — R531 Weakness: Secondary | ICD-10-CM | POA: Diagnosis not present

## 2018-05-17 DIAGNOSIS — I10 Essential (primary) hypertension: Secondary | ICD-10-CM | POA: Diagnosis not present

## 2018-05-17 DIAGNOSIS — Z9181 History of falling: Secondary | ICD-10-CM | POA: Diagnosis not present

## 2018-05-17 DIAGNOSIS — E119 Type 2 diabetes mellitus without complications: Secondary | ICD-10-CM | POA: Diagnosis not present

## 2018-05-17 DIAGNOSIS — D649 Anemia, unspecified: Secondary | ICD-10-CM | POA: Diagnosis not present

## 2018-05-17 DIAGNOSIS — Z794 Long term (current) use of insulin: Secondary | ICD-10-CM | POA: Diagnosis not present

## 2018-05-20 DIAGNOSIS — D649 Anemia, unspecified: Secondary | ICD-10-CM | POA: Diagnosis not present

## 2018-05-20 DIAGNOSIS — N390 Urinary tract infection, site not specified: Secondary | ICD-10-CM | POA: Diagnosis not present

## 2018-05-20 DIAGNOSIS — Z9181 History of falling: Secondary | ICD-10-CM | POA: Diagnosis not present

## 2018-05-20 DIAGNOSIS — R531 Weakness: Secondary | ICD-10-CM | POA: Diagnosis not present

## 2018-05-20 DIAGNOSIS — Z794 Long term (current) use of insulin: Secondary | ICD-10-CM | POA: Diagnosis not present

## 2018-05-20 DIAGNOSIS — E119 Type 2 diabetes mellitus without complications: Secondary | ICD-10-CM | POA: Diagnosis not present

## 2018-05-20 DIAGNOSIS — I10 Essential (primary) hypertension: Secondary | ICD-10-CM | POA: Diagnosis not present

## 2018-06-02 ENCOUNTER — Other Ambulatory Visit: Payer: Medicare Other | Admitting: Internal Medicine

## 2018-06-02 DIAGNOSIS — F0390 Unspecified dementia without behavioral disturbance: Secondary | ICD-10-CM

## 2018-06-02 DIAGNOSIS — R269 Unspecified abnormalities of gait and mobility: Secondary | ICD-10-CM

## 2018-06-02 DIAGNOSIS — M25569 Pain in unspecified knee: Secondary | ICD-10-CM

## 2018-06-02 DIAGNOSIS — M25469 Effusion, unspecified knee: Secondary | ICD-10-CM

## 2018-06-02 DIAGNOSIS — R531 Weakness: Secondary | ICD-10-CM

## 2018-06-02 NOTE — Progress Notes (Signed)
PALLIATIVE CARE CONSULT VISIT   PATIENT NAME: Diana Hartman DOB: [birth year] MRN: [id number]  PRIMARY CARE PROVIDER:   Dr. Pearson Grippe  REFERRING PROVIDER:    Dr. Pearson Grippe [address] [place], [place] [address]    RESPONSIBLE PARTY:    [name](daughter)  [phone]    RECOMMENDATIONS and PLAN:  1. Dementia without behavioral disturbance  F03.90:  FAST stage 7c with less speech  Requires full-time caregiver attendance.  Continue supportive care  2. Gait disturbance R26.9:   Unchanged. Rollator and physical assistance.   3. Generalized weakness R 53.1:  Chronic state.    Expect additional functional decline in the future in light of progressive dementia.  4.         Pain and swelling of knees  M25.569: Resolved  5. Advanced care planning: DNAR  Goals of care are for pt. to remain in home as long as possible with caregiving provided by her children. Transition to Hospice care when additional cognitive and functional decline occurs.  Palliative care will continue to follow.    I spent 40 minutes providing this consultation at home (from 4:00PM to 4:20PM.). More than 50% of the time in this consultation was spent coordinating communication and explaining the findings to the patient's son.   HISTORY OF PRESENT ILLNESS: Follow-up with [name].  Son/caregiver reports no acute illnesses, falls or hospitalizations since last visit.  She continues to eat well and normally feeds self finger foods with cueing.  No complaints today.    CODE STATUS: DNR/DNI  PPS: 40% HOSPICE ELIGIBILITY/DIAGNOSIS: TBD  PAST MEDICAL HISTORY:  Past Medical History:  Diagnosis Date  . Dementia   . Diabetes mellitus   . Hypertension   . Temporal arteritis (HCC)     SOCIAL HX:  Social History   Tobacco Use  . Smoking status: Never Smoker  . Smokeless tobacco: Never Used  Substance Use Topics  . Alcohol use: No    ALLERGIES: No Known Allergies   PERTINENT  MEDICATIONS:  Outpatient Encounter Medications as of 06/02/2018  Medication Sig  . acetaminophen (TYLENOL) 325 MG tablet Take 2 tablets (650 mg total) by mouth every 6 (six) hours as needed for mild pain (or Fever >/= 101).  Marland Kitchen diltiazem (CARDIZEM) 120 MG tablet Take 120 mg by mouth 4 (four) times daily.  Marland Kitchen donepezil (ARICEPT) 10 MG tablet Take 10 mg by mouth daily.  Marland Kitchen GLUCERNA (GLUCERNA) LIQD Take 237 mLs by mouth 2 (two) times daily between meals. (Patient not taking: Reported on 10/01/2017)  . insulin detemir (LEVEMIR) 100 UNIT/ML injection Inject 0.1 mLs (10 Units total) into the skin 2 (two) times daily.  . Iron-Vitamins (GERITOL) LIQD Take 5 mLs by mouth daily.  Marland Kitchen lisinopril (PRINIVIL,ZESTRIL) 40 MG tablet Take 40 mg by mouth daily.    . metFORMIN (GLUCOPHAGE) 1000 MG tablet Take 1,000 mg by mouth daily with breakfast.  . nebivolol (BYSTOLIC) 5 MG tablet Take 5 mg by mouth daily.  . sertraline (ZOLOFT) 50 MG tablet Take 50 mg by mouth at bedtime.   . traMADol (ULTRAM) 50 MG tablet Take 1 tablet (50 mg total) by mouth every 6 (six) hours as needed.  . zolpidem (AMBIEN) 10 MG tablet Take 10 mg by mouth at bedtime.    No facility-administered encounter medications on file as of 06/02/2018.     PHYSICAL EXAM:   General: NAD, frail appearing elderly female sitting in chair Cardiovascular: regular rate and rhythm Pulmonary: clear  throughout Abdomen: soft, nontender, + bowel sounds Extremities: Mild edema of knees with decreased extension. No tenderness Skin: exposed skin is intact Neurological: Alert.  Unable to determine orientation due to cognitive decline. Non-verbal  Does not follow commands. Generalized weakness. Psych:  Calm,  Smiles  Margaretha Sheffield, NP-C

## 2018-06-28 ENCOUNTER — Inpatient Hospital Stay (HOSPITAL_COMMUNITY)
Admission: EM | Admit: 2018-06-28 | Discharge: 2018-07-05 | DRG: 065 | Disposition: A | Discharge status: Skilled Nursing Facility | Payer: Medicare Other | Attending: Internal Medicine | Admitting: Internal Medicine

## 2018-06-28 ENCOUNTER — Inpatient Hospital Stay (HOSPITAL_COMMUNITY): Payer: Medicare Other

## 2018-06-28 ENCOUNTER — Emergency Department (HOSPITAL_COMMUNITY): Payer: Medicare Other

## 2018-06-28 ENCOUNTER — Encounter (HOSPITAL_COMMUNITY): Payer: Self-pay | Admitting: Emergency Medicine

## 2018-06-28 DIAGNOSIS — Z8673 Personal history of transient ischemic attack (TIA), and cerebral infarction without residual deficits: Secondary | ICD-10-CM | POA: Diagnosis not present

## 2018-06-28 DIAGNOSIS — E1151 Type 2 diabetes mellitus with diabetic peripheral angiopathy without gangrene: Secondary | ICD-10-CM | POA: Diagnosis not present

## 2018-06-28 DIAGNOSIS — R531 Weakness: Secondary | ICD-10-CM | POA: Diagnosis not present

## 2018-06-28 DIAGNOSIS — E11649 Type 2 diabetes mellitus with hypoglycemia without coma: Secondary | ICD-10-CM | POA: Diagnosis not present

## 2018-06-28 DIAGNOSIS — M2550 Pain in unspecified joint: Secondary | ICD-10-CM | POA: Diagnosis not present

## 2018-06-28 DIAGNOSIS — I69351 Hemiplegia and hemiparesis following cerebral infarction affecting right dominant side: Secondary | ICD-10-CM | POA: Diagnosis not present

## 2018-06-28 DIAGNOSIS — N390 Urinary tract infection, site not specified: Secondary | ICD-10-CM | POA: Diagnosis not present

## 2018-06-28 DIAGNOSIS — G8191 Hemiplegia, unspecified affecting right dominant side: Secondary | ICD-10-CM | POA: Diagnosis not present

## 2018-06-28 DIAGNOSIS — G309 Alzheimer's disease, unspecified: Secondary | ICD-10-CM

## 2018-06-28 DIAGNOSIS — Z794 Long term (current) use of insulin: Secondary | ICD-10-CM

## 2018-06-28 DIAGNOSIS — R488 Other symbolic dysfunctions: Secondary | ICD-10-CM | POA: Diagnosis not present

## 2018-06-28 DIAGNOSIS — F0390 Unspecified dementia without behavioral disturbance: Secondary | ICD-10-CM | POA: Diagnosis present

## 2018-06-28 DIAGNOSIS — R1312 Dysphagia, oropharyngeal phase: Secondary | ICD-10-CM | POA: Diagnosis not present

## 2018-06-28 DIAGNOSIS — R262 Difficulty in walking, not elsewhere classified: Secondary | ICD-10-CM

## 2018-06-28 DIAGNOSIS — E119 Type 2 diabetes mellitus without complications: Secondary | ICD-10-CM

## 2018-06-28 DIAGNOSIS — Z833 Family history of diabetes mellitus: Secondary | ICD-10-CM

## 2018-06-28 DIAGNOSIS — I639 Cerebral infarction, unspecified: Secondary | ICD-10-CM | POA: Diagnosis not present

## 2018-06-28 DIAGNOSIS — B9620 Unspecified Escherichia coli [E. coli] as the cause of diseases classified elsewhere: Secondary | ICD-10-CM | POA: Diagnosis not present

## 2018-06-28 DIAGNOSIS — E1149 Type 2 diabetes mellitus with other diabetic neurological complication: Secondary | ICD-10-CM

## 2018-06-28 DIAGNOSIS — Z993 Dependence on wheelchair: Secondary | ICD-10-CM

## 2018-06-28 DIAGNOSIS — E46 Unspecified protein-calorie malnutrition: Secondary | ICD-10-CM | POA: Diagnosis not present

## 2018-06-28 DIAGNOSIS — I69320 Aphasia following cerebral infarction: Secondary | ICD-10-CM | POA: Diagnosis not present

## 2018-06-28 DIAGNOSIS — R4701 Aphasia: Secondary | ICD-10-CM | POA: Diagnosis not present

## 2018-06-28 DIAGNOSIS — E785 Hyperlipidemia, unspecified: Secondary | ICD-10-CM | POA: Diagnosis present

## 2018-06-28 DIAGNOSIS — D62 Acute posthemorrhagic anemia: Secondary | ICD-10-CM | POA: Diagnosis not present

## 2018-06-28 DIAGNOSIS — Z7401 Bed confinement status: Secondary | ICD-10-CM | POA: Diagnosis not present

## 2018-06-28 DIAGNOSIS — R4781 Slurred speech: Secondary | ICD-10-CM | POA: Diagnosis not present

## 2018-06-28 DIAGNOSIS — D696 Thrombocytopenia, unspecified: Secondary | ICD-10-CM | POA: Diagnosis not present

## 2018-06-28 DIAGNOSIS — D649 Anemia, unspecified: Secondary | ICD-10-CM | POA: Diagnosis not present

## 2018-06-28 DIAGNOSIS — I10 Essential (primary) hypertension: Secondary | ICD-10-CM

## 2018-06-28 DIAGNOSIS — Z8249 Family history of ischemic heart disease and other diseases of the circulatory system: Secondary | ICD-10-CM | POA: Diagnosis not present

## 2018-06-28 DIAGNOSIS — M6281 Muscle weakness (generalized): Secondary | ICD-10-CM | POA: Diagnosis not present

## 2018-06-28 DIAGNOSIS — F32A Depression, unspecified: Secondary | ICD-10-CM

## 2018-06-28 DIAGNOSIS — R29810 Facial weakness: Secondary | ICD-10-CM | POA: Diagnosis not present

## 2018-06-28 DIAGNOSIS — I6340 Cerebral infarction due to embolism of unspecified cerebral artery: Secondary | ICD-10-CM | POA: Diagnosis not present

## 2018-06-28 DIAGNOSIS — R001 Bradycardia, unspecified: Secondary | ICD-10-CM

## 2018-06-28 DIAGNOSIS — F0280 Dementia in other diseases classified elsewhere without behavioral disturbance: Secondary | ICD-10-CM | POA: Diagnosis not present

## 2018-06-28 DIAGNOSIS — F329 Major depressive disorder, single episode, unspecified: Secondary | ICD-10-CM | POA: Diagnosis present

## 2018-06-28 DIAGNOSIS — R2689 Other abnormalities of gait and mobility: Secondary | ICD-10-CM | POA: Diagnosis not present

## 2018-06-28 DIAGNOSIS — I361 Nonrheumatic tricuspid (valve) insufficiency: Secondary | ICD-10-CM | POA: Diagnosis not present

## 2018-06-28 DIAGNOSIS — R278 Other lack of coordination: Secondary | ICD-10-CM | POA: Diagnosis not present

## 2018-06-28 DIAGNOSIS — R404 Transient alteration of awareness: Secondary | ICD-10-CM | POA: Diagnosis not present

## 2018-06-28 LAB — COMPREHENSIVE METABOLIC PANEL
ALT: 18 U/L (ref 0–44)
AST: 32 U/L (ref 15–41)
Albumin: 2.9 g/dL — ABNORMAL LOW (ref 3.5–5.0)
Alkaline Phosphatase: 115 U/L (ref 38–126)
Anion gap: 6 (ref 5–15)
BUN: 8 mg/dL (ref 8–23)
CHLORIDE: 105 mmol/L (ref 98–111)
CO2: 28 mmol/L (ref 22–32)
CREATININE: 0.78 mg/dL (ref 0.44–1.00)
Calcium: 9.1 mg/dL (ref 8.9–10.3)
Glucose, Bld: 69 mg/dL — ABNORMAL LOW (ref 70–99)
Potassium: 4 mmol/L (ref 3.5–5.1)
SODIUM: 139 mmol/L (ref 135–145)
Total Bilirubin: 0.6 mg/dL (ref 0.3–1.2)
Total Protein: 7.7 g/dL (ref 6.5–8.1)

## 2018-06-28 LAB — LIPID PANEL
CHOL/HDL RATIO: 3.2 ratio
Cholesterol: 146 mg/dL (ref 0–200)
HDL: 46 mg/dL (ref >40)
LDL Cholesterol: 86 mg/dL (ref 0–99)
Triglycerides: 72 mg/dL (ref <150)
VLDL: 14 mg/dL (ref 0–40)

## 2018-06-28 LAB — DIFFERENTIAL
ABS IMMATURE GRANULOCYTES: 0 10*3/uL (ref 0.0–0.1)
BASOS ABS: 0 10*3/uL (ref 0.0–0.1)
BASOS PCT: 1 %
EOS ABS: 0.2 10*3/uL (ref 0.0–0.7)
Eosinophils Relative: 3 %
IMMATURE GRANULOCYTES: 0 %
Lymphocytes Relative: 42 %
Lymphs Abs: 2.6 10*3/uL (ref 0.7–4.0)
MONOS PCT: 12 %
Monocytes Absolute: 0.7 10*3/uL (ref 0.1–1.0)
NEUTROS ABS: 2.6 10*3/uL (ref 1.7–7.7)
NEUTROS PCT: 42 %

## 2018-06-28 LAB — GLUCOSE, CAPILLARY
GLUCOSE-CAPILLARY: 114 mg/dL — AB (ref 70–99)
GLUCOSE-CAPILLARY: 76 mg/dL (ref 70–99)

## 2018-06-28 LAB — I-STAT CHEM 8, ED
BUN: 8 mg/dL (ref 8–23)
CALCIUM ION: 1.13 mmol/L — AB (ref 1.15–1.40)
Chloride: 104 mmol/L (ref 98–111)
Creatinine, Ser: 0.7 mg/dL (ref 0.44–1.00)
Glucose, Bld: 68 mg/dL — ABNORMAL LOW (ref 70–99)
HEMATOCRIT: 36 % (ref 36.0–46.0)
Hemoglobin: 12.2 g/dL (ref 12.0–15.0)
POTASSIUM: 4 mmol/L (ref 3.5–5.1)
SODIUM: 140 mmol/L (ref 135–145)
TCO2: 28 mmol/L (ref 22–32)

## 2018-06-28 LAB — PROTIME-INR
INR: 1.14
PROTHROMBIN TIME: 14.5 s (ref 11.4–15.2)

## 2018-06-28 LAB — HEMOGLOBIN A1C
HEMOGLOBIN A1C: 6.3 % — AB (ref 4.8–5.6)
MEAN PLASMA GLUCOSE: 134.11 mg/dL

## 2018-06-28 LAB — CBC
HCT: 36.1 % (ref 36.0–46.0)
HEMOGLOBIN: 11.4 g/dL — AB (ref 12.0–15.0)
MCH: 29.2 pg (ref 26.0–34.0)
MCHC: 31.6 g/dL (ref 30.0–36.0)
MCV: 92.6 fL (ref 78.0–100.0)
Platelets: 160 10*3/uL (ref 150–400)
RBC: 3.9 MIL/uL (ref 3.87–5.11)
RDW: 14.7 % (ref 11.5–15.5)
WBC: 6.1 10*3/uL (ref 4.0–10.5)

## 2018-06-28 LAB — I-STAT TROPONIN, ED: TROPONIN I, POC: 0 ng/mL (ref 0.00–0.08)

## 2018-06-28 LAB — CBG MONITORING, ED
GLUCOSE-CAPILLARY: 84 mg/dL (ref 70–99)
GLUCOSE-CAPILLARY: 71 mg/dL (ref 70–99)
Glucose-Capillary: 65 mg/dL — ABNORMAL LOW (ref 70–99)
Glucose-Capillary: 125 mg/dL — ABNORMAL HIGH (ref 70–99)

## 2018-06-28 LAB — APTT: APTT: 27 s (ref 24–36)

## 2018-06-28 MED ORDER — ENOXAPARIN SODIUM 40 MG/0.4ML ~~LOC~~ SOLN
40.0000 mg | SUBCUTANEOUS | Status: DC
  Administered 2018-06-28 – 2018-07-04 (×7): 40 mg via SUBCUTANEOUS
  Filled 2018-06-28 (×7): qty 0.4

## 2018-06-28 MED ORDER — ASPIRIN 300 MG RE SUPP
300.0000 mg | Freq: Every day | RECTAL | Status: DC
  Filled 2018-06-28: qty 1

## 2018-06-28 MED ORDER — GADOBENATE DIMEGLUMINE 529 MG/ML IV SOLN
14.0000 mL | Freq: Once | INTRAVENOUS | Status: AC
  Administered 2018-06-28: 14 mL via INTRAVENOUS

## 2018-06-28 MED ORDER — INSULIN ASPART 100 UNIT/ML ~~LOC~~ SOLN
0.0000 [IU] | SUBCUTANEOUS | Status: DC
  Administered 2018-06-29: 5 [IU] via SUBCUTANEOUS
  Administered 2018-06-29: 1 [IU] via SUBCUTANEOUS
  Administered 2018-06-29: 3 [IU] via SUBCUTANEOUS
  Administered 2018-06-29: 1 [IU] via SUBCUTANEOUS
  Administered 2018-06-30: 3 [IU] via SUBCUTANEOUS
  Administered 2018-06-30: 1 [IU] via SUBCUTANEOUS
  Administered 2018-06-30 (×2): 2 [IU] via SUBCUTANEOUS
  Administered 2018-07-01: 3 [IU] via SUBCUTANEOUS
  Administered 2018-07-01: 1 [IU] via SUBCUTANEOUS
  Administered 2018-07-01: 3 [IU] via SUBCUTANEOUS
  Administered 2018-07-02: 5 [IU] via SUBCUTANEOUS
  Administered 2018-07-02: 2 [IU] via SUBCUTANEOUS
  Administered 2018-07-02: 1 [IU] via SUBCUTANEOUS
  Administered 2018-07-03: 2 [IU] via SUBCUTANEOUS
  Administered 2018-07-03: 5 [IU] via SUBCUTANEOUS
  Administered 2018-07-03: 1 [IU] via SUBCUTANEOUS
  Administered 2018-07-03: 3 [IU] via SUBCUTANEOUS
  Administered 2018-07-03: 1 [IU] via SUBCUTANEOUS
  Administered 2018-07-04: 2 [IU] via SUBCUTANEOUS
  Administered 2018-07-04: 3 [IU] via SUBCUTANEOUS
  Administered 2018-07-04: 7 [IU] via SUBCUTANEOUS
  Administered 2018-07-04: 1 [IU] via SUBCUTANEOUS
  Administered 2018-07-04: 9 [IU] via SUBCUTANEOUS
  Administered 2018-07-04: 2 [IU] via SUBCUTANEOUS

## 2018-06-28 MED ORDER — DEXTROSE-NACL 5-0.45 % IV SOLN
INTRAVENOUS | Status: DC
  Administered 2018-06-28: 20:00:00 via INTRAVENOUS

## 2018-06-28 MED ORDER — DEXTROSE 50 % IV SOLN
25.0000 mL | Freq: Once | INTRAVENOUS | Status: AC
  Administered 2018-06-28: 25 mL via INTRAVENOUS
  Filled 2018-06-28: qty 50

## 2018-06-28 MED ORDER — WHITE PETROLATUM EX OINT
TOPICAL_OINTMENT | CUTANEOUS | Status: AC
  Administered 2018-06-28: 20:00:00
  Filled 2018-06-28: qty 28.35

## 2018-06-28 MED ORDER — HYDRALAZINE HCL 20 MG/ML IJ SOLN: 10.0000 mg | Freq: Four times a day (QID) | INTRAMUSCULAR | Status: DC | PRN

## 2018-06-28 NOTE — ED Triage Notes (Signed)
LSN 1700. Pt arrives via EMS. Pt started leaning to right with reported to right sided weakness, mumbled speech, right sided facial droop. Pt has alzheimer's and is bedbound. No hx of prior stroke. No blood thinners reported. BP 178/89, HR 54, 97% room air. CBG 81

## 2018-06-28 NOTE — Progress Notes (Signed)
Preliminary notes--Bilateral carotid duplex exam completed.  Antegrade vertebral flows bilaterally.  Vessels tortuosity noticed.  Patient is very combative during the exam. Hongying Richardson DoppCole (RDMS RVT) 06/28/18 4:03 PM

## 2018-06-28 NOTE — ED Notes (Signed)
Pt transported to MRI 

## 2018-06-28 NOTE — ED Provider Notes (Signed)
[facility] EMERGENCY DEPARTMENT Provider Note   CSN: [id number] Arrival date & time: 06/28/18  1021     History   Chief Complaint Chief Complaint  Patient presents with  . Stroke Symptoms    HPI Diana Hartman is a 77 y.o. female.  HPI Patient with history of dementia and wheelchair bound normally able to transfer with 1 person assist.  Lives with family.  Normally conversant.  Family noticed patient's words did not not make sense and was leaning to the right.  Noticed right-sided weakness.  Symptoms started roughly at 5 PM yesterday.  Patient is unable to verbally contribute to history.  Level 5 caveat applies. Past Medical History:  Diagnosis Date  . Dementia   . Diabetes mellitus   . Hypertension   . Temporal arteritis [facility])     Patient Active Problem List   Diagnosis Date Noted  . CVA (cerebral vascular accident) (HCC) 06/28/2018  . Gait disturbance 05/04/2018  . General weakness 05/04/2018  . Pain and swelling of knee 05/04/2018  . Protein-calorie malnutrition, severe 05/18/2016  . Lower urinary tract infectious disease 05/17/2016  . Hyperglycemia 05/17/2016  . Hypernatremia 05/17/2016  . Dehydration 05/17/2016  . Acute encephalopathy 05/17/2016  . Acute cystitis without hematuria   . Hyperglycemia without ketosis   . Insulin dependent diabetes mellitus (HCC)   . Hypoglycemia 07/04/2015  . Diabetes mellitus, type II (HCC) 07/04/2015  . Essential hypertension 07/04/2015  . Dementia without behavioral disturbance 07/04/2015  . Fall 07/04/2015    Past Surgical History:  Procedure Laterality Date  . NO PAST SURGERIES       OB History   None      Home Medications    Prior to Admission medications   Medication Sig Start Date End Date Taking? Authorizing Provider  acetaminophen (TYLENOL) 160 MG/5ML elixir Take 480 mg/kg by mouth every 4 (four) hours as needed for fever.   Yes [provider]  Calcium Carbonate-Vitamin D  (CALCIUM 600+D) 600-200 MG-UNIT TABS Take 1 tablet by mouth daily.   Yes [provider]  diltiazem (CARDIZEM) 120 MG tablet Take 120 mg by mouth 2 (two) times daily.    Yes [provider]  donepezil (ARICEPT) 10 MG tablet Take 10 mg by mouth daily.   Yes [provider]  glipiZIDE (GLUCOTROL) 10 MG tablet Take 10 mg by mouth daily. 06/14/18  Yes [provider]  GLUCERNA (GLUCERNA) LIQD Take 237 mLs by mouth 2 (two) times daily between meals. Patient taking differently: Take 237 mLs by mouth as needed.  05/19/16  Yes Regalado, Belkys A, MD  insulin detemir (LEVEMIR) 100 UNIT/ML injection Inject 0.1 mLs (10 Units total) into the skin 2 (two) times daily. 07/06/15  Yes Gherghe, Daylene Katayama, MD  Iron-Vitamins (GERITOL) LIQD Take 5 mLs by mouth every other day.    Yes [provider]  lisinopril (PRINIVIL,ZESTRIL) 40 MG tablet Take 40 mg by mouth daily.     Yes [provider]  nebivolol (BYSTOLIC) 5 MG tablet Take 5 mg by mouth daily.   Yes [provider]  sertraline (ZOLOFT) 50 MG tablet Take 50 mg by mouth at bedtime.    Yes [provider]  traMADol (ULTRAM) 50 MG tablet Take 1 tablet (50 mg total) by mouth every 6 (six) hours as needed. 12/29/15  Yes Eber Hong, MD  zolpidem (AMBIEN) 10 MG tablet Take 10 mg by mouth at bedtime as needed.  05/31/15  Yes [provider]  acetaminophen (TYLENOL) 325 MG tablet Take 2 tablets (650 mg total) by mouth every 6 (six) hours as needed for mild pain (or Fever >/= 101). Patient not taking: Reported on 06/28/2018 05/19/16   Alba Cory, MD    Family History Family History  Problem Relation Age of Onset  . Diabetes type II Other   . Hypertension Other     Social History Social History   Tobacco Use  . Smoking status: Never Smoker  . Smokeless tobacco: Never Used  Substance Use Topics  . Alcohol use: No  . Drug use: No     Allergies   Patient has no known  allergies.   Review of Systems Review of Systems  Unable to perform ROS: Patient nonverbal     Physical Exam Updated Vital Signs BP (!) 178/69   Pulse 61   Temp 99.4 F (37.4 C) (Oral)   Resp 17   SpO2 100%   Physical Exam  Constitutional: She appears well-developed and well-nourished. No distress.  HENT:  Head: Normocephalic and atraumatic.  No obvious head injuries.  Eyes: Pupils are equal, round, and reactive to light. EOM are normal.  Patient with left gaze preference  Neck: Normal range of motion. Neck supple.  No meningismus  Cardiovascular: Normal rate and regular rhythm. Exam reveals no gallop and no friction rub.  No murmur heard. Pulmonary/Chest: Effort normal and breath sounds normal. No stridor. No respiratory distress. She has no wheezes. She has no rales. She exhibits no tenderness.  Abdominal: Soft. Bowel sounds are normal. There is no tenderness. There is no rebound and no guarding.  Musculoskeletal: Normal range of motion. She exhibits no edema or tenderness.  Neurological: She is alert.  Patient is not speaking.  She is not following commands.  Good tone and some movement in left upper and left lower extremities.  Patient has minimal movement in the right hand and the right lower extremity appears to be flaccid.  She is slumped to the right.  Skin: Skin is warm and dry. Capillary refill takes less than 2 seconds. No rash noted. She is not diaphoretic. No erythema.  Nursing note and vitals reviewed.    ED Treatments / Results  Labs (all labs ordered are listed, but only abnormal results are displayed) Labs Reviewed  CBC - Abnormal; Notable for the following components:      Result Value   Hemoglobin 11.4 (*)    All other components within normal limits  COMPREHENSIVE METABOLIC PANEL - Abnormal; Notable for the following components:   Glucose, Bld 69 (*)    Albumin 2.9 (*)    All other components within normal limits  I-STAT CHEM 8, ED - Abnormal;  Notable for the following components:   Glucose, Bld 68 (*)    Calcium, Ion 1.13 (*)    All other components within normal limits  CBG MONITORING, ED - Abnormal; Notable for the following components:   Glucose-Capillary 65 (*)    All other components within normal limits  CBG MONITORING, ED - Abnormal; Notable for the following components:   Glucose-Capillary 125 (*)    All other components within normal limits  PROTIME-INR  APTT  DIFFERENTIAL  URINALYSIS, ROUTINE W REFLEX MICROSCOPIC  LIPID PANEL  HEMOGLOBIN A1C  I-STAT TROPONIN, ED  CBG MONITORING, ED    EKG EKG Interpretation  Date/Time:  Tuesday June 28 2018 10:32:57 EDT Ventricular Rate:  57 PR Interval:    QRS Duration: 92 QT Interval:  459  QTC Calculation: 447 R Axis:   32 Text Interpretation:  Sinus rhythm Low voltage, precordial leads Anteroseptal infarct, old Baseline wander in lead(s) II III aVF Confirmed by Loren RacerYelverton, Kenlee Maler ([id number]) on 06/28/2018 10:35:45 AM   Radiology Ct Head Wo Contrast  Result Date: 06/28/2018 CLINICAL DATA:  Focal neuro deficit, greater than 6 hours, stroke suspected. Left seen normal at 5 o'clock p.m. yesterday. Abnormal speech. Right-sided facial droop. EXAM: CT HEAD WITHOUT CONTRAST TECHNIQUE: Contiguous axial images were obtained from the base of the skull through the vertex without intravenous contrast. COMPARISON:  CT head without contrast 12/19/2015 and 07/04/2015. FINDINGS: Brain: A left frontal cortical infarct involves the left frontal operculum and inferior left frontal gyrus. This is new from the prior exam and suspected to be acute or subacute. No acute infarct is evident along the precentral gyrus. A smaller right anterior frontal infarct is present in a similar location, also new since the prior exam. Atrophy and white matter changes are otherwise stable. Ventricles are proportionate to the degree of atrophy. No significant extra-axial fluid collection is present. A posterior left  cerebellar infarct is new since the prior exam. The brain stem and cerebellum are otherwise unremarkable. Vascular: Atherosclerotic calcifications are present within the cavernous internal carotid arteries bilaterally. There is no hyperdense vessel. Skull: The calvarium is intact. No focal lytic or blastic lesions are present. Sinuses/Orbits: The paranasal sinuses and mastoid air cells are clear. Bilateral lens replacements are present. Globes and orbits are otherwise within normal limits. IMPRESSION: 1. Age indeterminate left frontal lobe infarct may be acute. This is new since the prior exam and hyperdense to CSF, suggesting acute/subacute time frame rather than chronic. This could correlate with the patient's acute symptoms. 2. Right frontal lobe infarct in a similar location is smaller than on the left. This is also new since the prior study. 3. Age indeterminate posterior left cerebellar infarct measures up to 1.6 cm. 4. Otherwise stable atrophy and white matter disease likely reflecting the sequela of chronic microvascular ischemia. Electronically Signed   By: Marin Robertshristopher  Mattern M.D.   On: 06/28/2018 12:12    Procedures Procedures (including critical care time)  Medications Ordered in ED Medications  aspirin suppository 300 mg (has no administration in time range)  dextrose 50 % solution 25 mL (25 mLs Intravenous Given 06/28/18 1105)     Initial Impression / Assessment and Plan / ED Course  I have reviewed the triage vital signs and the nursing notes.  Pertinent labs & imaging results that were available during my care of the patient were reviewed by me and considered in my medical decision making (see chart for details).     CT with evidence of stroke.  Discussed with hospitalist will see patient in the emergency department.  Will also consult neurology.  Final Clinical Impressions(s) / ED Diagnoses   Final diagnoses:  Ischemic stroke [facility](HCC)    ED Discharge Orders    None         Loren RacerYelverton, Hava Massingale, MD 06/28/18 1325

## 2018-06-28 NOTE — Progress Notes (Signed)
Patient arrived from ED at this time. Safety precautions and orders reviewed with patient. VSS. RN report to on coming nurse.   Sim BoastHavy, RN

## 2018-06-28 NOTE — ED Notes (Signed)
POC CBG 84

## 2018-06-28 NOTE — ED Notes (Signed)
Pt still in MRI, will transport upstairs after MRI

## 2018-06-28 NOTE — ED Notes (Signed)
[phone] Diana Hartman

## 2018-06-28 NOTE — H&P (Signed)
History and Physical  Diana Haringda B Maes ZOX:[id number] DOB: [birth year] DOA: 06/28/2018  Referring physician: Dr Ranae PalmsYelverton [facility]  [facility] Specialists: None Patient coming from: Home  Chief Complaint: Right sided weakness  HPI: Diana Hartman is a 77 y.o. female with medical history significant for dementia, hypertension, type 2 diabetes, chronic depression who [facility] right-sided weakness that started yesterday evening.  History is obtained from ED physician and her daughter Gwenyth BouillonDebra Pettit.  Last seen normal yesterday evening around 2000.  Family noted right upper extremity weakness and unable to use her right hand to feed herself (Lives with her daughter who is also her medical POA).  Symptoms were not improving and this morning her right-sided weakness persisted.  They also noted slurred speech.  And decided to call EMS.  ED Course: Upon presentation to the ED, CT head with no contrast revealed age indeterminate left frontal lobe infarct suggesting acute/subacute timeframe rather than chronic.  Also right frontal lobe infarct in a similar location smaller than the left.  And also age-indeterminate posterior left cerebellar infarct measuring up to 1.6 cm.  Discussed CODE STATUS with daughter who is also medical power of attorney stated her mother is a full code. Neurology consulted by ED physician.  Review of Systems: Review of systems as noted in the HPI. All other systems reviewed and are negative.   Past Medical History:  Diagnosis Date  . Dementia   . Diabetes mellitus   . Hypertension   . Temporal arteritis [facility](HCC)    Past Surgical History:  Procedure Laterality Date  . NO PAST SURGERIES      Social History:  reports that she has never smoked. She has never used smokeless tobacco. She reports that she does not drink alcohol or use drugs.   No Known Allergies  Family History  Problem Relation Age of Onset  . Diabetes type II Other    . Hypertension Other     Brother with prostate cancer Sister deceased with hypertension and type 2 diabetes  Prior to Admission medications   Medication Sig Start Date End Date Taking? Authorizing Provider  acetaminophen (TYLENOL) 160 MG/5ML elixir Take 480 mg/kg by mouth every 4 (four) hours as needed for fever.   Yes [provider]  Calcium Carbonate-Vitamin D (CALCIUM 600+D) 600-200 MG-UNIT TABS Take 1 tablet by mouth daily.   Yes [provider]  diltiazem (CARDIZEM) 120 MG tablet Take 120 mg by mouth 2 (two) times daily.    Yes [provider]  donepezil (ARICEPT) 10 MG tablet Take 10 mg by mouth daily.   Yes [provider]  glipiZIDE (GLUCOTROL) 10 MG tablet Take 10 mg by mouth daily. 06/14/18  Yes [provider]  GLUCERNA (GLUCERNA) LIQD Take 237 mLs by mouth 2 (two) times daily between meals. Patient taking differently: Take 237 mLs by mouth as needed.  05/19/16  Yes Regalado, Belkys A, MD  insulin detemir (LEVEMIR) 100 UNIT/ML injection Inject 0.1 mLs (10 Units total) into the skin 2 (two) times daily. 07/06/15  Yes Gherghe, Daylene Katayamaostin M, MD  Iron-Vitamins (GERITOL) LIQD Take 5 mLs by mouth every other day.    Yes [provider]  lisinopril (PRINIVIL,ZESTRIL) 40 MG tablet Take 40 mg by mouth daily.     Yes [provider]  nebivolol (BYSTOLIC) 5 MG tablet Take 5 mg by mouth daily.   Yes [provider]  sertraline (ZOLOFT) 50 MG tablet Take 50 mg by mouth at  bedtime.    Yes [provider]  traMADol (ULTRAM) 50 MG tablet Take 1 tablet (50 mg total) by mouth every 6 (six) hours as needed. 12/29/15  Yes Eber Hong, MD  zolpidem (AMBIEN) 10 MG tablet Take 10 mg by mouth at bedtime as needed.  05/31/15  Yes [provider]  acetaminophen (TYLENOL) 325 MG tablet Take 2 tablets (650 mg total) by mouth every 6 (six) hours as needed for mild pain (or Fever >/= 101). Patient not taking: Reported on  06/28/2018 05/19/16   Alba Cory, MD    Physical Exam: BP (!) 178/69   Pulse 61   Temp 99.4 F (37.4 C) (Oral)   Resp 17   SpO2 100%   . General: 77 y.o. year-old female frail in no acute distress.  Alert but nonverbal and not following commands.   . Cardiovascular: Regular rate and rhythm with no rubs or gallops.  No thyromegaly or JVD noted.  No lower extremity edema. 2/4 pulses in all 4 extremities. Marland Kitchen Respiratory: Clear to auscultation with no wheezes or rales. Poor inspiratory effort. . Abdomen: Soft nontender nondistended with normal bowel sounds x4 quadrants. . Muskuloskeletal: No cyanosis, clubbing or edema noted bilaterally . Neuro: Unable to complete a neuro exam due to not following commands.  Hypoactive reflexes.  Contractures noted in upper extremities bilaterally. . Skin: No ulcerative lesions noted or rashes noted. Marland Kitchen Psychiatry: Judgement and insight appear normal for present condition. Mood is appropriate for condition and setting.          Labs on Admission:  Basic Metabolic Panel: Recent Labs  Lab 06/28/18 1057 06/28/18 1106  NA 139 140  K 4.0 4.0  CL 105 104  CO2 28  --   GLUCOSE 69* 68*  BUN 8 8  CREATININE 0.78 0.70  CALCIUM 9.1  --    Liver Function Tests: Recent Labs  Lab 06/28/18 1057  AST 32  ALT 18  ALKPHOS 115  BILITOT 0.6  PROT 7.7  ALBUMIN 2.9*   No results for input(s): LIPASE, AMYLASE in the last 168 hours. No results for input(s): AMMONIA in the last 168 hours. CBC: Recent Labs  Lab 06/28/18 1057 06/28/18 1106  WBC 6.1  --   NEUTROABS 2.6  --   HGB 11.4* 12.2  HCT 36.1 36.0  MCV 92.6  --   PLT 160  --    Cardiac Enzymes: No results for input(s): CKTOTAL, CKMB, CKMBINDEX, TROPONINI in the last 168 hours.  BNP (last 3 results) No results for input(s): BNP in the last 8760 hours.  ProBNP (last 3 results) No results for input(s): PROBNP in the last 8760 hours.  CBG: Recent Labs  Lab 06/28/18 1037 06/28/18 1136  06/28/18 1234  GLUCAP 65* 125* 84    Radiological Exams on Admission: Ct Head Wo Contrast  Result Date: 06/28/2018 CLINICAL DATA:  Focal neuro deficit, greater than 6 hours, stroke suspected. Left seen normal at 5 o'clock p.m. yesterday. Abnormal speech. Right-sided facial droop. EXAM: CT HEAD WITHOUT CONTRAST TECHNIQUE: Contiguous axial images were obtained from the base of the skull through the vertex without intravenous contrast. COMPARISON:  CT head without contrast 12/19/2015 and 07/04/2015. FINDINGS: Brain: A left frontal cortical infarct involves the left frontal operculum and inferior left frontal gyrus. This is new from the prior exam and suspected to be acute or subacute. No acute infarct is evident along the precentral gyrus. A smaller right anterior frontal infarct is present in a similar location,  also new since the prior exam. Atrophy and white matter changes are otherwise stable. Ventricles are proportionate to the degree of atrophy. No significant extra-axial fluid collection is present. A posterior left cerebellar infarct is new since the prior exam. The brain stem and cerebellum are otherwise unremarkable. Vascular: Atherosclerotic calcifications are present within the cavernous internal carotid arteries bilaterally. There is no hyperdense vessel. Skull: The calvarium is intact. No focal lytic or blastic lesions are present. Sinuses/Orbits: The paranasal sinuses and mastoid air cells are clear. Bilateral lens replacements are present. Globes and orbits are otherwise within normal limits. IMPRESSION: 1. Age indeterminate left frontal lobe infarct may be acute. This is new since the prior exam and hyperdense to CSF, suggesting acute/subacute time frame rather than chronic. This could correlate with the patient's acute symptoms. 2. Right frontal lobe infarct in a similar location is smaller than on the left. This is also new since the prior study. 3. Age indeterminate posterior left cerebellar  infarct measures up to 1.6 cm. 4. Otherwise stable atrophy and white matter disease likely reflecting the sequela of chronic microvascular ischemia. Electronically Signed   By: Marin Roberts M.D.   On: 06/28/2018 12:12    EKG: I independently viewed the EKG done and my findings are as followed: Independently reviewed twelve-lead EKG done on admission which revealed sinus bradycardia with a rate of 57.  Assessment/Plan Present on Admission: . CVA (cerebral vascular accident) [facility])  Active Problems:   CVA (cerebral vascular accident) (HCC)  Subacute versus acute CVA affecting bilateral frontal lobes and left cerebellar lobe Aspirin rectally MRI brain N.p.o. until passes swallow evaluation Speech therapist PT OT Neurochecks every 4 hours 2D echo Bilateral carotid Doppler ultrasound Lipid panel, A1c Neuro consult  Accelerated hypertension most likely secondary to subacute vs acute CVA Permissive hypertension Treat when blood pressure greater than 220 systolically and 110 diastolically Resume antihypertensive medications when passes swallow eval N.p.o.  Type 2 diabetes Hold anti-glycemic oral medications Insulin sliding scale Avoid hypoglycemia  Chronic depression Hold Zoloft until passes swallow evaluation  Dementia Hold Aricept until passes swallow eval  Ambulatory dysfunction PT OT to assess Uses a walker at baseline Fall precautions  Risks: High risk for decompensation due to subacute versus acute CVA, multiple comorbidities, and advanced age.   DVT prophylaxis: Subcu Lovenox daily  Code Status: Full code  Family Communication: Spoke with medical POA [name] on the phone on 06/28/2018.  Disposition Plan: Admit to telemetry unit  Consults called: Neurology consulted by ED physician  Admission status: Inpatient  Patient will require at least 2 midnights for further testings and treatment of present condition.    Darlin Drop MD [facility] Pager [phone]  If 7PM-7AM, please contact night-coverage [email or web address] Password [email or web address]  06/28/2018, 1:17 PM

## 2018-06-28 NOTE — ED Notes (Signed)
Notified admitting MD that pt's CBG has been trending down, and pt is NPO due to failing swallow screen. Received verbal order for D51/2 NS @ 75mL hr.

## 2018-06-28 NOTE — ED Notes (Signed)
This RN called MRI, pt will be transported from MRI to floor. I called floor RN and notified her that pt will come from MRI

## 2018-06-28 NOTE — ED Notes (Signed)
Notified CT that pt is ready for scan.  

## 2018-06-29 ENCOUNTER — Inpatient Hospital Stay (HOSPITAL_COMMUNITY): Payer: Medicare Other

## 2018-06-29 DIAGNOSIS — I361 Nonrheumatic tricuspid (valve) insufficiency: Secondary | ICD-10-CM

## 2018-06-29 LAB — URINALYSIS, ROUTINE W REFLEX MICROSCOPIC
Bilirubin Urine: NEGATIVE
Glucose, UA: 50 mg/dL — AB
KETONES UR: NEGATIVE mg/dL
NITRITE: NEGATIVE
PH: 7 (ref 5.0–8.0)
PROTEIN: 100 mg/dL — AB
Specific Gravity, Urine: 1.003 — ABNORMAL LOW (ref 1.005–1.030)
WBC, UA: >50 WBC/hpf — ABNORMAL HIGH (ref 0–5)

## 2018-06-29 LAB — CBC
HCT: 33.7 % — ABNORMAL LOW (ref 36.0–46.0)
Hemoglobin: 11.2 g/dL — ABNORMAL LOW (ref 12.0–15.0)
MCH: 29.6 pg (ref 26.0–34.0)
MCHC: 33.2 g/dL (ref 30.0–36.0)
MCV: 88.9 fL (ref 78.0–100.0)
Platelets: 161 K/uL (ref 150–400)
RBC: 3.79 MIL/uL — ABNORMAL LOW (ref 3.87–5.11)
RDW: 14.2 % (ref 11.5–15.5)
WBC: 5.2 K/uL (ref 4.0–10.5)

## 2018-06-29 LAB — BASIC METABOLIC PANEL
Anion gap: 8 (ref 5–15)
BUN: 8 mg/dL (ref 8–23)
CALCIUM: 9 mg/dL (ref 8.9–10.3)
CO2: 26 mmol/L (ref 22–32)
CREATININE: 0.81 mg/dL (ref 0.44–1.00)
Chloride: 103 mmol/L (ref 98–111)
GFR calc Af Amer: >60 mL/min (ref >60)
Glucose, Bld: 127 mg/dL — ABNORMAL HIGH (ref 70–99)
Potassium: 3.7 mmol/L (ref 3.5–5.1)
SODIUM: 137 mmol/L (ref 135–145)

## 2018-06-29 LAB — GLUCOSE, CAPILLARY
GLUCOSE-CAPILLARY: 143 mg/dL — AB (ref 70–99)
GLUCOSE-CAPILLARY: 244 mg/dL — AB (ref 70–99)
GLUCOSE-CAPILLARY: 260 mg/dL — AB (ref 70–99)
GLUCOSE-CAPILLARY: 96 mg/dL (ref 70–99)
Glucose-Capillary: 133 mg/dL — ABNORMAL HIGH (ref 70–99)

## 2018-06-29 MED ORDER — ATORVASTATIN CALCIUM 80 MG PO TABS
80.0000 mg | ORAL_TABLET | Freq: Every day | ORAL | Status: DC
  Administered 2018-06-29 – 2018-07-04 (×6): 80 mg via ORAL
  Filled 2018-06-29 (×6): qty 1

## 2018-06-29 MED ORDER — ASPIRIN 325 MG PO TABS
325.0000 mg | ORAL_TABLET | Freq: Every day | ORAL | Status: DC
  Administered 2018-06-29: 325 mg via ORAL
  Filled 2018-06-29: qty 1

## 2018-06-29 MED ORDER — SODIUM CHLORIDE 0.9 % IV SOLN
1.0000 g | INTRAVENOUS | Status: DC
  Administered 2018-06-29 – 2018-06-30 (×2): 1 g via INTRAVENOUS
  Filled 2018-06-29 (×3): qty 10

## 2018-06-29 MED ORDER — ASPIRIN EC 81 MG PO TBEC
81.0000 mg | DELAYED_RELEASE_TABLET | Freq: Every day | ORAL | Status: DC
  Administered 2018-06-30 – 2018-07-05 (×6): 81 mg via ORAL
  Filled 2018-06-29 (×7): qty 1

## 2018-06-29 MED ORDER — CLOPIDOGREL BISULFATE 75 MG PO TABS
75.0000 mg | ORAL_TABLET | Freq: Every day | ORAL | Status: DC
  Administered 2018-06-29 – 2018-07-05 (×7): 75 mg via ORAL
  Filled 2018-06-29 (×7): qty 1

## 2018-06-29 NOTE — Progress Notes (Signed)
PROGRESS NOTE  Diana Hartman:[id number] DOB: September 09, 1941 DOA: 06/28/2018 PCP: [name]   LOS: 1 day   Brief Narrative / Interim history: 77 year old female with history of dementia, hypertension, type 2 diabetes mellitus, who was brought to the hospital by Diana Hartman's daughters due to right-sided weakness that started the day prior.  Symptoms did not improve overnight and decided to bring the Diana Hartman to the hospital, she was diagnosed with a CVA and was admitted on 7/23.  Neurology was consulted.  Assessment & Plan: Active Problems:   CVA (cerebral vascular accident) (HCC)   Acute CVA -MRI on admission showed an acute left internal capsule nonhemorrhagic infarct, as well as 1 subacute to old left frontal infarct and old right frontal infarct. -Neurology consulted, appreciate input, discussed with Dr. Pearlean Brownie today -For now recommending dual antiplatelet therapy for 3 weeks and followed by aspirin alone -2D echo pending -Lipid panel showed an LDL of 86, place on statin -Hemoglobin A1c was 6.3 -PT evaluation pending  Hypertension -Allow permissive hypertension now  Type 2 diabetes mellitus -Placed on sliding scale, hold home oral agents  Dementia/depression -Appears slightly worse than baseline with Diana Hartman's being almost nonverbal  UTI -UA grossly positive, difficult to determine whether she has dysuria or any local symptoms, but would favor to get cultures and give her ceftriaxone for a few days in case this contributes to her worsening dementia   DVT prophylaxis: Lovenox Code Status: Full code Family Communication: daughters at bedside Disposition Plan: TBD  Consultants:   Neurology   Procedures:   2D echo: pending   Antimicrobials:  None    Subjective: -She appears comfortable, mumbles her answers but is unclear what she is saying.  Does not appear to be in pain  Objective: Vitals:   06/28/18 2147 06/28/18 2258 06/29/18 0351 06/29/18  0746  BP: (!) 150/83 (!) 151/72 (!) 157/76 (!) 151/92  Pulse: (!) 52 60 (!) 59 65  Resp: 18  20 18   Temp: 97.9 F (36.6 C) 97.9 F (36.6 C) 98.7 F (37.1 C) 98.6 F (37 C)  TempSrc: Oral Oral Oral Oral  SpO2: 97% 96% 99% 97%  Weight:      Height:        Intake/Output Summary (Last 24 hours) at 06/29/2018 1346 Last data filed at 06/29/2018 0300 Gross per 24 hour  Intake 514.36 ml  Output -  Net 514.36 ml   Filed Weights   06/28/18 1910  Weight: 63.2 kg (139 lb 5.3 oz)    Examination:  Constitutional: NAD Eyes: PERRL, lids and conjunctivae normal Neck: normal, supple Respiratory: clear to auscultation bilaterally, no wheezing, no crackles. Normal respiratory effort. No accessory muscle use.  Cardiovascular: Regular rate and rhythm, no murmurs / rubs / gallops. No LE edema.  Abdomen: no tenderness.   Skin: no rashes Neurologic: Does not follow commands consistently, overall difficult exam but does appear little bit weaker on the right   Data Reviewed: I have independently reviewed following labs and imaging studies   CBC: Recent Labs  Lab 06/28/18 1057 06/28/18 1106 06/29/18 0451  WBC 6.1  --  5.2  NEUTROABS 2.6  --   --   HGB 11.4* 12.2 11.2*  HCT 36.1 36.0 33.7*  MCV 92.6  --  88.9  PLT 160  --  161   Basic Metabolic Panel: Recent Labs  Lab 06/28/18 1057 06/28/18 1106 06/29/18 0451  NA 139 140 137  K 4.0 4.0 3.7  CL 105 104 103  CO2 28  --  26  GLUCOSE 69* 68* 127*  BUN 8 8 8   CREATININE 0.78 0.70 0.81  CALCIUM 9.1  --  9.0   GFR: Estimated Creatinine Clearance: 51.2 mL/min (by C-G formula based on SCr of 0.81 mg/dL). Liver Function Tests: Recent Labs  Lab 06/28/18 1057  AST 32  ALT 18  ALKPHOS 115  BILITOT 0.6  PROT 7.7  ALBUMIN 2.9*   No results for input(s): LIPASE, AMYLASE in the last 168 hours. No results for input(s): AMMONIA in the last 168 hours. Coagulation Profile: Recent Labs  Lab 06/28/18 1057  INR 1.14   Cardiac  Enzymes: No results for input(s): CKTOTAL, CKMB, CKMBINDEX, TROPONINI in the last 168 hours. BNP (last 3 results) No results for input(s): PROBNP in the last 8760 hours. HbA1C: Recent Labs    06/28/18 1057  HGBA1C 6.3*   CBG: Recent Labs  Lab 06/28/18 1444 06/28/18 1938 06/28/18 2347 06/29/18 0327 06/29/18 0744  GLUCAP 71 76 114* 133* 143*   Lipid Profile: Recent Labs    06/28/18 1057  CHOL 146  HDL 46  LDLCALC 86  TRIG 72  CHOLHDL 3.2   Thyroid Function Tests: No results for input(s): TSH, T4TOTAL, FREET4, T3FREE, THYROIDAB in the last 72 hours. Anemia Panel: No results for input(s): VITAMINB12, FOLATE, FERRITIN, TIBC, IRON, RETICCTPCT in the last 72 hours. Urine analysis:    Component Value Date/Time   COLORURINE AMBER (A) 06/29/2018 1017   APPEARANCEUR TURBID (A) 06/29/2018 1017   LABSPEC 1.003 (L) 06/29/2018 1017   PHURINE 7.0 06/29/2018 1017   GLUCOSEU 50 (A) 06/29/2018 1017   HGBUR MODERATE (A) 06/29/2018 1017   BILIRUBINUR NEGATIVE 06/29/2018 1017   KETONESUR NEGATIVE 06/29/2018 1017   PROTEINUR 100 (A) 06/29/2018 1017   UROBILINOGEN 1.0 07/04/2015 1220   NITRITE NEGATIVE 06/29/2018 1017   LEUKOCYTESUR LARGE (A) 06/29/2018 1017   Sepsis Labs: Invalid input(s): PROCALCITONIN, LACTICIDVEN  No results found for this or any previous visit (from the past 240 hour(s)).    Radiology Studies: Ct Head Wo Contrast  Result Date: 06/28/2018 CLINICAL DATA:  Focal neuro deficit, greater than 6 hours, stroke suspected. Left seen normal at 5 o'clock p.m. yesterday. Abnormal speech. Right-sided facial droop. EXAM: CT HEAD WITHOUT CONTRAST TECHNIQUE: Contiguous axial images were obtained from the base of the skull through the vertex without intravenous contrast. COMPARISON:  CT head without contrast 12/19/2015 and 07/04/2015. FINDINGS: Brain: A left frontal cortical infarct involves the left frontal operculum and inferior left frontal gyrus. This is new from the prior  exam and suspected to be acute or subacute. No acute infarct is evident along the precentral gyrus. A smaller right anterior frontal infarct is present in a similar location, also new since the prior exam. Atrophy and white matter changes are otherwise stable. Ventricles are proportionate to the degree of atrophy. No significant extra-axial fluid collection is present. A posterior left cerebellar infarct is new since the prior exam. The brain stem and cerebellum are otherwise unremarkable. Vascular: Atherosclerotic calcifications are present within the cavernous internal carotid arteries bilaterally. There is no hyperdense vessel. Skull: The calvarium is intact. No focal lytic or blastic lesions are present. Sinuses/Orbits: The paranasal sinuses and mastoid air cells are clear. Bilateral lens replacements are present. Globes and orbits are otherwise within normal limits. IMPRESSION: 1. Age indeterminate left frontal lobe infarct may be acute. This is new since the prior exam and hyperdense to CSF, suggesting acute/subacute time frame rather than chronic. This could  correlate with the Diana Hartman's acute symptoms. 2. Right frontal lobe infarct in a similar location is smaller than on the left. This is also new since the prior study. 3. Age indeterminate posterior left cerebellar infarct measures up to 1.6 cm. 4. Otherwise stable atrophy and white matter disease likely reflecting the sequela of chronic microvascular ischemia. Electronically Signed   By: Marin Robertshristopher  Mattern M.D.   On: 06/28/2018 12:12   Mr Laqueta JeanBrain W EAWo Contrast  Result Date: 06/28/2018 CLINICAL DATA:  Slurred speech, RIGHT upper extremity weakness. Last seen normal yesterday evening. History of hypertension, diabetes, temporal arteritis and dementia. EXAM: MRI HEAD WITHOUT AND WITH CONTRAST TECHNIQUE: Multiplanar, multiecho pulse sequences of the brain and surrounding structures were obtained without and with intravenous contrast. CONTRAST:  14mL  MULTIHANCE GADOBENATE DIMEGLUMINE 529 MG/ML IV SOLN COMPARISON:  CT HEAD June 28, 2018.  MRI of the head Apr 18, 2012. FINDINGS: Multiple sequences are moderately motion degraded. INTRACRANIAL CONTENTS: 5 x 16 mm reduced diffusion LEFT posterior limb of the internal capsule with low ADC values. Bifrontal encephalomalacia with susceptibility artifact. Trace LEFT frontal cortical enhancement. No susceptibility artifact to suggest hemorrhage. Severe mesial temporal lobe atrophy with ex vacuo dilatation temporal horns, associated knife-like gyri. Generalized moderate to severe parenchymal brain volume loss. No hydrocephalus. No midline shift, mass effect or masses. No abnormal extra-axial fluid collections. No suspicious intracranial enhancement. LEFT cerebellar developmental venous anomaly. VASCULAR: Normal major intracranial vascular flow voids present at skull base. SKULL AND UPPER CERVICAL SPINE: Empty sella. No suspicious calvarial bone marrow signal. Craniocervical junction maintained. SINUSES/ORBITS: The mastoid air-cells and included paranasal sinuses are well-aerated.The included ocular globes and orbital contents are non-suspicious. OTHER: None. IMPRESSION: 1. Moderately motion degraded examination. Acute 5 x 16 mm LEFT internal capsule nonhemorrhagic infarct. 2. Subacute to old LEFT frontal/MCA territory infarct. Old RIGHT frontal/MCA territory infarct. 3. Severe temporal lobe atrophy seen with neurodegenerative syndromes. Electronically Signed   By: Awilda Metroourtnay  Bloomer M.D.   On: 06/28/2018 18:19     Scheduled Meds: . aspirin  325 mg Oral Daily  . enoxaparin (LOVENOX) injection  40 mg Subcutaneous Q24H  . insulin aspart  0-9 Units Subcutaneous Q4H   Continuous Infusions: . dextrose 5 % and 0.45% NaCl 75 mL/hr at 06/28/18 2007    Pamella Pertostin Lindi Abram, MD, PhD [facility] Pager [phone]  If 7PM-7AM, please contact night-coverage [email or web address] Password [email or web address] 06/29/2018, 1:46 PM

## 2018-06-29 NOTE — Progress Notes (Signed)
  Echocardiogram 2D Echocardiogram has been performed.  Delcie RochENNINGTON, Jolonda Gomm 06/29/2018, 12:42 PM

## 2018-06-29 NOTE — Evaluation (Signed)
Clinical/Bedside Swallow Evaluation Patient Details  Name: Diana Hartman MRN: [id number] Date of Birth: 1941-11-02  Today's Date: 06/29/2018 Time: SLP Start Time (ACUTE ONLY): 0809 SLP Stop Time (ACUTE ONLY): 0848 SLP Time Calculation (min) (ACUTE ONLY): 39 min  Past Medical History:  Past Medical History:  Diagnosis Date  . Dementia   . Diabetes mellitus   . Hypertension   . Temporal arteritis (HCC)    Past Surgical History:  Past Surgical History:  Procedure Laterality Date  . NO PAST SURGERIES     HPI:  [facility] 77 [facility]ided [facility].  Pt found to have left Interal capsule CVA, left frontal MCA impacting MCA also with severe temporal atrophy.  Swallow eval ordered as pt did not pass an RNSSS.  Daughter present and reports pt eats soft diet at home and is able to feed herself with her right hand.  She denies pt having any coughing associated with po intake.     Assessment / Plan / Recommendation Clinical Impression  Pt presents with mild oral dysphagia likely due to combination of dementia and new CVA.  Suspected piecemealing with liquids as pt conducts mulitple swallows with each bolus.  Delayed oral transiting likely with solids - but no pocketing.    No indication of aspiration with po provided.  Pt helped to feed herself cracker and drink via cup hand over hand to reach oral cavity.  However as po trials continued she became resistant.  Recommend dys3/thin diet with strict precautions.  Will follow up to assure tolerance and for indication of instrumental evaluation necessity.  Educated daughters x2 to findings/recommendations.  SLP Visit Diagnosis: Dysphagia, oral phase (R13.11)    Aspiration Risk  Moderate aspiration risk    Diet Recommendation Dysphagia 3 (Mech soft);Thin liquid   Liquid Administration via: Spoon;Straw Medication Administration: Crushed with puree Supervision: Staff to assist with self feeding;Full supervision/cueing for  compensatory strategies Compensations: Slow rate;Small sips/bites Postural Changes: Seated upright at 90 degrees;Remain upright for at least 30 minutes after po intake    Other  Recommendations Oral Care Recommendations: Oral care QID   Follow up Recommendations        Frequency and Duration min 2x/week  1 week       Prognosis Prognosis for Safe Diet Advancement: Fair Barriers to Reach Goals: Language deficits;Cognitive deficits;Behavior      Swallow Study   General Date of Onset: 06/29/18 HPI: [facility] 77 [facility].  Pt found to have left Interal capsule CVA, left frontal MCA impacting MCA also with severe temporal atrophy.  Swallow eval ordered as pt did not pass an RNSSS.  Daughter present and reports pt eats soft diet at home and is able to feed herself with her right hand.  She denies pt having any coughing associated with po intake.   Type of Study: Bedside Swallow Evaluation Previous Swallow Assessment: none Diet Prior to this Study: NPO Temperature Spikes Noted: No Respiratory Status: Room air History of Recent Intubation: No Behavior/Cognition: Alert;Doesn't follow directions;Requires cueing Oral Cavity Assessment: Dry Oral Care Completed by SLP: No Oral Cavity - Dentition: Other (Comment);Missing dentition;Adequate natural dentition(few lower dentition) Vision: Impaired for self-feeding Self-Feeding Abilities: Needs assist Patient Positioning: Upright in bed Baseline Vocal Quality: Low vocal intensity Volitional Cough: Cognitively unable to elicit Volitional Swallow: Unable to elicit    Oral/Motor/Sensory Function Overall Oral Motor/Sensory Function: Moderate impairment(pt did not follow commands for  OME< able to seal lips on cup, right facial asymmetry obvious) Facial ROM: Reduced right Facial Symmetry: Abnormal symmetry right   Ice Chips Ice chips: Not tested   Thin Liquid Thin Liquid: Impaired Presentation: Straw;Spoon Oral  Phase Impairments: Reduced labial seal Oral Phase Functional Implications: Prolonged oral transit Pharyngeal  Phase Impairments: Multiple swallows    Nectar Thick Nectar Thick Liquid: Not tested   Honey Thick Honey Thick Liquid: Not tested   Puree Puree: Within functional limits Presentation: Spoon   Solid   GO   Solid: Impaired Other Comments: slow but effective mastication, no oral residuals present- had to use dry spoon to get pt to open mouth to assess for oral residuals        Chales Abrahams 06/29/2018,9:07 AM   Donavan Burnet, MS [facility] SLP [phone]

## 2018-06-29 NOTE — Progress Notes (Addendum)
STROKE TEAM PROGRESS NOTE  HPI:( Dr Amada JupiterKirkpatrick ) Diana Hartman is a 77 y.o. female with a history of dementia with little verbal output who presents with right-sided weakness that was noted on Monday.  These did not improve and so they brought her to the emergency department yesterday. LKW: Unclear, deficits noted on Monday 06/27/18 tpa given?: no, unclear time of onset   INTERVAL HISTORY Her daughter and RN are at the bedside.  At baseline, pt can feed herself, walk with the rollator with family behind her and can communicate. She has 24/7 care. Given ongoing aphasia and R HP, will need increased help at home versus SNF.  Dr. Pearlean BrownieSethi discussed this with daughter..   Vitals:   06/28/18 2147 06/28/18 2258 06/29/18 0351 06/29/18 0746  BP: (!) 150/83 (!) 151/72 (!) 157/76 (!) 151/92  Pulse: (!) 52 60 (!) 59 65  Resp: 18  20 18   Temp: 97.9 F (36.6 C) 97.9 F (36.6 C) 98.7 F (37.1 C) 98.6 F (37 C)  TempSrc: Oral Oral Oral Oral  SpO2: 97% 96% 99% 97%  Weight:      Height:        CBC:  Recent Labs  Lab 06/28/18 1057 06/28/18 1106 06/29/18 0451  WBC 6.1  --  5.2  NEUTROABS 2.6  --   --   HGB 11.4* 12.2 11.2*  HCT 36.1 36.0 33.7*  MCV 92.6  --  88.9  PLT 160  --  161    Basic Metabolic Panel:  Recent Labs  Lab 06/28/18 1057 06/28/18 1106 06/29/18 0451  NA 139 140 137  K 4.0 4.0 3.7  CL 105 104 103  CO2 28  --  26  GLUCOSE 69* 68* 127*  BUN 8 8 8   CREATININE 0.78 0.70 0.81  CALCIUM 9.1  --  9.0   Lipid Panel:     Component Value Date/Time   CHOL 146 06/28/2018 1057   TRIG 72 06/28/2018 1057   HDL 46 06/28/2018 1057   CHOLHDL 3.2 06/28/2018 1057   VLDL 14 06/28/2018 1057   LDLCALC 86 06/28/2018 1057   HgbA1c:  Lab Results  Component Value Date   HGBA1C 6.3 (H) 06/28/2018   Urine Drug Screen: No results found for: LABOPIA, COCAINSCRNUR, LABBENZ, AMPHETMU, THCU, LABBARB  Alcohol Level No results found for: ETH  IMAGING Ct Head Wo Contrast  Result  Date: 06/28/2018 CLINICAL DATA:  Focal neuro deficit, greater than 6 hours, stroke suspected. Left seen normal at 5 o'clock p.m. yesterday. Abnormal speech. Right-sided facial droop. EXAM: CT HEAD WITHOUT CONTRAST TECHNIQUE: Contiguous axial images were obtained from the base of the skull through the vertex without intravenous contrast. COMPARISON:  CT head without contrast 12/19/2015 and 07/04/2015. FINDINGS: Brain: A left frontal cortical infarct involves the left frontal operculum and inferior left frontal gyrus. This is new from the prior exam and suspected to be acute or subacute. No acute infarct is evident along the precentral gyrus. A smaller right anterior frontal infarct is present in a similar location, also new since the prior exam. Atrophy and white matter changes are otherwise stable. Ventricles are proportionate to the degree of atrophy. No significant extra-axial fluid collection is present. A posterior left cerebellar infarct is new since the prior exam. The brain stem and cerebellum are otherwise unremarkable. Vascular: Atherosclerotic calcifications are present within the cavernous internal carotid arteries bilaterally. There is no hyperdense vessel. Skull: The calvarium is intact. No focal lytic or blastic lesions are present. Sinuses/Orbits:  The paranasal sinuses and mastoid air cells are clear. Bilateral lens replacements are present. Globes and orbits are otherwise within normal limits. IMPRESSION: 1. Age indeterminate left frontal lobe infarct may be acute. This is new since the prior exam and hyperdense to CSF, suggesting acute/subacute time frame rather than chronic. This could correlate with the patient's acute symptoms. 2. Right frontal lobe infarct in a similar location is smaller than on the left. This is also new since the prior study. 3. Age indeterminate posterior left cerebellar infarct measures up to 1.6 cm. 4. Otherwise stable atrophy and white matter disease likely reflecting the  sequela of chronic microvascular ischemia. Electronically Signed   By: Marin Roberts M.D.   On: 06/28/2018 12:12   Mr Laqueta Jean BM Contrast  Result Date: 06/28/2018 CLINICAL DATA:  Slurred speech, RIGHT upper extremity weakness. Last seen normal yesterday evening. History of hypertension, diabetes, temporal arteritis and dementia. EXAM: MRI HEAD WITHOUT AND WITH CONTRAST TECHNIQUE: Multiplanar, multiecho pulse sequences of the brain and surrounding structures were obtained without and with intravenous contrast. CONTRAST:  14mL MULTIHANCE GADOBENATE DIMEGLUMINE 529 MG/ML IV SOLN COMPARISON:  CT HEAD June 28, 2018.  MRI of the head Apr 18, 2012. FINDINGS: Multiple sequences are moderately motion degraded. INTRACRANIAL CONTENTS: 5 x 16 mm reduced diffusion LEFT posterior limb of the internal capsule with low ADC values. Bifrontal encephalomalacia with susceptibility artifact. Trace LEFT frontal cortical enhancement. No susceptibility artifact to suggest hemorrhage. Severe mesial temporal lobe atrophy with ex vacuo dilatation temporal horns, associated knife-like gyri. Generalized moderate to severe parenchymal brain volume loss. No hydrocephalus. No midline shift, mass effect or masses. No abnormal extra-axial fluid collections. No suspicious intracranial enhancement. LEFT cerebellar developmental venous anomaly. VASCULAR: Normal major intracranial vascular flow voids present at skull base. SKULL AND UPPER CERVICAL SPINE: Empty sella. No suspicious calvarial bone marrow signal. Craniocervical junction maintained. SINUSES/ORBITS: The mastoid air-cells and included paranasal sinuses are well-aerated.The included ocular globes and orbital contents are non-suspicious. OTHER: None. IMPRESSION: 1. Moderately motion degraded examination. Acute 5 x 16 mm LEFT internal capsule nonhemorrhagic infarct. 2. Subacute to old LEFT frontal/MCA territory infarct. Old RIGHT frontal/MCA territory infarct. 3. Severe temporal lobe  atrophy seen with neurodegenerative syndromes. Electronically Signed   By: Awilda Metro M.D.   On: 06/28/2018 18:19    PHYSICAL EXAM Frail cachectic elderly   African-American lady not in distress. . Afebrile. Head is nontraumatic. Neck is supple without bruit.    Cardiac exam no murmur or gallop. Lungs are clear to auscultation. Distal pulses are well felt. Neurological Exam :  Awake alert disoriented. Speech is unintelligible though she can voice a few words. She does not follow any commands including midline. Left gaze preference but able to look to the right past midline. Blinks to threat on the left but not the right. Right lower facial weakness. Tongue midline. Motor system exam normal strength on the left against gravity. Right hemiplegia with 2/5 right upper extremity and 3/5 right lower extremity strength and withdraws to pain on the right. Left plantar is downgoing right is equivocal. Gait not tested. ASSESSMENT/PLAN Diana Hartman is a 77 y.o. female with history of advanced dementia requiring 24-hour care at home, diabetes, hypertension and temporal arteritis presenting with right-sided weakness and decreased speech output.   Stroke:   Left internal capsule infarct secondary to small vessel disease at baseline on her dementia  CT head left frontal lobe infarct which may be acute.  Right frontal lobe infarct.  Age indeterminate posterior left cerebellar infarct.  Atrophy.  Small vessel disease.  MRI acute left internal capsule infarct.  Subacute to old left frontal/MCA territory infarct.  Old right frontal/MCA territory infarct.  Severe temporal lobe atrophy.  Carotid Doppler  B ICA 1-39% stenosis, VAs antegrade   2D Echo EF 60 to 65% with no source of embolus noted  LDL 86  HgbA1c 6.3  Lovenox 40 mg sq daily for VTE prophylaxis  No antithrombotic prior to admission, now on aspirin 325 mg daily. Given stroke, will place on aspirin 81 mg and plavix 75 mg daily x 3 weeks,  then aspirin alone. Orders adjusted.   Therapy recommendations:  pending   Disposition:  pending (has 24/7 care at home)  Hypertension  Stable . Permissive hypertension (OK if < 220/120) but gradually normalize in 5-7 days . Long-term BP goal normotensive  Hyperlipidemia  Home meds:  No statin  LDL 86, goal < 70  Now on Lipitor 80 mg daily   Continue statin at discharge  Diabetes type II  HgbA1c 6.3, at goal < 7.0  Recommend avoiding D5 1/2 NS in stroke patients if possible - discussed with Dr. [name]. He will review.  Other Stroke Risk Factors  Advanced age  Other Active Problems  Dementia/depression  UTI  NOTHING FURTHER TO ADD FROM THE STROKE STANDPOINT  Follow-up Stroke Clinic at [facility] in 4 weeks. Office will call with appointment date and time. Order placed.  Hospital day # 1  Annie Main, MSN, APRN, ANVP-BC, AGPCNP-BC Advanced Practice Stroke Nurse [facility] See Amion for Schedule & Pager information 06/29/2018 2:11 PM  I have personally examined this patient, reviewed notes, independently viewed imaging studies, participated in medical decision making and plan of care.ROS completed by me personally and pertinent positives fully documented  I have made any additions or clarifications directly to the above note. Agree with note above. She presented with aphasia and right hemiplegia due to left brain subcortical infarct and has profound dementia at baseline.recommend dual antiplatelet therapy for 3 weeks followed by aspirin alone. Long discussion with the daughter at the bedside and answered questions. Greater than 50% time during this 25 minute visit was spent on counseling and coordination of care about a stroke and dementia and answered questions.Stroke team will sign off. Kindly call for questions. Delia Heady, MD Medical Director [facility] Pager: [phone] 06/29/2018 2:55 PM  To contact Stroke  Continuity provider, please refer to [email or web address]. After hours, contact General Neurology

## 2018-06-29 NOTE — Care Management Note (Signed)
Case Management Note  Patient Details  Name: Diana Hartman MRN: [id number] Date of Birth: 1941-01-15  Subjective/Objective:       Pt admitted with CVA. She is from home with her daughter and needs assist with ADL's.              Action/Plan: Awaiting PT/OT evals. CM following for d/c disposition.   Expected Discharge Date:                  Expected Discharge Plan:     In-House Referral:     Discharge planning Services     Post Acute Care Choice:    Choice offered to:     DME Arranged:    DME Agency:     HH Arranged:    HH Agency:     Status of Service:  In process, will continue to follow  If discussed at Long Length of Stay Meetings, dates discussed:    Additional Comments:  Kermit BaloKelli F Deette Revak, RN 06/29/2018, 11:30 AM

## 2018-06-29 NOTE — Consult Note (Signed)
Neurology Consultation Reason for Consult: Stroke Referring Physician: Margo AyeHall, C  CC: Right-sided weakness  History is obtained from: Daughter  HPI: Diana Hartman is a 77 y.o. female with a history of dementia with little verbal output who presents with right-sided weakness that was noted on Monday.  These did not improve and so they brought her to the emergency department yesterday.   LKW: Unclear, deficits noted on Monday tpa given?: no, unclear time of onset   ROS:  Unable to obtain due to altered mental status.   Past Medical History:  Diagnosis Date  . Dementia   . Diabetes mellitus   . Hypertension   . Temporal arteritis (HCC)      Family History  Problem Relation Age of Onset  . Diabetes type II Other   . Hypertension Other      Social History:  reports that she has never smoked. She has never used smokeless tobacco. She reports that she does not drink alcohol or use drugs.   Exam: Current vital signs: BP (!) 157/76 (BP Location: Left Arm)   Pulse (!) 59   Temp 98.7 F (37.1 C) (Oral)   Resp 20   Ht 5\' 3"  (1.6 m)   Wt 63.2 kg (139 lb 5.3 oz)   SpO2 99%   BMI 24.68 kg/m  Vital signs in last 24 hours: Temp:  [97.9 F (36.6 C)-99.4 F (37.4 C)] 98.7 F (37.1 C) (07/24 0351) Pulse Rate:  [52-64] 59 (07/24 0351) Resp:  [13-20] 20 (07/24 0351) BP: (150-178)/(63-114) 157/76 (07/24 0351) SpO2:  [96 %-100 %] 99 % (07/24 0351) Weight:  [63.2 kg (139 lb 5.3 oz)] 63.2 kg (139 lb 5.3 oz) (07/23 1910)   Physical Exam  Constitutional: Appears well-developed and well-nourished.  Psych: Affect appropriate to situation Eyes: No scleral injection HENT: No OP obstrucion Head: Normocephalic.  Cardiovascular: Normal rate and regular rhythm.  Respiratory: Effort normal, non-labored breathing GI: Soft.  No distension. There is no tenderness.  Skin: WDI  Neuro: Mental Status: Patient is awake, alert, her speech is not intelligible and she does not follow  commands. Cranial Nerves: II: She does blink to threat from the left, I do not see her clearly blink to threat from the right but at this point she is not bleeding from the left either given that she is suppressing it.  Pupils are equal, round, and reactive to light.   III,IV, VI: She crosses midline in both directions  VII: Decreased nasolabial fold on the right VIII: hearing is intact to voice Motor: She has good strength on the left, and she does resist gravity in the right arm, withdraws right leg minimally Sensory: She reacts to noxious stimulation in all 4 extremities  Cerebellar: She does not perform  I have reviewed labs in epic and the results pertinent to this consultation are: BMP-unremarkable  I have reviewed the images obtained: MRI brain- internal capsule stroke on the left.  There is slightly large, I suspect that this is due to small vessel disease  Impression: 77 year old female with advanced dementia with new subcortical stroke.  I suspect small vessel disease.  She has been admitted for secondary risk factor modification.  She is not a candidate for carotid surgery and therefore I do not think that Dopplers are needed.  Recommendations: - HgbA1c, fasting lipid panel - Frequent neuro checks - Echocardiogram - Prophylactic therapy-Antiplatelet med: Aspirin - dose 325mg  PO or 300mg  PR - Risk factor modification - Telemetry monitoring - PT  consult, OT consult, Speech consult - Stroke team to follow   Roland Rack, MD [facility] [phone]  If 7pm- 7am, please page neurology on call as listed in [place].

## 2018-06-29 NOTE — Evaluation (Signed)
Occupational Therapy Evaluation Patient Details Name: Diana Hartman MRN: [id number] DOB: 07-14-1941 Today's Date: 06/29/2018    History of Present Illness Patient is a [age] t/o female presenting with R sided weakness with associated slurred speech. CT head with no contrast revealed age indeterminate left frontal lobe infarct suggesting acute/subacute timeframe rather than chronic.  Also right frontal lobe infarct in a similar location smaller than the left.  And also age-indeterminate posterior left cerebellar infarct measuring up to 1.6 cm. MRI brain- internal capsule stroke on the left. PMH significant for dementia, hypertension, type 2 diabetes, chronic depression.    Clinical Impression   PT admitted with L frontal lobe infarct, R frontal lobe infacrt , L posterior cerebellar infarct and L internal capsule CVA. Pt currently with functional limitiations due to the deficits listed below (see OT problem list). Pt currently total (A) for all adls and total +2 Total for transfers. Pt with R inattention and visual scanning.  Pt will benefit from skilled OT to increase their independence and safety with adls and balance to allow discharge SNF. Daughter present during session and agreeable to SNF due to total+2 (A) required..     Follow Up Recommendations  SNF    Equipment Recommendations  3 in 1 bedside commode;Wheelchair (measurements OT);Wheelchair cushion (measurements OT);[facility]    Recommendations for Other Services Speech consult;PT consult     Precautions / Restrictions Precautions Precautions: Fall      Mobility Bed Mobility Overal bed mobility: Needs Assistance Bed Mobility: Rolling;Supine to Sit Rolling: Total assist   Supine to sit: Total assist;+2 for physical assistance;+2 for safety/equipment     General bed mobility comments: pt with more of a startle response to bed level transfer with reaching then to (A) Pt in fact moving BIL LE back  onto bed surface with attempts supine to sit   Transfers Overall transfer level: Needs assistance Equipment used: 2 person hand held assist Transfers: Sit to/from Stand;Stand Pivot Transfers Sit to Stand: +2 physical assistance;Total assist Stand pivot transfers: +2 physical assistance;Total assist;+2 safety/equipment       General transfer comment: pt with extension in standing but does not step or attempt to reach for chair. the chair was in R visual field so does not attend to chair at all    Balance                                           ADL either performed or assessed with clinical judgement   ADL Overall ADL's : Needs assistance/impaired Eating/Feeding: Total assistance   Grooming: Total assistance   Upper Body Bathing: Total assistance   Lower Body Bathing: Total assistance   Upper Body Dressing : Total assistance   Lower Body Dressing: Total assistance   Toilet Transfer: Total assistance;+2 for physical assistance;+2 for safety/equipment;BSC Toilet Transfer Details (indicate cue type and reason): simulated bed to chair  Toileting- Clothing Manipulation and Hygiene: Total assistance Toileting - Clothing Manipulation Details (indicate cue type and reason): pt incontinent on arrival and unaware. pt voiding bowel and bladder prior to start of session with total (A) for hygiene. pt does reach for therapist with peri care in response to tactile input from wash cloth       General ADL Comments: pt at this time is total (A) for all care. Daughter reports patient must be one person (A)  level to return home and be able to complete basic transfer 1 person     Vision   Additional Comments: pt prefers L visual field but diffcult to fully assess due to not follow commands.      Perception     Praxis      Pertinent Vitals/Pain Pain Assessment: No/denies pain     Hand Dominance Right   Extremity/Trunk Assessment Upper Extremity Assessment Upper  Extremity Assessment: Generalized weakness   Lower Extremity Assessment Lower Extremity Assessment: Defer to PT evaluation   Cervical / Trunk Assessment Cervical / Trunk Assessment: Kyphotic   Communication Communication Communication: Receptive difficulties;Expressive difficulties   Cognition Arousal/Alertness: Awake/alert Behavior During Therapy: Flat affect Overall Cognitive Status: History of cognitive impairments - at baseline                                 General Comments: pt not following commands after multimodal cues. pt at times demonstrates a generalized response. pt will hold hand in response but does not squeeze. pt shown thumbs up but does not repeat.    General Comments  noted to have edema at the IV site on R arm. RN called to room to address possible IV causing edema    Exercises     Shoulder Instructions      Home Living Family/patient expects to be discharged to:: Private residence Living Arrangements: Children Available Help at Discharge: Family;Available 24 hours/day Type of Home: House Home Access: Level entry     Home Layout: One level     Bathroom Shower/Tub: Other (comment)(sponge bath in bathroom at toilet)   Bathroom Toilet: Standard     Home Equipment: Cane - single point;Walker - 2 wheels   Additional Comments: daughterx2 present for home questions. pt with no verbalizations this sesion      Prior Functioning/Environment Level of Independence: Needs assistance  Gait / Transfers Assistance Needed: pt (A) from bed , pt (A) with basic transfer and using RW to move from room to room  ADL's / Homemaking Assistance Needed: pt requires total (A) for bathing and dressing. pt was able to static stand for daughters to complete hygiene throughout the peri care. pt could self feed all meals Communication / Swallowing Assistance Needed: pt was communicating and family as able to understand wants/ needs. daughter reports several  attempts to communicate this admission but unable to understand          OT Problem List: Decreased strength;Decreased activity tolerance;Impaired balance (sitting and/or standing);Decreased coordination;Decreased cognition;Impaired vision/perception;Decreased safety awareness;Decreased knowledge of use of DME or AE;Decreased knowledge of precautions      OT Treatment/Interventions: Therapeutic exercise;Self-care/ADL training;Neuromuscular education;Energy conservation;DME and/or AE instruction;Manual therapy;Therapeutic activities;Cognitive remediation/compensation;Visual/perceptual remediation/compensation;Patient/family education;Balance training    OT Goals(Current goals can be found in the care plan section) Acute Rehab OT Goals Patient Stated Goal: no verbalizations OT Goal Formulation: Patient unable to participate in goal setting Time For Goal Achievement: 07/13/18 Potential to Achieve Goals: Good  OT Frequency: Min 2X/week   Barriers to D/C:    must be 1 person A() level to return home       Co-evaluation PT/OT/SLP Co-Evaluation/Treatment: Yes Reason for Co-Treatment: Complexity of the patient's impairments (multi-system involvement);Necessary to address cognition/behavior during functional activity;For patient/therapist safety;To address functional/ADL transfers   OT goals addressed during session: ADL's and self-care;Proper use of Adaptive equipment and DME;Strengthening/ROM      AM-PAC PT "6 Clicks" Daily Activity  Outcome Measure Help from another person eating meals?: Total Help from another person taking care of personal grooming?: Total Help from another person toileting, which includes using toliet, bedpan, or urinal?: Total Help from another person bathing (including washing, rinsing, drying)?: Total Help from another person to put on and taking off regular upper body clothing?: Total Help from another person to put on and taking off regular lower body  clothing?: Total 6 Click Score: 6   End of Session Equipment Utilized During Treatment: Gait belt Nurse Communication: Mobility status;Precautions  Activity Tolerance: Patient tolerated treatment well Patient left: in chair;with call bell/phone within reach;with chair alarm set;with family/visitor present  OT Visit Diagnosis: Unsteadiness on feet (R26.81);Muscle weakness (generalized) (M62.81)                Time: 1610-9604 OT Time Calculation (min): 25 min Charges:  OT General Charges $OT Visit: 1 Visit OT Evaluation $OT Eval Moderate Complexity: 1 Mod G-Codes:      Mateo Flow   OTR/L Pager: [phone] Office: [phone] .   Boone Master B 06/29/2018, 3:01 PM

## 2018-06-29 NOTE — Evaluation (Signed)
Physical Therapy Evaluation Patient Details Name: Diana Hartman MRN: [id number] DOB: 1941/02/10 Today's Date: 06/29/2018   History of Present Illness  Patient is a [age] t/o female presenting with R sided weakness with associated slurred speech. CT head with no contrast revealed age indeterminate left frontal lobe infarct suggesting acute/subacute timeframe rather than chronic.  Also right frontal lobe infarct in a similar location smaller than the left.  And also age-indeterminate posterior left cerebellar infarct measuring up to 1.6 cm. MRI brain- internal capsule stroke on the left. PMH significant for dementia, hypertension, type 2 diabetes, chronic depression.     Clinical Impression  Diana Hartman admitted with the above listed diagnosis. Daughters present for session and reporting that they assisted patient with mobility and ADLs prior to admission. Patient today requiring Max A/Total A +2 for all bed mobil;ity and transfers today due to reduced functional mobility and reduced ability to follow commands throughout session needed for safety and improved independence with functional mobility. Currently recommending SNF at time of discharge to continue to progress functional mobility to prepare for safe return home and reduced caregiver burden.     Follow Up Recommendations SNF;Supervision/Assistance - 24 hour    Equipment Recommendations  (TBD at next venue of care)    Recommendations for Other Services OT consult;Speech consult     Precautions / Restrictions Precautions Precautions: Fall Restrictions Weight Bearing Restrictions: No      Mobility  Bed Mobility Overal bed mobility: Needs Assistance Bed Mobility: Rolling;Supine to Sit Rolling: Total assist;+2 for physical assistance   Supine to sit: Total assist;+2 for physical assistance;+2 for safety/equipment     General bed mobility comments: assist rolling for pericare; PT assisting LE towards/off of edge of bed - patient  replacing feet on bed during transfer  Transfers Overall transfer level: Needs assistance Equipment used: 2 person hand held assist Transfers: Sit to/from BJ'sStand;Stand Pivot Transfers Sit to Stand: Total assist;+2 physical assistance Stand pivot transfers: Total assist;+2 physical assistance       General transfer comment: limited hip/knee extension in standing with B knee blocking from PT/OT; does not attempt to assist with transfer  Ambulation/Gait                Stairs            Wheelchair Mobility    Modified Rankin (Stroke Patients Only) Modified Rankin (Stroke Patients Only) Pre-Morbid Rankin Score: Moderately severe disability Modified Rankin: Severe disability     Balance Overall balance assessment: Needs assistance Sitting-balance support: Single extremity supported;Feet supported Sitting balance-Leahy Scale: Poor Sitting balance - Comments: ranging from min guard to max A for sitting EOB                                     Pertinent Vitals/Pain Pain Assessment: No/denies pain    Home Living Family/patient expects to be discharged to:: Private residence Living Arrangements: Children Available Help at Discharge: Family;Available 24 hours/day Type of Home: House Home Access: Level entry     Home Layout: One level Home Equipment: Cane - single point;Walker - 2 wheels Additional Comments: daughterx2 present for home questions. pt with no verbalizations this sesion    Prior Function Level of Independence: Needs assistance   Gait / Transfers Assistance Needed: pt (A) from bed , pt (A) with basic transfer and using RW to move from room to room   ADL's / [employer] Assistance  Needed: pt requires total (A) for bathing and dressing. pt was able to static stand for daughters to complete hygiene throughout the peri care. pt could self feed all meals        Hand Dominance   Dominant Hand: Right    Extremity/Trunk Assessment    Upper Extremity Assessment Upper Extremity Assessment: Defer to OT evaluation    Lower Extremity Assessment Lower Extremity Assessment: Generalized weakness    Cervical / Trunk Assessment Cervical / Trunk Assessment: Kyphotic  Communication   Communication: Receptive difficulties;Expressive difficulties  Cognition Arousal/Alertness: Awake/alert Behavior During Therapy: Flat affect Overall Cognitive Status: History of cognitive impairments - at baseline                                 General Comments: patient unable to follow one-step commands throughout session. WIth PT assisted motion, some resistance      General Comments General comments (skin integrity, edema, etc.): noted to have edema at the IV site on R arm. RN called to room to address possible IV causing edema    Exercises     Assessment/Plan    PT Assessment Patient needs continued PT services  PT Problem List Decreased strength;Decreased activity tolerance;Decreased balance;Decreased mobility;Decreased coordination;Decreased knowledge of use of DME;Decreased safety awareness;Decreased knowledge of precautions       PT Treatment Interventions DME instruction;Gait training;Functional mobility training;Therapeutic activities;Therapeutic exercise;Balance training;Neuromuscular re-education;Patient/family education    PT Goals (Current goals can be found in the Care Plan section)  Acute Rehab PT Goals Patient Stated Goal: none stated PT Goal Formulation: With patient/family Time For Goal Achievement: 07/13/18 Potential to Achieve Goals: Fair    Frequency Min 2X/week   Barriers to discharge        Co-evaluation PT/OT/SLP Co-Evaluation/Treatment: Yes Reason for Co-Treatment: Complexity of the patient's impairments (multi-system involvement);Necessary to address cognition/behavior during functional activity;For patient/therapist safety;To address functional/ADL transfers PT goals addressed during  session: Mobility/safety with mobility OT goals addressed during session: ADL's and self-care;Proper use of Adaptive equipment and DME;Strengthening/ROM       AM-PAC PT "6 Clicks" Daily Activity  Outcome Measure Difficulty turning over in bed (including adjusting bedclothes, sheets and blankets)?: Unable Difficulty moving from lying on back to sitting on the side of the bed? : Unable Difficulty sitting down on and standing up from a chair with arms (e.g., wheelchair, bedside commode, etc,.)?: Unable Help needed moving to and from a bed to chair (including a wheelchair)?: Total Help needed walking in hospital room?: Total Help needed climbing 3-5 steps with a railing? : Total 6 Click Score: 6    End of Session Equipment Utilized During Treatment: Gait belt Activity Tolerance: Patient tolerated treatment well Patient left: in chair;with call bell/phone within reach;with chair alarm set;with family/visitor present Nurse Communication: Mobility status PT Visit Diagnosis: Unsteadiness on feet (R26.81);Other abnormalities of gait and mobility (R26.89);Muscle weakness (generalized) (M62.81)    Time: 1006-1030 PT Time Calculation (min) (ACUTE ONLY): 24 min   Charges:   PT Evaluation $PT Eval Moderate Complexity: 1 Mod     PT G Codes:        Kipp Laurence, PT, DPT 06/29/18 3:20 PM  Pager: [phone]

## 2018-06-30 ENCOUNTER — Other Ambulatory Visit: Payer: Self-pay

## 2018-06-30 DIAGNOSIS — E785 Hyperlipidemia, unspecified: Secondary | ICD-10-CM

## 2018-06-30 DIAGNOSIS — N390 Urinary tract infection, site not specified: Secondary | ICD-10-CM

## 2018-06-30 LAB — GLUCOSE, CAPILLARY
GLUCOSE-CAPILLARY: 208 mg/dL — AB (ref 70–99)
GLUCOSE-CAPILLARY: 193 mg/dL — AB (ref 70–99)
GLUCOSE-CAPILLARY: 215 mg/dL — AB (ref 70–99)
Glucose-Capillary: 144 mg/dL — ABNORMAL HIGH (ref 70–99)
Glucose-Capillary: 111 mg/dL — ABNORMAL HIGH (ref 70–99)
Glucose-Capillary: 189 mg/dL — ABNORMAL HIGH (ref 70–99)

## 2018-06-30 MED ORDER — ZOLPIDEM TARTRATE 5 MG PO TABS
5.0000 mg | ORAL_TABLET | Freq: Every evening | ORAL | Status: DC | PRN
  Administered 2018-06-30 – 2018-07-04 (×4): 5 mg via ORAL
  Filled 2018-06-30 (×4): qty 1

## 2018-06-30 NOTE — Progress Notes (Signed)
Speech Language Pathology Treatment: Dysphagia  Patient Details Name: Diana Hartman MRN: [id number] DOB: 10-06-1941 Today's Date: 06/30/2018 Time: 0850-0902 SLP Time Calculation (min) (ACUTE ONLY): 12 min  Assessment / Plan / Recommendation Clinical Impression  Pt intake listed as 85% and family confirms she has great po intake.  Pt is afebrile and on room air at this time.  SLP observed pt drooling - leaning toward right.  Repositioned pt in bed for more upright position.  Pt appeared to be masticating solids - therefore SLP used spoon for pt to reflexively open oral cavity to assess for residuals.  No residuals present  SLP also provided pt with intake of thin juice and applesauce.   No indication of airway compromise. Family also reports pt with minimal coughing without intake, possibly secretion aspiration.  Reviewed clinical indications of possible pna including fevers, congested cough and indications of aspiration for which to be aware.   Ms Schwanda will not allow family to brush her teeth per daughter.  Advised her to use toothette in lateral sulci on right to assure adequate clearance of foods.  Also advised consider placing flavor on toothbrush and encouraging pt to attempt to brush - hopefully transferring into brushing.  Reviewed importance of biofilm removal and pt's oral deficits.  Recommend continue diet with strict precautions.  Follow up at [facility] for dysphagia management.   Daughter agreeable to plan.    HPI HPI: pt is a 77 yo female adm to [facility] with right sided weakness.  Pt found to have left Interal capsule CVA, left frontal MCA impacting MCA also with severe temporal atrophy.  Swallow eval ordered as pt did not pass an RNSSS.  Daughter present and reports pt eats soft diet at home and is able to feed herself with her right hand.  She denies pt having any coughing associated with po intake.        SLP Plan  Continue with current plan of care       Recommendations  Diet  recommendations: Dysphagia 3 (mechanical soft);Thin liquid Liquids provided via: Straw Medication Administration: Crushed with puree Compensations: Slow rate;Small sips/bites;Lingual sweep for clearance of pocketing(use toothette prn to clear oral residuals) Postural Changes and/or Swallow Maneuvers: Upright 30-60 min after meal;Seated upright 90 degrees                Oral Care Recommendations: Oral care QID SLP Visit Diagnosis: Dysphagia, oral phase (R13.11) Plan: Continue with current plan of care       GO                Chales Abrahams 06/30/2018, 10:34 AM  Donavan Burnet, MS [facility] SLP [phone]

## 2018-06-30 NOTE — Progress Notes (Signed)
PROGRESS NOTE  Jamelle Haringda B Friday [id number] DOB: [birth year] DOA: 06/28/2018 [facility]   LOS: 2 days   Brief Narrative / Interim history: 77 year old female with history of dementia, hypertension, type 2 diabetes mellitus, who was brought to the hospital by patient's daughters due to right-sided weakness that started the day prior.  Symptoms did not improve overnight and decided to bring the patient to the hospital, she was diagnosed with a CVA and was admitted on 7/23.  Neurology was consulted.  Assessment & Plan: Active Problems:   CVA (cerebral vascular accident) (HCC)   Acute CVA -MRI on admission showed an acute left internal capsule nonhemorrhagic infarct, as well as 1 subacute to old left frontal infarct and old right frontal infarct. -Neurology consulted, appreciate input, discussed with Dr. Pearlean BrownieSethi today -For now recommending dual antiplatelet therapy for 3 weeks and followed by aspirin alone -2D echo showed normal EF 60 to 65%, no WMA -Lipid panel showed an LDL of 86, place on statin -Hemoglobin A1c was 6.3 -PT evaluation recommending SNF, discussed with the family at bedside today, all in agreement, discussed with Child psychotherapistsocial worker and will initiate SNF bed search today  Hypertension -Allow permissive hypertension now for 3 to 5 days  Type 2 diabetes mellitus -Placed on sliding scale, hold home oral agents, CBGs well-controlled, fasting this morning 111  Dementia/depression -Appears slightly worse than baseline with patient's being almost nonverbal  UTI -UA grossly positive, difficult to determine whether she has dysuria or any local symptoms, but would favor to get cultures and give her ceftriaxone for a few days in case this contributes to her worsening dementia -Urine cultures are pending today, based on sensitivities may be able to go to SNF on Friday   DVT prophylaxis: Lovenox Code Status: Full code Family Communication: Discussed with  daughters at bedside Disposition Plan: SNF 1 day once bed become available  Consultants:   Neurology   Procedures:   2D echo Study Conclusions - Left ventricle: The cavity size was normal. Systolic function was normal. The estimated ejection fraction was in the range of 60% to 65%. Wall motion was normal; there were no regional wall motion abnormalities. Doppler parameters are consistent with abnormal left ventricular relaxation (grade 1 diastolic dysfunction). - Atrial septum: No defect or patent foramen ovale was identified  Antimicrobials:  None    Subjective: -No distress, eating breakfast being fed, minimally verbal  Objective: Vitals:   06/29/18 2309 06/30/18 0329 06/30/18 0740 06/30/18 1217  BP: (!) 145/72 (!) 162/78 (!) 159/86 (!) 162/93  Pulse: 75 88 73 74  Resp:   18 17  Temp: 98.6 F (37 C) 98 F (36.7 C) 98.2 F (36.8 C) 98.4 F (36.9 C)  TempSrc: Oral Oral Axillary Axillary  SpO2: 97% 97% 99% 99%  Weight:      Height:        Intake/Output Summary (Last 24 hours) at 06/30/2018 1248 Last data filed at 06/30/2018 1217 Gross per 24 hour  Intake 480 ml  Output -  Net 480 ml   Filed Weights   06/28/18 1910  Weight: 63.2 kg (139 lb 5.3 oz)    Examination:  Constitutional: No apparent distress, eating breakfast Eyes: No scleral icterus Respiratory: Clear to auscultation bilaterally without wheezing or crackles.  Normal respiratory effort. Cardiovascular: Regular rate and rhythm, no murmurs heard.  No peripheral edema Abdomen: Soft, nontender, nondistended, bowel sounds positive.   Skin: No rashes seen Neurologic: Does not follow commands consistently, overall difficult exam  but does appear little bit weaker on the right   Data Reviewed: I have independently reviewed following labs and imaging studies   CBC: Recent Labs  Lab 06/28/18 1057 06/28/18 1106 06/29/18 0451  WBC 6.1  --  5.2  NEUTROABS 2.6  --   --   HGB 11.4* 12.2 11.2*  HCT 36.1  36.0 33.7*  MCV 92.6  --  88.9  PLT 160  --  161   Basic Metabolic Panel: Recent Labs  Lab 06/28/18 1057 06/28/18 1106 06/29/18 0451  NA 139 140 137  K 4.0 4.0 3.7  CL 105 104 103  CO2 28  --  26  GLUCOSE 69* 68* 127*  BUN 8 8 8   CREATININE 0.78 0.70 0.81  CALCIUM 9.1  --  9.0   GFR: Estimated Creatinine Clearance: 51.2 mL/min (by C-G formula based on SCr of 0.81 mg/dL). Liver Function Tests: Recent Labs  Lab 06/28/18 1057  AST 32  ALT 18  ALKPHOS 115  BILITOT 0.6  PROT 7.7  ALBUMIN 2.9*   No results for input(s): LIPASE, AMYLASE in the last 168 hours. No results for input(s): AMMONIA in the last 168 hours. Coagulation Profile: Recent Labs  Lab 06/28/18 1057  INR 1.14   Cardiac Enzymes: No results for input(s): CKTOTAL, CKMB, CKMBINDEX, TROPONINI in the last 168 hours. BNP (last 3 results) No results for input(s): PROBNP in the last 8760 hours. HbA1C: Recent Labs    06/28/18 1057  HGBA1C 6.3*   CBG: Recent Labs  Lab 06/29/18 1928 06/29/18 2351 06/30/18 0442 06/30/18 0739 06/30/18 1126  GLUCAP 244* 96 144* 111* 189*   Lipid Profile: Recent Labs    06/28/18 1057  CHOL 146  HDL 46  LDLCALC 86  TRIG 72  CHOLHDL 3.2   Thyroid Function Tests: No results for input(s): TSH, T4TOTAL, FREET4, T3FREE, THYROIDAB in the last 72 hours. Anemia Panel: No results for input(s): VITAMINB12, FOLATE, FERRITIN, TIBC, IRON, RETICCTPCT in the last 72 hours. Urine analysis:    Component Value Date/Time   COLORURINE AMBER (A) 06/29/2018 1017   APPEARANCEUR TURBID (A) 06/29/2018 1017   LABSPEC 1.003 (L) 06/29/2018 1017   PHURINE 7.0 06/29/2018 1017   GLUCOSEU 50 (A) 06/29/2018 1017   HGBUR MODERATE (A) 06/29/2018 1017   BILIRUBINUR NEGATIVE 06/29/2018 1017   KETONESUR NEGATIVE 06/29/2018 1017   PROTEINUR 100 (A) 06/29/2018 1017   UROBILINOGEN 1.0 07/04/2015 1220   NITRITE NEGATIVE 06/29/2018 1017   LEUKOCYTESUR LARGE (A) 06/29/2018 1017   Sepsis  Labs: Invalid input(s): PROCALCITONIN, LACTICIDVEN  No results found for this or any previous visit (from the past 240 hour(s)).    Radiology Studies: Mr Laqueta Jean Wo Contrast  Result Date: 06/28/2018 CLINICAL DATA:  Slurred speech, RIGHT upper extremity weakness. Last seen normal yesterday evening. History of hypertension, diabetes, temporal arteritis and dementia. EXAM: MRI HEAD WITHOUT AND WITH CONTRAST TECHNIQUE: Multiplanar, multiecho pulse sequences of the brain and surrounding structures were obtained without and with intravenous contrast. CONTRAST:  14mL MULTIHANCE GADOBENATE DIMEGLUMINE 529 MG/ML IV SOLN COMPARISON:  CT HEAD June 28, 2018.  MRI of the head Apr 18, 2012. FINDINGS: Multiple sequences are moderately motion degraded. INTRACRANIAL CONTENTS: 5 x 16 mm reduced diffusion LEFT posterior limb of the internal capsule with low ADC values. Bifrontal encephalomalacia with susceptibility artifact. Trace LEFT frontal cortical enhancement. No susceptibility artifact to suggest hemorrhage. Severe mesial temporal lobe atrophy with ex vacuo dilatation temporal horns, associated knife-like gyri. Generalized moderate to severe parenchymal brain  volume loss. No hydrocephalus. No midline shift, mass effect or masses. No abnormal extra-axial fluid collections. No suspicious intracranial enhancement. LEFT cerebellar developmental venous anomaly. VASCULAR: Normal major intracranial vascular flow voids present at skull base. SKULL AND UPPER CERVICAL SPINE: Empty sella. No suspicious calvarial bone marrow signal. Craniocervical junction maintained. SINUSES/ORBITS: The mastoid air-cells and included paranasal sinuses are well-aerated.The included ocular globes and orbital contents are non-suspicious. OTHER: None. IMPRESSION: 1. Moderately motion degraded examination. Acute 5 x 16 mm LEFT internal capsule nonhemorrhagic infarct. 2. Subacute to old LEFT frontal/MCA territory infarct. Old RIGHT frontal/MCA territory  infarct. 3. Severe temporal lobe atrophy seen with neurodegenerative syndromes. Electronically Signed   By: Awilda Metro M.D.   On: 06/28/2018 18:19     Scheduled Meds: . aspirin EC  81 mg Oral Daily  . atorvastatin  80 mg Oral q1800  . clopidogrel  75 mg Oral Daily  . enoxaparin (LOVENOX) injection  40 mg Subcutaneous Q24H  . insulin aspart  0-9 Units Subcutaneous Q4H   Continuous Infusions: . cefTRIAXone (ROCEPHIN)  IV 1 g (06/29/18 1556)    Pamella Pert, MD, PhD [facility] Pager [phone]  If 7PM-7AM, please contact night-coverage [email or web address] Password [facility] 06/30/2018, 12:48 PM

## 2018-07-01 LAB — GLUCOSE, CAPILLARY
GLUCOSE-CAPILLARY: 101 mg/dL — AB (ref 70–99)
Glucose-Capillary: 132 mg/dL — ABNORMAL HIGH (ref 70–99)
Glucose-Capillary: 213 mg/dL — ABNORMAL HIGH (ref 70–99)
Glucose-Capillary: 216 mg/dL — ABNORMAL HIGH (ref 70–99)

## 2018-07-01 LAB — URINE CULTURE

## 2018-07-01 LAB — BASIC METABOLIC PANEL
Anion gap: 7 (ref 5–15)
BUN: 15 mg/dL (ref 8–23)
CO2: 26 mmol/L (ref 22–32)
CREATININE: 0.79 mg/dL (ref 0.44–1.00)
Calcium: 8.7 mg/dL — ABNORMAL LOW (ref 8.9–10.3)
Chloride: 105 mmol/L (ref 98–111)
GFR calc non Af Amer: >60 mL/min (ref >60)
GLUCOSE: 117 mg/dL — AB (ref 70–99)
Potassium: 4.1 mmol/L (ref 3.5–5.1)
Sodium: 138 mmol/L (ref 135–145)

## 2018-07-01 LAB — CBC
HEMATOCRIT: 34.5 % — AB (ref 36.0–46.0)
Hemoglobin: 11.2 g/dL — ABNORMAL LOW (ref 12.0–15.0)
MCH: 29.5 pg (ref 26.0–34.0)
MCHC: 32.5 g/dL (ref 30.0–36.0)
MCV: 90.8 fL (ref 78.0–100.0)
Platelets: 144 10*3/uL — ABNORMAL LOW (ref 150–400)
RBC: 3.8 MIL/uL — ABNORMAL LOW (ref 3.87–5.11)
RDW: 14.3 % (ref 11.5–15.5)
WBC: 7.5 10*3/uL (ref 4.0–10.5)

## 2018-07-01 MED ORDER — DILTIAZEM HCL 30 MG PO TABS
120.0000 mg | ORAL_TABLET | Freq: Two times a day (BID) | ORAL | Status: DC
  Administered 2018-07-01 – 2018-07-05 (×8): 120 mg via ORAL
  Filled 2018-07-01 (×8): qty 4

## 2018-07-01 MED ORDER — SERTRALINE HCL 50 MG PO TABS
50.0000 mg | ORAL_TABLET | Freq: Every day | ORAL | Status: DC
  Administered 2018-07-01 – 2018-07-04 (×4): 50 mg via ORAL
  Filled 2018-07-01 (×4): qty 1

## 2018-07-01 MED ORDER — LISINOPRIL 20 MG PO TABS
40.0000 mg | ORAL_TABLET | Freq: Every day | ORAL | Status: DC
Start: 2018-07-01 — End: 2018-07-05
  Administered 2018-07-01 – 2018-07-05 (×5): 40 mg via ORAL
  Filled 2018-07-01 (×5): qty 2

## 2018-07-01 MED ORDER — DONEPEZIL HCL 10 MG PO TABS
10.0000 mg | ORAL_TABLET | Freq: Every day | ORAL | Status: DC
Start: 2018-07-01 — End: 2018-07-05
  Administered 2018-07-01 – 2018-07-05 (×5): 10 mg via ORAL
  Filled 2018-07-01 (×5): qty 1

## 2018-07-01 MED ORDER — NEBIVOLOL HCL 5 MG PO TABS
5.0000 mg | ORAL_TABLET | Freq: Every day | ORAL | Status: DC
  Administered 2018-07-03 – 2018-07-05 (×3): 5 mg via ORAL
  Filled 2018-07-01 (×4): qty 1

## 2018-07-01 MED ORDER — CEPHALEXIN 500 MG PO CAPS
500.0000 mg | ORAL_CAPSULE | Freq: Two times a day (BID) | ORAL | Status: AC
  Administered 2018-07-01 – 2018-07-03 (×6): 500 mg via ORAL
  Filled 2018-07-01 (×6): qty 1

## 2018-07-01 NOTE — NC FL2 (Signed)
MEDICAID FL2 LEVEL OF CARE SCREENING TOOL     IDENTIFICATION  Patient Name: Diana Hartman Birthdate: 01-12-1941 Sex: female Admission Date (Current Location): 06/28/2018  [facility] and [place] Number:  Producer, television/film/video and Address:  [facility], [address] [place], [place] [address]      Provider Number: [id number]  Attending Physician Name and Address:  Burnadette Pop, MD  Relative Name and Phone Number:       Current Level of Care: Hospital Recommended Level of Care: Skilled Nursing Facility Prior Approval Number:    Date Approved/Denied:   PASRR Number: Manual review  Discharge Plan: SNF    Current Diagnoses: Patient Active Problem List   Diagnosis Date Noted  . CVA (cerebral vascular accident) (HCC) 06/28/2018  . Gait disturbance 05/04/2018  . General weakness 05/04/2018  . Pain and swelling of knee 05/04/2018  . Protein-calorie malnutrition, severe 05/18/2016  . Lower urinary tract infectious disease 05/17/2016  . Hyperglycemia 05/17/2016  . Hypernatremia 05/17/2016  . Dehydration 05/17/2016  . Acute encephalopathy 05/17/2016  . Acute cystitis without hematuria   . Hyperglycemia without ketosis   . Insulin dependent diabetes mellitus (HCC)   . Hypoglycemia 07/04/2015  . Diabetes mellitus, type II (HCC) 07/04/2015  . Essential hypertension 07/04/2015  . Dementia without behavioral disturbance 07/04/2015  . Fall 07/04/2015    Orientation RESPIRATION BLADDER Height & Weight     Self  Normal Incontinent, Indwelling catheter Weight: 139 lb 5.3 oz (63.2 kg) Height:  5\' 3"  (160 cm)  BEHAVIORAL SYMPTOMS/MOOD NEUROLOGICAL BOWEL NUTRITION STATUS      Incontinent Diet(mechanical soft)  AMBULATORY STATUS COMMUNICATION OF NEEDS Skin   Extensive Assist Verbally Normal                       Personal Care Assistance Level of Assistance  Bathing, Feeding, Dressing Bathing Assistance: Maximum assistance Feeding  assistance: Maximum assistance Dressing Assistance: Maximum assistance     Functional Limitations Info  Sight, Hearing, Speech Sight Info: Adequate Hearing Info: Adequate Speech Info: Impaired(mute)    SPECIAL CARE FACTORS FREQUENCY  PT (By licensed PT), OT (By licensed OT), Speech therapy     PT Frequency: 5x/wk OT Frequency: 5x/wk     Speech Therapy Frequency: 5x/wk      Contractures Contractures Info: Not present    Additional Factors Info  Code Status, Allergies, Insulin Sliding Scale Code Status Info: Full Allergies Info: NKA   Insulin Sliding Scale Info: 0-9 units every 4 hours       Current Medications (07/01/2018):  This is the current hospital active medication list Current Facility-Administered Medications  Medication Dose Route Frequency Provider Last Rate Last Dose  . aspirin EC tablet 81 mg  81 mg Oral Daily Annie Main L, NP   81 mg at 07/01/18 0900  . atorvastatin (LIPITOR) tablet 80 mg  80 mg Oral q1800 Leatha Gilding, MD   80 mg at 06/30/18 1845  . cephALEXin (KEFLEX) capsule 500 mg  500 mg Oral Q12H Adhikari, Amrit, MD   500 mg at 07/01/18 0900  . clopidogrel (PLAVIX) tablet 75 mg  75 mg Oral Daily Annie Main L, NP   75 mg at 07/01/18 0900  . enoxaparin (LOVENOX) injection 40 mg  40 mg Subcutaneous Q24H Dow Adolph N, DO   40 mg at 06/30/18 2237  . hydrALAZINE (APRESOLINE) injection 10 mg  10 mg Intravenous Q6H PRN Dow Adolph N, DO      .  insulin aspart (novoLOG) injection 0-9 Units  0-9 Units Subcutaneous Q4H Hall, Carole N, DO   1 Units at 07/01/18 0900  . zolpidem (AMBIEN) tablet 5 mg  5 mg Oral QHS PRN Leda Gauze, NP   5 mg at 06/30/18 0037     Discharge Medications: Please see discharge summary for a list of discharge medications.  Relevant Imaging Results:  Relevant Lab Results:   Additional Information SS#: [id number]  Baldemar Lenis, LCSW

## 2018-07-01 NOTE — Care Management Important Message (Signed)
Important Message  Patient Details  Name: Diana Hartman MRN: [id number] Date of Birth: 1941/07/29   Medicare Important Message Given:  Yes    [name] 07/01/2018, 2:51 PM

## 2018-07-01 NOTE — Progress Notes (Signed)
Physical Therapy Treatment Patient Details Name: Diana Hartman MRN: [id number] DOB: 11-10-1941 Today's Date: 07/01/2018    History of Present Illness Patient is a 78 t/o female presenting with R sided weakness with associated slurred speech. CT head with no contrast revealed age indeterminate left frontal lobe infarct suggesting acute/subacute timeframe rather than chronic.  Also right frontal lobe infarct in a similar location smaller than the left.  And also age-indeterminate posterior left cerebellar infarct measuring up to 1.6 cm. MRI brain- internal capsule stroke on the left. PMH significant for dementia, hypertension, type 2 diabetes, chronic depression.     PT Comments    PT session today focusing on improving upright sitting balance, bed mobility, and functional transfers. Daughter present for session. Continues to require total A +2 for bed mobility and transfers. At times able to sit EOB with min guard, but otherwise requires Max A due to R lateral and posterior lean. Encouraged patient to track R with limited tolerance and maintenance. Will continue to progress as tolerated.     Follow Up Recommendations  SNF;Supervision/Assistance - 24 hour     Equipment Recommendations  (TBD at next venue of care)    Recommendations for Other Services OT consult;Speech consult     Precautions / Restrictions Precautions Precautions: Fall Restrictions Weight Bearing Restrictions: No    Mobility  Bed Mobility Overal bed mobility: Needs Assistance Bed Mobility: Supine to Sit     Supine to sit: Total assist;+2 for physical assistance;+2 for safety/equipment     General bed mobility comments: total A to come to EOB; requires Max A to min guard once sitting with R lateral lean; attempted to improved R sided gaze with difficulty  Transfers Overall transfer level: Needs assistance Equipment used: 2 person hand held assist Transfers: Sit to/from Stand;Stand Pivot Transfers Sit to  Stand: Total assist;+2 physical assistance Stand pivot transfers: Total assist;+2 physical assistance       General transfer comment: B knee blocking; reduced effort by patient  Ambulation/Gait                 Stairs             Wheelchair Mobility    Modified Rankin (Stroke Patients Only)       Balance Overall balance assessment: Needs assistance Sitting-balance support: Single extremity supported;Feet supported Sitting balance-Leahy Scale: Poor Sitting balance - Comments: ranging from min guard to max A for sitting EOB Postural control: Right lateral lean;Posterior lean                                  Cognition Arousal/Alertness: Awake/alert Behavior During Therapy: Flat affect Overall Cognitive Status: History of cognitive impairments - at baseline                                 General Comments: difficulty following commands; attmepted to increased R sided gaze wiht difficulty      Exercises      General Comments        Pertinent Vitals/Pain Pain Assessment: Faces Faces Pain Scale: No hurt    Home Living                      Prior Function            PT Goals (current goals can now be found in the  care plan section) Acute Rehab PT Goals Patient Stated Goal: none stated PT Goal Formulation: With patient/family Time For Goal Achievement: 07/13/18 Potential to Achieve Goals: Fair Progress towards PT goals: Progressing toward goals    Frequency    Min 2X/week      PT Plan Current plan remains appropriate    Co-evaluation              AM-PAC PT "6 Clicks" Daily Activity  Outcome Measure  Difficulty turning over in bed (including adjusting bedclothes, sheets and blankets)?: Unable Difficulty moving from lying on back to sitting on the side of the bed? : Unable Difficulty sitting down on and standing up from a chair with arms (e.g., wheelchair, bedside commode, etc,.)?: Unable Help  needed moving to and from a bed to chair (including a wheelchair)?: Total Help needed walking in hospital room?: Total Help needed climbing 3-5 steps with a railing? : Total 6 Click Score: 6    End of Session Equipment Utilized During Treatment: Gait belt Activity Tolerance: Patient tolerated treatment well Patient left: in chair;with call bell/phone within reach;with chair alarm set;with family/visitor present Nurse Communication: Mobility status PT Visit Diagnosis: Unsteadiness on feet (R26.81);Other abnormalities of gait and mobility (R26.89);Muscle weakness (generalized) (M62.81)     Time: 8295-6213 PT Time Calculation (min) (ACUTE ONLY): 25 min  Charges:  $Therapeutic Activity: 23-37 mins                    Kipp Laurence, PT, DPT 07/01/18 12:21 PM Pager: [phone]

## 2018-07-01 NOTE — Progress Notes (Signed)
PROGRESS NOTE    Diana Hartman  ZOX:[id number] DOB: 1941/10/02 DOA: 06/28/2018 PCP: [name]   Brief Narrative: Patient is a 77 year old female with past medical history of dementia, hypertension, diabetes type 2 who was brought to the hospital from home due to right-sided weakness.  She was diagnosed with CVA on this admission.  Neurology was following.  Currently she is waiting for skilled nursing facility bed.  Assessment & Plan:   Active Problems:   CVA (cerebral vascular accident) (HCC)  Acute CVA: MRI done here showed acute left internal capsule nonhemorrhagic infarct and 1 subacute to old left frontal infarct.  Neurology was following.  Neurology recommending dual antiplatelet therapy for 3 weeks followed by aspirin alone.  Echocardiogram showed normal ejection fraction of 60 to 65%, no wall motion abnormality.  Started on statin.  Hemoglobin A1c of 6.3. Patient evaluated by physical therapy and recommended skilled nursing facility.  Social worker working for that.  Urinary tract infection: Urine culture grew E. coli which was pansensitive.  Antibiotics changed to Keflex.  Patient is afebrile, no leucocytosis.  Hypertension:BP noted to be on the higher side. We will resume her home hypertensives.  Diabetes mellitus: Currently on sliding scale insulin.  Resume home medications on discharge.  Dementia/depression: Almost nonverbal.  Currently her mental status is on baseline.  On donepezil and Zoloft.  DVT prophylaxis: Lovenox Code Status: Full Family Communication: Discussed with daughter and son at the bedside Disposition Plan: Skilled nursing facility as soon as the bed is available   Consultants: Neurology  Procedures: None  Antimicrobials: None  Subjective: Patient seen and examined the bedside this morning.  Remains comfortable.  No new issues/events.  Objective: Vitals:   06/30/18 1945 07/01/18 0256 07/01/18 0754 07/01/18 1137  BP: (!)  161/94 (!) 149/73 (!) 154/69 (!) 170/77  Pulse: 94 83 73 65  Resp: 18 18 16 17   Temp: 98.2 F (36.8 C) 98.4 F (36.9 C) 98.6 F (37 C) 98.3 F (36.8 C)  TempSrc: Axillary Axillary Oral Oral  SpO2: 100% 99% 99% 99%  Weight:      Height:        Intake/Output Summary (Last 24 hours) at 07/01/2018 1603 Last data filed at 07/01/2018 0100 Gross per 24 hour  Intake -  Output 850 ml  Net -850 ml   Filed Weights   06/28/18 1910  Weight: 63.2 kg (139 lb 5.3 oz)    Examination:  General exam: Appears calm and comfortable ,Not in distress,average built HEENT:PERRL,Oral mucosa moist, Ear/Nose normal on gross exam Respiratory system: Bilateral equal air entry, normal vesicular breath sounds, no wheezes or crackles  Cardiovascular system: S1 & S2 heard, RRR. No JVD, murmurs, rubs, gallops or clicks. No pedal edema. Gastrointestinal system: Abdomen is nondistended, soft and nontender. No organomegaly or masses felt. Normal bowel sounds heard. Central nervous system: Alert and but not oriented.  Weakness on the right side. Extremities: No edema, no clubbing ,no cyanosis, distal peripheral pulses palpable. Skin: No rashes, lesions or ulcers,no icterus ,no pallor Psychiatry: Judgement and insight appear impaired   Data Reviewed: I have personally reviewed following labs and imaging studies  CBC: Recent Labs  Lab 06/28/18 1057 06/28/18 1106 06/29/18 0451 07/01/18 0343  WBC 6.1  --  5.2 7.5  NEUTROABS 2.6  --   --   --   HGB 11.4* 12.2 11.2* 11.2*  HCT 36.1 36.0 33.7* 34.5*  MCV 92.6  --  88.9 90.8  PLT 160  --  161 144*   Basic Metabolic Panel: Recent Labs  Lab 06/28/18 1057 06/28/18 1106 06/29/18 0451 07/01/18 0343  NA 139 140 137 138  K 4.0 4.0 3.7 4.1  CL 105 104 103 105  CO2 28  --  26 26  GLUCOSE 69* 68* 127* 117*  BUN 8 8 8 15   CREATININE 0.78 0.70 0.81 0.79  CALCIUM 9.1  --  9.0 8.7*   GFR: Estimated Creatinine Clearance: 51.9 mL/min (by C-G formula based on  SCr of 0.79 mg/dL). Liver Function Tests: Recent Labs  Lab 06/28/18 1057  AST 32  ALT 18  ALKPHOS 115  BILITOT 0.6  PROT 7.7  ALBUMIN 2.9*   No results for input(s): LIPASE, AMYLASE in the last 168 hours. No results for input(s): AMMONIA in the last 168 hours. Coagulation Profile: Recent Labs  Lab 06/28/18 1057  INR 1.14   Cardiac Enzymes: No results for input(s): CKTOTAL, CKMB, CKMBINDEX, TROPONINI in the last 168 hours. BNP (last 3 results) No results for input(s): PROBNP in the last 8760 hours. HbA1C: No results for input(s): HGBA1C in the last 72 hours. CBG: Recent Labs  Lab 06/30/18 1126 06/30/18 1659 06/30/18 2214 07/01/18 0752 07/01/18 1134  GLUCAP 189* 193* 215* 132* 213*   Lipid Profile: No results for input(s): CHOL, HDL, LDLCALC, TRIG, CHOLHDL, LDLDIRECT in the last 72 hours. Thyroid Function Tests: No results for input(s): TSH, T4TOTAL, FREET4, T3FREE, THYROIDAB in the last 72 hours. Anemia Panel: No results for input(s): VITAMINB12, FOLATE, FERRITIN, TIBC, IRON, RETICCTPCT in the last 72 hours. Sepsis Labs: No results for input(s): PROCALCITON, LATICACIDVEN in the last 168 hours.  Recent Results (from the past 240 hour(s))  Culture, Urine     Status: Abnormal   Collection Time: 06/29/18  4:28 PM  Result Value Ref Range Status   Specimen Description URINE, CLEAN CATCH  Final   Special Requests   Final    NONE Performed at [facility], [address], [place], [place] [address]    Culture >=100,000 COLONIES/mL ESCHERICHIA COLI (A)  Final   Report Status 07/01/2018 FINAL  Final   Organism ID, Bacteria ESCHERICHIA COLI (A)  Final      Susceptibility   Escherichia coli - MIC*    AMPICILLIN 8 SENSITIVE Sensitive     CEFAZOLIN <=4 SENSITIVE Sensitive     CEFTRIAXONE <=1 SENSITIVE Sensitive     CIPROFLOXACIN >=4 RESISTANT Resistant     GENTAMICIN <=1 SENSITIVE Sensitive     IMIPENEM <=0.25 SENSITIVE Sensitive     NITROFURANTOIN <=16  SENSITIVE Sensitive     TRIMETH/SULFA <=20 SENSITIVE Sensitive     AMPICILLIN/SULBACTAM 4 SENSITIVE Sensitive     PIP/TAZO <=4 SENSITIVE Sensitive     Extended ESBL NEGATIVE Sensitive     * >=100,000 COLONIES/mL ESCHERICHIA COLI         Radiology Studies: No results found.      Scheduled Meds: . aspirin EC  81 mg Oral Daily  . atorvastatin  80 mg Oral q1800  . cephALEXin  500 mg Oral Q12H  . clopidogrel  75 mg Oral Daily  . enoxaparin (LOVENOX) injection  40 mg Subcutaneous Q24H  . insulin aspart  0-9 Units Subcutaneous Q4H   Continuous Infusions:   LOS: 3 days    Time spent:35 mins. More than 50% of that time was spent in counseling and/or coordination of care.      Burnadette Pop, MD [facility] Pager [phone]  If 7PM-7AM, please contact night-coverage  [email or web address] Password [email or web address] 07/01/2018, 4:03 PM

## 2018-07-01 NOTE — Progress Notes (Signed)
CSW has not heard back from patient's family on choice of SNF. Patient's family will need to choose SNF before insurance authorization can be initiated; patient's family was aware. Patient's insurance does not work over the weekend, so authorization will not be obtained until Monday.  Patient also has manual review PASRR and PASRR has not yet been received. PASRR does not work over the weekend.   CSW to continue to follow.  Blenda NicelyElizabeth Canon [place], [place] Clinical Social Worker [phone]

## 2018-07-01 NOTE — Clinical Social Work Note (Signed)
Clinical Social Work Assessment  Patient Details  Name: Diana Hartman MRN: [id number] Date of Birth: 09/09/1941  Date of referral:  07/01/18               Reason for consult:  Facility Placement                Permission sought to share information with:  Facility Sport and exercise psychologist, Family Supports Permission granted to share information::  Yes, Verbal Permission Granted  Name::     [facility]  Agency::  SNF  Relationship::  Daughter, Son  Sport and exercise psychologist Information:     Housing/Transportation Living arrangements for the past 2 months:  Single Family Home Source of Information:  Adult Children, Medical Team Patient Interpreter Needed:  None Criminal Activity/Legal Involvement Pertinent to Current Situation/Hospitalization:  No - Comment as needed Significant Relationships:  Adult Children Lives with:  Self, Adult Children Do you feel safe going back to the place where you live?  Yes Need for family participation in patient care:  Yes (Comment)  Care giving concerns:  Patient from home with children but will need short term rehab at discharge prior to returning home with family support.   Social Worker assessment / plan:  CSW met with patient and patient's family at bedside to discuss recommendation for SNF. CSW provided list and discussed facility options. CSW provided number of CSW so that patient's family could call and provide update on facility choices. CSW to follow.  Employment status:  Retired Nurse, adult PT Recommendations:  [place] / Referral to community resources:  [place]  Patient/Family's Response to care:  Patient's family agreeable to SNF placement.  Patient/Family's Understanding of and Emotional Response to Diagnosis, Current Treatment, and Prognosis:  Patient's family discussed how they needed time to review the list and come up with options, because the only facilities they knew of  they were not happy with. Patient's family will review options and call CSW with decision.  Emotional Assessment Appearance:  Appears stated age Attitude/Demeanor/Rapport:  Unable to Assess Affect (typically observed):  Unable to Assess Orientation:  Oriented to Self Alcohol / Substance use:  Not Applicable Psych involvement (Current and /or in the community):  No (Comment)  Discharge Needs  Concerns to be addressed:  Care Coordination Readmission within the last 30 days:  No Current discharge risk:  Physical Impairment, Dependent with Mobility Barriers to Discharge:  Continued Medical Work up, Ship broker, Programmer, applications ([place])   Geralynn Ochs, LCSW 07/01/2018, 4:34 PM

## 2018-07-01 NOTE — Progress Notes (Signed)
I have been unable to assess neuro status or do NIHSS on patient this night. After several tries. Will continue to monitor patient.

## 2018-07-02 LAB — GLUCOSE, CAPILLARY
GLUCOSE-CAPILLARY: 158 mg/dL — AB (ref 70–99)
GLUCOSE-CAPILLARY: 276 mg/dL — AB (ref 70–99)
Glucose-Capillary: 130 mg/dL — ABNORMAL HIGH (ref 70–99)
Glucose-Capillary: 96 mg/dL (ref 70–99)

## 2018-07-02 NOTE — Clinical Social Work Note (Signed)
CSW spoke with pt's daughter via telephone. Pt's daughter was inquiring about CIR. CSW explained the specific qualifications and that pt would not qualify for a bed. Pt's daughter states she will talk to the MD. Pt's daughter also stated her sibling from out of town would be in Monday and they would go visit some SNF's. CSW expressed the importance of a decision early Monday morning because we will have to submit for [facility].  Velora MediateBridget Kimothy Kishimoto, MSW [phone]

## 2018-07-02 NOTE — Progress Notes (Signed)
Completed NIH testing result of 21 change is greater than NIH of 15 this morning.  Notified neuro Dr. Amada JupiterKirkpatrick, awaiting orders.

## 2018-07-02 NOTE — Progress Notes (Signed)
PROGRESS NOTE  Diana Hartman ZOX:[id number] DOB: Dec 31, 1940 DOA: 06/28/2018 PCP: [name]   LOS: 4 days   Brief Narrative / Interim history: 77 year old female with history of dementia, hypertension, type 2 diabetes mellitus, who was brought to the hospital by patient's daughters due to right-sided weakness that started the day prior.  Symptoms did not improve overnight and decided to bring the patient to the hospital, she was diagnosed with a CVA and was admitted on 7/23.  Neurology was consulted.  Assessment & Plan: Active Problems:   CVA (cerebral vascular accident) (HCC)   Acute CVA -MRI on admission showed an acute left internal capsule nonhemorrhagic infarct, as well as 1 subacute to old left frontal infarct and old right frontal infarct. -Neurology consulted, appreciate input, discussed with Dr. Pearlean Brownie today -For now recommending dual antiplatelet therapy for 3 weeks and followed by aspirin alone -2D echo showed normal EF 60 to 65%, no WMA -Lipid panel showed an LDL of 86, place on statin -Hemoglobin A1c was 6.3 -PT evaluation recommending SNF, awaiting placement   Hypertension -Resumed lisinopril, Bystolic, diltiazem, blood pressure stable this morning  Type 2 diabetes mellitus -CBGs well-controlled, fasting 158, continue current regimen  Dementia/depression -Still would not talk  UTI -Urine cultures speciated E. coli, changed to Keflex, continue for 2 more days   DVT prophylaxis: Lovenox Code Status: Full code Family Communication: No family present at bedside today Disposition Plan: SNF once bed becomes available  Consultants:   Neurology   Procedures:   2D echo Study Conclusions - Left ventricle: The cavity size was normal. Systolic function was normal. The estimated ejection fraction was in the range of 60% to 65%. Wall motion was normal; there were no regional wall motion abnormalities. Doppler parameters are consistent with  abnormal left ventricular relaxation (grade 1 diastolic dysfunction). - Atrial septum: No defect or patent foramen ovale was identified  Antimicrobials:  None    Subjective: -She appears comfortable, smiling, does not answer my questions.  Objective: Vitals:   07/01/18 2027 07/01/18 2305 07/02/18 0303 07/02/18 0806  BP: (!) 150/60 (!) 164/87 (!) 153/71 128/60  Pulse: 60 75 72 67  Resp: 18 18 16 18   Temp: 98.2 F (36.8 C) 97.8 F (36.6 C) 98.2 F (36.8 C) 98.2 F (36.8 C)  TempSrc: Oral Oral Oral Oral  SpO2: 100% 100% 99% 100%  Weight:      Height:        Intake/Output Summary (Last 24 hours) at 07/02/2018 0949 Last data filed at 07/02/2018 8119 Gross per 24 hour  Intake 240 ml  Output 400 ml  Net -160 ml   Filed Weights   06/28/18 1910  Weight: 63.2 kg (139 lb 5.3 oz)    Examination:  Constitutional: No distress, pleasantly demented Eyes: No scleral icterus, lids and conjunctivae normal Respiratory: Clear to auscultation bilaterally, no wheezing or crackles heart. Cardiovascular: Regular rate and rhythm, no murmurs.  No edema. Abdomen: Nontender, nondistended, positive bowel sounds Neurologic: Does not follow commands consistently but appears nonfocal overall   Data Reviewed: I have independently reviewed following labs and imaging studies   CBC: Recent Labs  Lab 06/28/18 1057 06/28/18 1106 06/29/18 0451 07/01/18 0343  WBC 6.1  --  5.2 7.5  NEUTROABS 2.6  --   --   --   HGB 11.4* 12.2 11.2* 11.2*  HCT 36.1 36.0 33.7* 34.5*  MCV 92.6  --  88.9 90.8  PLT 160  --  161 144*   Basic  Metabolic Panel: Recent Labs  Lab 06/28/18 1057 06/28/18 1106 06/29/18 0451 07/01/18 0343  NA 139 140 137 138  K 4.0 4.0 3.7 4.1  CL 105 104 103 105  CO2 28  --  26 26  GLUCOSE 69* 68* 127* 117*  BUN 8 8 8 15   CREATININE 0.78 0.70 0.81 0.79  CALCIUM 9.1  --  9.0 8.7*   GFR: Estimated Creatinine Clearance: 51.9 mL/min (by C-G formula based on SCr of 0.79  mg/dL). Liver Function Tests: Recent Labs  Lab 06/28/18 1057  AST 32  ALT 18  ALKPHOS 115  BILITOT 0.6  PROT 7.7  ALBUMIN 2.9*   No results for input(s): LIPASE, AMYLASE in the last 168 hours. No results for input(s): AMMONIA in the last 168 hours. Coagulation Profile: Recent Labs  Lab 06/28/18 1057  INR 1.14   Cardiac Enzymes: No results for input(s): CKTOTAL, CKMB, CKMBINDEX, TROPONINI in the last 168 hours. BNP (last 3 results) No results for input(s): PROBNP in the last 8760 hours. HbA1C: No results for input(s): HGBA1C in the last 72 hours. CBG: Recent Labs  Lab 07/01/18 0752 07/01/18 1134 07/01/18 1647 07/01/18 2110 07/02/18 0607  GLUCAP 132* 213* 101* 216* 158*   Lipid Profile: No results for input(s): CHOL, HDL, LDLCALC, TRIG, CHOLHDL, LDLDIRECT in the last 72 hours. Thyroid Function Tests: No results for input(s): TSH, T4TOTAL, FREET4, T3FREE, THYROIDAB in the last 72 hours. Anemia Panel: No results for input(s): VITAMINB12, FOLATE, FERRITIN, TIBC, IRON, RETICCTPCT in the last 72 hours. Urine analysis:    Component Value Date/Time   COLORURINE AMBER (A) 06/29/2018 1017   APPEARANCEUR TURBID (A) 06/29/2018 1017   LABSPEC 1.003 (L) 06/29/2018 1017   PHURINE 7.0 06/29/2018 1017   GLUCOSEU 50 (A) 06/29/2018 1017   HGBUR MODERATE (A) 06/29/2018 1017   BILIRUBINUR NEGATIVE 06/29/2018 1017   KETONESUR NEGATIVE 06/29/2018 1017   PROTEINUR 100 (A) 06/29/2018 1017   UROBILINOGEN 1.0 07/04/2015 1220   NITRITE NEGATIVE 06/29/2018 1017   LEUKOCYTESUR LARGE (A) 06/29/2018 1017   Sepsis Labs: Invalid input(s): PROCALCITONIN, LACTICIDVEN  Recent Results (from the past 240 hour(s))  Culture, Urine     Status: Abnormal   Collection Time: 06/29/18  4:28 PM  Result Value Ref Range Status   Specimen Description URINE, CLEAN CATCH  Final   Special Requests   Final    NONE Performed at [facility], [address], [place], [place] [address]    Culture  >=100,000 COLONIES/mL ESCHERICHIA COLI (A)  Final   Report Status 07/01/2018 FINAL  Final   Organism ID, Bacteria ESCHERICHIA COLI (A)  Final      Susceptibility   Escherichia coli - MIC*    AMPICILLIN 8 SENSITIVE Sensitive     CEFAZOLIN <=4 SENSITIVE Sensitive     CEFTRIAXONE <=1 SENSITIVE Sensitive     CIPROFLOXACIN >=4 RESISTANT Resistant     GENTAMICIN <=1 SENSITIVE Sensitive     IMIPENEM <=0.25 SENSITIVE Sensitive     NITROFURANTOIN <=16 SENSITIVE Sensitive     TRIMETH/SULFA <=20 SENSITIVE Sensitive     AMPICILLIN/SULBACTAM 4 SENSITIVE Sensitive     PIP/TAZO <=4 SENSITIVE Sensitive     Extended ESBL NEGATIVE Sensitive     * >=100,000 COLONIES/mL ESCHERICHIA COLI      Radiology Studies: No results found.   Scheduled Meds: . aspirin EC  81 mg Oral Daily  . atorvastatin  80 mg Oral q1800  . cephALEXin  500 mg Oral Q12H  . clopidogrel  75 mg Oral Daily  . diltiazem  120 mg Oral BID  . donepezil  10 mg Oral Daily  . enoxaparin (LOVENOX) injection  40 mg Subcutaneous Q24H  . insulin aspart  0-9 Units Subcutaneous Q4H  . lisinopril  40 mg Oral Daily  . nebivolol  5 mg Oral Daily  . sertraline  50 mg Oral QHS   Continuous Infusions:   Pamella Pert, MD, PhD [facility] Pager [phone]  If 7PM-7AM, please contact night-coverage [email or web address] Password [facility] 07/02/2018, 9:49 AM

## 2018-07-03 DIAGNOSIS — D696 Thrombocytopenia, unspecified: Secondary | ICD-10-CM

## 2018-07-03 DIAGNOSIS — F0390 Unspecified dementia without behavioral disturbance: Secondary | ICD-10-CM

## 2018-07-03 DIAGNOSIS — F329 Major depressive disorder, single episode, unspecified: Secondary | ICD-10-CM

## 2018-07-03 DIAGNOSIS — D62 Acute posthemorrhagic anemia: Secondary | ICD-10-CM

## 2018-07-03 DIAGNOSIS — F32A Depression, unspecified: Secondary | ICD-10-CM

## 2018-07-03 DIAGNOSIS — R001 Bradycardia, unspecified: Secondary | ICD-10-CM

## 2018-07-03 DIAGNOSIS — I639 Cerebral infarction, unspecified: Secondary | ICD-10-CM

## 2018-07-03 DIAGNOSIS — E119 Type 2 diabetes mellitus without complications: Secondary | ICD-10-CM

## 2018-07-03 DIAGNOSIS — I10 Essential (primary) hypertension: Secondary | ICD-10-CM

## 2018-07-03 LAB — GLUCOSE, CAPILLARY
GLUCOSE-CAPILLARY: 139 mg/dL — AB (ref 70–99)
GLUCOSE-CAPILLARY: 167 mg/dL — AB (ref 70–99)
GLUCOSE-CAPILLARY: 211 mg/dL — AB (ref 70–99)
GLUCOSE-CAPILLARY: 228 mg/dL — AB (ref 70–99)
Glucose-Capillary: 149 mg/dL — ABNORMAL HIGH (ref 70–99)

## 2018-07-03 NOTE — Progress Notes (Signed)
PROGRESS NOTE  Diana Hartman NUU:[id number] DOB: 11-26-1941 DOA: 06/28/2018 PCP: [name]   LOS: 5 days   Brief Narrative / Interim history: 77 year old female with history of dementia, hypertension, type 2 diabetes mellitus, who was brought to the hospital by patient's daughters due to right-sided weakness that started the day prior.  Symptoms did not improve overnight and decided to bring the patient to the hospital, she was diagnosed with a CVA and was admitted on 7/23.  Neurology was consulted.  Assessment & Plan: Active Problems:   CVA (cerebral vascular accident) [facility])   Acute CVA -patient was admitted to the hospital with worsening mentation per family.  MRI on admission showed an acute left internal capsule nonhemorrhagic infarct, as well as 1 subacute to old left frontal infarct and old right frontal infarct.  Neurology was consulted and followed patient while hospitalized.  She underwent stroke work-up with a 2D echo which showed normal EF 60 to 65%, no WMA.  Lipid panel showed an LDL of 86, she was placed on statin.  Her hemoglobin A1c was 6.3.  Neurology recommends dual antiplatelet therapy for 3 weeks followed by aspirin alone.  Physical therapy recommended SNF, placement is pending.  Family insisting about CIR, have requested a consult Hypertension -blood pressure stable, continue current regimen Type 2 diabetes mellitus -fasting CBG 149, continue current regimen Dementia/depression -is nonverbal for me UTI -Urine cultures speciated E. coli, changed to Keflex, 1 day left   DVT prophylaxis: Lovenox Code Status: Full code Family Communication: No family present at bedside today Disposition Plan: SNF once bed becomes available  Consultants:   Neurology   Procedures:   2D echo Study Conclusions - Left ventricle: The cavity size was normal. Systolic function was normal. The estimated ejection fraction was in the range of 60% to 65%. Wall motion was  normal; there were no regional wall motion abnormalities. Doppler parameters are consistent with abnormal left ventricular relaxation (grade 1 diastolic dysfunction). - Atrial septum: No defect or patent foramen ovale was identified  Antimicrobials:  None    Subjective: -She appears comfortable, alert, nonverbal  Objective: Vitals:   07/02/18 1934 07/03/18 0100 07/03/18 0500 07/03/18 0811  BP: (!) 145/67 140/66 139/65 (!) 152/61  Pulse: 62 64 66 61  Resp:  19 20 18   Temp: (!) 97.4 F (36.3 C) 98.6 F (37 C) 98.4 F (36.9 C) 97.6 F (36.4 C)  TempSrc: Oral Oral Oral Oral  SpO2: 97%  100% 99%  Weight:      Height:        Intake/Output Summary (Last 24 hours) at 07/03/2018 1038 Last data filed at 07/03/2018 0500 Gross per 24 hour  Intake 660 ml  Output 660 ml  Net 0 ml   Filed Weights   06/28/18 1910  Weight: 63.2 kg (139 lb 5.3 oz)    Examination:  Constitutional: She is in no distress, pleasantly demented Eyes: No scleral icterus Respiratory: Clear to auscultation bilaterally without wheezing or crackles.  Moves air well. Cardiovascular: Regular rate and rhythm, no murmurs heard.  No JVD.  No peripheral edema Abdomen: Soft, nontender, nondistended, positive bowel sounds Neurologic: Does not follow commands consistently but appears nonfocal overall   Data Reviewed: I have independently reviewed following labs and imaging studies   CBC: Recent Labs  Lab 06/28/18 1057 06/28/18 1106 06/29/18 0451 07/01/18 0343  WBC 6.1  --  5.2 7.5  NEUTROABS 2.6  --   --   --   HGB 11.4*  12.2 11.2* 11.2*  HCT 36.1 36.0 33.7* 34.5*  MCV 92.6  --  88.9 90.8  PLT 160  --  161 144*   Basic Metabolic Panel: Recent Labs  Lab 06/28/18 1057 06/28/18 1106 06/29/18 0451 07/01/18 0343  NA 139 140 137 138  K 4.0 4.0 3.7 4.1  CL 105 104 103 105  CO2 28  --  26 26  GLUCOSE 69* 68* 127* 117*  BUN 8 8 8 15   CREATININE 0.78 0.70 0.81 0.79  CALCIUM 9.1  --  9.0 8.7*    GFR: Estimated Creatinine Clearance: 51.9 mL/min (by C-G formula based on SCr of 0.79 mg/dL). Liver Function Tests: Recent Labs  Lab 06/28/18 1057  AST 32  ALT 18  ALKPHOS 115  BILITOT 0.6  PROT 7.7  ALBUMIN 2.9*   No results for input(s): LIPASE, AMYLASE in the last 168 hours. No results for input(s): AMMONIA in the last 168 hours. Coagulation Profile: Recent Labs  Lab 06/28/18 1057  INR 1.14   Cardiac Enzymes: No results for input(s): CKTOTAL, CKMB, CKMBINDEX, TROPONINI in the last 168 hours. BNP (last 3 results) No results for input(s): PROBNP in the last 8760 hours. HbA1C: No results for input(s): HGBA1C in the last 72 hours. CBG: Recent Labs  Lab 07/02/18 1151 07/02/18 1651 07/02/18 2018 07/03/18 0122 07/03/18 0537  GLUCAP 130* 276* 96 139* 149*   Lipid Profile: No results for input(s): CHOL, HDL, LDLCALC, TRIG, CHOLHDL, LDLDIRECT in the last 72 hours. Thyroid Function Tests: No results for input(s): TSH, T4TOTAL, FREET4, T3FREE, THYROIDAB in the last 72 hours. Anemia Panel: No results for input(s): VITAMINB12, FOLATE, FERRITIN, TIBC, IRON, RETICCTPCT in the last 72 hours. Urine analysis:    Component Value Date/Time   COLORURINE AMBER (A) 06/29/2018 1017   APPEARANCEUR TURBID (A) 06/29/2018 1017   LABSPEC 1.003 (L) 06/29/2018 1017   PHURINE 7.0 06/29/2018 1017   GLUCOSEU 50 (A) 06/29/2018 1017   HGBUR MODERATE (A) 06/29/2018 1017   BILIRUBINUR NEGATIVE 06/29/2018 1017   KETONESUR NEGATIVE 06/29/2018 1017   PROTEINUR 100 (A) 06/29/2018 1017   UROBILINOGEN 1.0 07/04/2015 1220   NITRITE NEGATIVE 06/29/2018 1017   LEUKOCYTESUR LARGE (A) 06/29/2018 1017   Sepsis Labs: Invalid input(s): PROCALCITONIN, LACTICIDVEN  Recent Results (from the past 240 hour(s))  Culture, Urine     Status: Abnormal   Collection Time: 06/29/18  4:28 PM  Result Value Ref Range Status   Specimen Description URINE, CLEAN CATCH  Final   Special Requests   Final     NONE Performed at [facility], [address] [place], [place] [address]    Culture >=100,000 COLONIES/mL ESCHERICHIA COLI (A)  Final   Report Status 07/01/2018 FINAL  Final   Organism ID, Bacteria ESCHERICHIA COLI (A)  Final      Susceptibility   Escherichia coli - MIC*    AMPICILLIN 8 SENSITIVE Sensitive     CEFAZOLIN <=4 SENSITIVE Sensitive     CEFTRIAXONE <=1 SENSITIVE Sensitive     CIPROFLOXACIN >=4 RESISTANT Resistant     GENTAMICIN <=1 SENSITIVE Sensitive     IMIPENEM <=0.25 SENSITIVE Sensitive     NITROFURANTOIN <=16 SENSITIVE Sensitive     TRIMETH/SULFA <=20 SENSITIVE Sensitive     AMPICILLIN/SULBACTAM 4 SENSITIVE Sensitive     PIP/TAZO <=4 SENSITIVE Sensitive     Extended ESBL NEGATIVE Sensitive     * >=100,000 COLONIES/mL ESCHERICHIA COLI      Radiology Studies: No results found.   Scheduled  Meds: . aspirin EC  81 mg Oral Daily  . atorvastatin  80 mg Oral q1800  . cephALEXin  500 mg Oral Q12H  . clopidogrel  75 mg Oral Daily  . diltiazem  120 mg Oral BID  . donepezil  10 mg Oral Daily  . enoxaparin (LOVENOX) injection  40 mg Subcutaneous Q24H  . insulin aspart  0-9 Units Subcutaneous Q4H  . lisinopril  40 mg Oral Daily  . nebivolol  5 mg Oral Daily  . sertraline  50 mg Oral QHS   Continuous Infusions:   Pamella Pert, MD, PhD [facility] Pager [phone]  If 7PM-7AM, please contact night-coverage [email or web address] Password [facility] 07/03/2018, 10:38 AM

## 2018-07-03 NOTE — Consult Note (Addendum)
Physical Medicine and Rehabilitation Consult Reason for Consult: Stroke Referring Physician: Leatha Gilding, MD   HPI: Diana Hartman is a 77 y.o. female with past medical history of dementia, diabetes mellitus type 2, hypertension, temporal arteritis, depression presented on 06/28/2018 with stroke.  History taken from chart reviewed.  Patient began to have symptoms on the night of 7/22 with right-sided weakness.  She was brought to the ED.  CT head reviewed, showing bilateral infarcts.  Per report, acute/subacute left frontal lobe infarct, right frontal lobe infarct, and left cerebellar infarcts.  Neurology was consulted.  MRI was ordered, which showed acute left internal capsule nonhemorrhagic infarct, subacute/old left frontal/PCA infarct, old right frontal/MCA infarct, severe temporal lobe atrophy.  Hospital course further complicated by thrombocytopenia, acute blood loss anemia, bradycardia.   Review of Systems  Unable to perform ROS: Medical condition   Past Medical History:  Diagnosis Date  . Dementia   . Diabetes mellitus   . Hypertension   . Temporal arteritis [facility])    Past Surgical History:  Procedure Laterality Date  . NO PAST SURGERIES     Family History  Problem Relation Age of Onset  . Diabetes type II Other   . Hypertension Other    Social History:  reports that she has never smoked. She has never used smokeless tobacco. She reports that she does not drink alcohol or use drugs. Allergies: No Known Allergies Medications Prior to Admission  Medication Sig Dispense Refill  . acetaminophen (TYLENOL) 160 MG/5ML elixir Take 480 mg/kg by mouth every 4 (four) hours as needed for fever.    . Calcium Carbonate-Vitamin D (CALCIUM 600+D) 600-200 MG-UNIT TABS Take 1 tablet by mouth daily.    Marland Kitchen diltiazem (CARDIZEM) 120 MG tablet Take 120 mg by mouth 2 (two) times daily.     Marland Kitchen donepezil (ARICEPT) 10 MG tablet Take 10 mg by mouth daily.    Marland Kitchen glipiZIDE (GLUCOTROL) 10 MG  tablet Take 10 mg by mouth daily.  1  . GLUCERNA (GLUCERNA) LIQD Take 237 mLs by mouth 2 (two) times daily between meals. (Patient taking differently: Take 237 mLs by mouth as needed. ) 30 Can 0  . insulin detemir (LEVEMIR) 100 UNIT/ML injection Inject 0.1 mLs (10 Units total) into the skin 2 (two) times daily. 10 mL 11  . Iron-Vitamins (GERITOL) LIQD Take 5 mLs by mouth every other day.     . lisinopril (PRINIVIL,ZESTRIL) 40 MG tablet Take 40 mg by mouth daily.      . nebivolol (BYSTOLIC) 5 MG tablet Take 5 mg by mouth daily.    . sertraline (ZOLOFT) 50 MG tablet Take 50 mg by mouth at bedtime.     . traMADol (ULTRAM) 50 MG tablet Take 1 tablet (50 mg total) by mouth every 6 (six) hours as needed. 10 tablet 0  . zolpidem (AMBIEN) 10 MG tablet Take 10 mg by mouth at bedtime as needed.   5  . acetaminophen (TYLENOL) 325 MG tablet Take 2 tablets (650 mg total) by mouth every 6 (six) hours as needed for mild pain (or Fever >/= 101). (Patient not taking: Reported on 06/28/2018) 30 tablet 0    Home: Home Living Family/patient expects to be discharged to:: Private residence Living Arrangements: Children Available Help at Discharge: Family, Available 24 hours/day Type of Home: House Home Access: Level entry Home Layout: One level Bathroom Shower/Tub: (sponge bath) Bathroom Toilet: Standard Home Equipment: Cane - single point, Environmental consultant - 2 wheels  Additional Comments: daughterx2 present for home questions. pt with no verbalizations this sesion  Functional History: Prior Function Level of Independence: Needs assistance Gait / Transfers Assistance Needed: pt (A) from bed , pt (A) with basic transfer and using RW to move from room to room  ADL's / Homemaking Assistance Needed: pt requires total (A) for bathing and dressing. pt was able to static stand for daughters to complete hygiene throughout the peri care. pt could self feed all meals Communication / Swallowing Assistance Needed: pt was  communicating and family as able to understand wants/ needs. daughter reports several attempts to communicate this admission but unable to understand Functional Status:  Mobility: Bed Mobility Overal bed mobility: Needs Assistance Bed Mobility: Supine to Sit Rolling: Total assist, +2 for physical assistance Supine to sit: Total assist, +2 for physical assistance, +2 for safety/equipment General bed mobility comments: total A to come to EOB; requires Max A to min guard once sitting with R lateral lean; attempted to improved R sided gaze with difficulty Transfers Overall transfer level: Needs assistance Equipment used: 2 person hand held assist Transfers: Sit to/from Stand, Stand Pivot Transfers Sit to Stand: Total assist, +2 physical assistance Stand pivot transfers: Total assist, +2 physical assistance General transfer comment: B knee blocking; reduced effort by patient      ADL: ADL Overall ADL's : Needs assistance/impaired Eating/Feeding: Total assistance Grooming: Total assistance Upper Body Bathing: Total assistance Lower Body Bathing: Total assistance Upper Body Dressing : Total assistance Lower Body Dressing: Total assistance Toilet Transfer: Total assistance, +2 for physical assistance, +2 for safety/equipment, BSC Toilet Transfer Details (indicate cue type and reason): simulated bed to chair  Toileting- Clothing Manipulation and Hygiene: Total assistance Toileting - Clothing Manipulation Details (indicate cue type and reason): pt incontinent on arrival and unaware. pt voiding bowel and bladder prior to start of session with total (A) for hygiene. pt does reach for therapist with peri care in response to tactile input from wash cloth General ADL Comments: pt at this time is total (A) for all care. Daughter reports patient must be one person (A) level to return home and be able to complete basic transfer 1 person  Cognition: Cognition Overall Cognitive Status: History of  cognitive impairments - at baseline Orientation Level: Disoriented X4 Cognition Arousal/Alertness: Awake/alert Behavior During Therapy: Flat affect Overall Cognitive Status: History of cognitive impairments - at baseline General Comments: difficulty following commands; attmepted to increased R sided gaze wiht difficulty  Blood pressure (!) 145/60, pulse (!) 54, temperature 97.9 F (36.6 C), temperature source Oral, resp. rate 18, height 5\' 3"  (1.6 m), weight 63.2 kg (139 lb 5.3 oz), SpO2 99 %. Physical Exam  Vitals reviewed. Constitutional: She appears well-developed and well-nourished.  HENT:  Head: Normocephalic and atraumatic.  Eyes: Right eye exhibits no discharge. Left eye exhibits no discharge. No scleral icterus.  Neck: Normal range of motion. Neck supple.  Cardiovascular: Normal rate and regular rhythm.  Respiratory: Effort normal and breath sounds normal.  GI: Soft. Bowel sounds are normal.  Musculoskeletal:  No edema or tenderness in extremities  Neurological: She is alert.  Unable to assess sensation and MMT due to lack of participation.  Patient with blank stare, not following commands.  Skin: Skin is warm and dry.  Psychiatric: Her affect is blunt. Her speech is delayed. She is slowed. Cognition and memory are impaired. She expresses inappropriate judgment. She is noncommunicative.  Blank stare She is inattentive.    Results for orders placed or  performed during the hospital encounter of 06/28/18 (from the past 24 hour(s))  Glucose, capillary     Status: Abnormal   Collection Time: 07/02/18  4:51 PM  Result Value Ref Range   Glucose-Capillary 276 (H) 70 - 99 mg/dL  Glucose, capillary     Status: None   Collection Time: 07/02/18  8:18 PM  Result Value Ref Range   Glucose-Capillary 96 70 - 99 mg/dL  Glucose, capillary     Status: Abnormal   Collection Time: 07/03/18  1:22 AM  Result Value Ref Range   Glucose-Capillary 139 (H) 70 - 99 mg/dL  Glucose, capillary      Status: Abnormal   Collection Time: 07/03/18  5:37 AM  Result Value Ref Range   Glucose-Capillary 149 (H) 70 - 99 mg/dL  Glucose, capillary     Status: Abnormal   Collection Time: 07/03/18 11:18 AM  Result Value Ref Range   Glucose-Capillary 167 (H) 70 - 99 mg/dL   No results found.  Assessment/Plan: Diagnosis: Left internal capsule infarct with history of CVA Labs and images independently reviewed.  Records reviewed and summated above. Stroke: Continue secondary stroke prophylaxis and Risk Factor Modification listed below:   Antiplatelet therapy:   Blood Pressure Management:  Continue current medication with prn's with permisive HTN per primary team Statin Agent:   Diabetes management:   ?Right sided hemiparesis: fit for orthosis to prevent contractures (resting hand splint for day, wrist cock up splint at night, PRAFO, etc) Motor recovery: Fluoxetine  1. Does the need for close, 24 hr/day medical supervision in concert with the patient's rehab needs make it unreasonable for this patient to be served in a less intensive setting? Yes  2. Co-Morbidities requiring supervision/potential complications: bradycardia (monitor heart with increased physical activity), Thrombocytopenia (< 60,000/mm3 no resistive exercise), acute blood loss anemia (transfuse if necessary to ensure appropriate perfusion for increased activity tolerance), dementia (meds as necessary), diabetes mellitus type 2 (Monitor in accordance with exercise and adjust meds as necessary), HTN (monitor and provide prns in accordance with increased physical exertion and pain), temporal arteritis, depression (ensure mood does not hinder progress of therapies) 3. Due to bladder management, bowel management, safety, skin/wound care, disease management, medication administration, pain management and patient education, does the patient require 24 hr/day rehab nursing? Yes 4. Does the patient require coordinated care of a physician, rehab  nurse, PT (1-2 hrs/day, 5 days/week), OT (1-2 hrs/day, 5 days/week) and SLP (1-2 hrs/day, 5 days/week) to address physical and functional deficits in the context of the above medical diagnosis(es)? Yes Addressing deficits in the following areas: balance, endurance, locomotion, strength, transferring, bowel/bladder control, bathing, dressing, feeding, grooming, toileting, cognition, speech, language, swallowing and psychosocial support 5. Can the patient actively participate in an intensive therapy program of at least 3 hrs of therapy per day at least 5 days per week? No 6. The potential for patient to make measurable gains while on inpatient rehab is good and fair 7. Anticipated functional outcomes upon discharge from inpatient rehab are mod assist and max assist  with PT, mod assist and max assist with OT, max assist and total assist with SLP. 8. Estimated rehab length of stay to reach the above functional goals is: 35-40 days. 9. Anticipated D/C setting: SNF 10. Anticipated post D/C treatments: SNF 11. Overall Rehab/Functional Prognosis: fair  RECOMMENDATIONS: This patient's condition is appropriate for continued rehabilitative care in the following setting: Question ability to tolerate 3 hours of therapy per day.  Patient also with  dementia and question ability to carryover tasks..  At this time recommend SNF with PM&R follow-up. Patient has agreed to participate in recommended program. Potentially Note that insurance prior authorization may be required for reimbursement for recommended care.  Comment: Rehab Admissions Coordinator to follow up.  Maryla MorrowAnkit Avannah Decker, MD, [facility] 07/03/2018

## 2018-07-04 LAB — GLUCOSE, CAPILLARY
GLUCOSE-CAPILLARY: 192 mg/dL — AB (ref 70–99)
GLUCOSE-CAPILLARY: 189 mg/dL — AB (ref 70–99)
Glucose-Capillary: 207 mg/dL — ABNORMAL HIGH (ref 70–99)
Glucose-Capillary: 147 mg/dL — ABNORMAL HIGH (ref 70–99)
Glucose-Capillary: 402 mg/dL — ABNORMAL HIGH (ref 70–99)
Glucose-Capillary: 307 mg/dL — ABNORMAL HIGH (ref 70–99)
Glucose-Capillary: 307 mg/dL — ABNORMAL HIGH (ref 70–99)
Glucose-Capillary: 196 mg/dL — ABNORMAL HIGH (ref 70–99)
Glucose-Capillary: 201 mg/dL — ABNORMAL HIGH (ref 70–99)

## 2018-07-04 MED ORDER — ATORVASTATIN CALCIUM 80 MG PO TABS: 80.0000 mg | ORAL_TABLET | Freq: Every day | ORAL | Status: AC

## 2018-07-04 MED ORDER — INSULIN DETEMIR 100 UNIT/ML ~~LOC~~ SOLN
5.0000 [IU] | Freq: Two times a day (BID) | SUBCUTANEOUS | Status: DC
  Administered 2018-07-04 – 2018-07-05 (×3): 5 [IU] via SUBCUTANEOUS
  Filled 2018-07-04 (×4): qty 0.05

## 2018-07-04 MED ORDER — INSULIN ASPART 100 UNIT/ML ~~LOC~~ SOLN
0.0000 [IU] | Freq: Three times a day (TID) | SUBCUTANEOUS | Status: DC
  Administered 2018-07-05 (×2): 2 [IU] via SUBCUTANEOUS

## 2018-07-04 MED ORDER — TRAMADOL HCL 50 MG PO TABS: 50.0000 mg | ORAL_TABLET | Freq: Four times a day (QID) | ORAL | 0 refills | Status: DC | PRN

## 2018-07-04 MED ORDER — CLOPIDOGREL BISULFATE 75 MG PO TABS: 75.0000 mg | ORAL_TABLET | Freq: Every day | ORAL | Status: AC

## 2018-07-04 MED ORDER — ASPIRIN 81 MG PO TBEC: 81.0000 mg | DELAYED_RELEASE_TABLET | Freq: Every day | ORAL | Status: AC

## 2018-07-04 NOTE — Progress Notes (Signed)
Physical Therapy Treatment Patient Details Name: Diana Hartman MRN: [id number] DOB: [birth year] Today's Date: 07/04/2018    History of Present Illness Patient is a [age] t/o female presenting with R sided weakness with associated slurred speech. CT head with no contrast revealed age indeterminate left frontal lobe infarct suggesting acute/subacute timeframe rather than chronic.  Also right frontal lobe infarct in a similar location smaller than the left.  And also age-indeterminate posterior left cerebellar infarct measuring up to 1.6 cm. MRI brain- internal capsule stroke on the left. PMH significant for dementia, hypertension, type 2 diabetes, chronic depression.     PT Comments    Patient seen for mobility progression. Pt requires total A for supine to sit and max A +2 for functional transfers. Pt continues to demonstrate L gaze preference and with little purposeful movements with cues. Continue to progress as tolerated with anticipated d/c to SNF for further skilled PT services.     Follow Up Recommendations  SNF;Supervision/Assistance - 24 hour     Equipment Recommendations  (TBD at next venue of care)    Recommendations for Other Services OT consult;Speech consult     Precautions / Restrictions Precautions Precautions: Fall Restrictions Weight Bearing Restrictions: No    Mobility  Bed Mobility Overal bed mobility: Needs Assistance Bed Mobility: Supine to Sit     Supine to sit: Total assist;HOB elevated     General bed mobility comments: assistance required for all aspects of bed mobility with use of bed pad   Transfers Overall transfer level: Needs assistance Equipment used: 2 person hand held assist Transfers: Sit to/from Stand;Stand Pivot Transfers Sit to Stand: +2 physical assistance;Max assist;From elevated surface Stand pivot transfers: +2 physical assistance;Max assist       General transfer comment: sit to stands X2 from EOB and recliner; multimodal cues  for hand placement and sequencing; assistance required to power up into standing and for balance in standing  Ambulation/Gait                 Stairs             Wheelchair Mobility    Modified Rankin (Stroke Patients Only) Modified Rankin (Stroke Patients Only) Pre-Morbid Rankin Score: Moderately severe disability Modified Rankin: Severe disability     Balance Overall balance assessment: Needs assistance Sitting-balance support: Single extremity supported;Feet supported Sitting balance-Leahy Scale: Poor Sitting balance - Comments: pt initally required max A for sitting balance due to R lateral and posterior lean but progressed to min guard assist for safety  Postural control: Right lateral lean;Posterior lean Standing balance support: Bilateral upper extremity supported Standing balance-Leahy Scale: Zero                              Cognition Arousal/Alertness: Awake/alert Behavior During Therapy: Flat affect Overall Cognitive Status: History of cognitive impairments - at baseline                                        Exercises      General Comments General comments (skin integrity, edema, etc.): total A for pericare in standing       Pertinent Vitals/Pain Pain Assessment: Faces Faces Pain Scale: No hurt Pain Intervention(s): Monitored during session    Home Living  Prior Function            PT Goals (current goals can now be found in the care plan section) Acute Rehab PT Goals Patient Stated Goal: none stated PT Goal Formulation: With patient/family Time For Goal Achievement: 07/13/18 Potential to Achieve Goals: Fair Progress towards PT goals: Progressing toward goals    Frequency    Min 2X/week      PT Plan Current plan remains appropriate    Co-evaluation              AM-PAC PT "6 Clicks" Daily Activity  Outcome Measure  Difficulty turning over in bed (including  adjusting bedclothes, sheets and blankets)?: Unable Difficulty moving from lying on back to sitting on the side of the bed? : Unable Difficulty sitting down on and standing up from a chair with arms (e.g., wheelchair, bedside commode, etc,.)?: Unable Help needed moving to and from a bed to chair (including a wheelchair)?: Total Help needed walking in hospital room?: Total Help needed climbing 3-5 steps with a railing? : Total 6 Click Score: 6    End of Session Equipment Utilized During Treatment: Gait belt Activity Tolerance: Patient tolerated treatment well Patient left: in chair;with call bell/phone within reach;with chair alarm set Nurse Communication: Mobility status PT Visit Diagnosis: Unsteadiness on feet (R26.81);Other abnormalities of gait and mobility (R26.89);Muscle weakness (generalized) (M62.81)     Time: 1610-9604 PT Time Calculation (min) (ACUTE ONLY): 25 min  Charges:  $Therapeutic Activity: 23-37 mins                     Erline Levine, PTA Pager: [phone]     Carolynne Edouard 07/04/2018, 1:12 PM

## 2018-07-04 NOTE — Progress Notes (Signed)
CSW called at 11:15 and left a voicemail for patient's daughter to discuss SNF options; no return call. CSW called again and spoke with patient's daughter, Stanton KidneyDebra, who said that she is still waiting for her other sister to agree on [facility]. Stanton KidneyDebra indicated that all three of the patient's children need to agree on a decision before it's made, that's how they make decisions together. Per Stanton Kidneyebra, she and her brother are in agreement with [facility], but awaiting for her sister's decision. CS[facility] and that they can initiate insurance authorization request in the mean time.   CSW to continue to follow.  Blenda NicelyElizabeth Mallary Kreger, [place] Clinical Social Worker [phone]

## 2018-07-04 NOTE — Progress Notes (Signed)
  Speech Language Pathology Treatment: Dysphagia  Patient Details Name: Diana Hartman MRN: [id number] DOB: [birth year] Today's Date: 07/04/2018 Time: 4696-2952 SLP Time Calculation (min) (ACUTE ONLY): 9 min  Assessment / Plan / Recommendation Clinical Impression  ST following pt for dysphagia (speech-language assessment was not ordered). Observed pt with Dys 3, recommended during initial eval). No verbal responses to questions or follow simple commands (open mouth etc). Observed with Dys 3 texture with functional mastication and appeared to use tongue to check for pocketing. No s/s aspiration with solid or straw sips thin. No family present. Pt appears safe and efficient with continuing Dys 3 (mechanical soft diet), thin and intermittent supervision especially after meals to ensure oral cavity is clear. No further ST needed for dysphagia. She may benefit from order for speech-language-cognition- please order if desired in hospital or she can have assessment in next venue of care (this therapist is not familiar with pt's baseline status).   HPI HPI: pt is a 77 yo female adm to [facility] with right sided weakness.  Pt found to have left Interal capsule CVA, left frontal MCA impacting MCA also with severe temporal atrophy.  Swallow eval ordered as pt did not pass an RNSSS.  Daughter present and reports pt eats soft diet at home and is able to feed herself with her right hand.  She denies pt having any coughing associated with po intake.        SLP Plan  All goals met;Discharge SLP treatment due to (comment)       Recommendations  Diet recommendations: Dysphagia 3 (mechanical soft);Thin liquid Liquids provided via: Straw Medication Administration: Crushed with puree Supervision: Full supervision/cueing for compensatory strategies Compensations: Slow rate;Small sips/bites;Lingual sweep for clearance of pocketing Postural Changes and/or Swallow Maneuvers: Upright 30-60 min after meal;Seated upright 90  degrees                Oral Care Recommendations: Oral care BID Follow up Recommendations: None SLP Visit Diagnosis: Dysphagia, oral phase (R13.11) Plan: All goals met;Discharge SLP treatment due to (comment)       GO                Houston Siren 07/04/2018, 1:59 PM   Orbie Pyo Colvin Caroli.Ed [employer] [phone]

## 2018-07-04 NOTE — Progress Notes (Signed)
Pt CBG 402, gave 9 unit insulin, will recheck in 1 hr

## 2018-07-04 NOTE — Progress Notes (Signed)
Rechecked pt's CBG blood sugar still elevated. MD notified, Diet changed to Carb mod. Levemir ordered for now

## 2018-07-04 NOTE — Discharge Summary (Addendum)
Physician Discharge Summary  [name]:[id number] DOB: [birth year] DOA: 06/28/2018  PCP: Diana Hartman  Admit date: 06/28/2018 Discharge date: 07/05/2018  Admitted From: home Disposition:  SNF  Seen and examined today 7/30.  No changes, clinically stable, ready for discharge.  For full DC summary see below note from 7/29  [name] M. Diana Lennox, MD [facility]  Recommendations for Outpatient Follow-up:  1. Follow up with PCP in 1-2 weeks 2. Follow-up with neurology in 6 weeks 3. Continue dual antiplatelet therapy with aspirin and Plavix for 3 weeks, followed by aspirin alone.  Home Health: none Equipment/Devices: none  Discharge Condition: stable CODE STATUS: Full code Diet recommendation: heart healthy  HPI: Per [name], Diana Hartman is a 77 y.o. female with Hartman history significant for dementia, hypertension, type 2 diabetes, chronic depression who presented to ED [facility] with right-sided weakness that started yesterday evening.  History is obtained from ED physician and her [name].  Last seen normal yesterday evening around 2000.  Family noted right upper extremity weakness and unable to use her right hand to feed herself (Lives with her daughter who is also her Hartman POA).  Symptoms were not improving and this morning her right-sided weakness persisted.  They also noted slurred speech.  And decided to call EMS. ED Course: Upon presentation to the ED, CT head with no contrast revealed age indeterminate left frontal lobe infarct suggesting acute/subacute timeframe rather than chronic.  Also right frontal lobe infarct in a similar location smaller than the left.  And also age-indeterminate posterior left cerebellar infarct measuring up to 1.6 cm.  Discussed CODE STATUS with daughter who is also Hartman power of attorney stated her mother is a full code. Neurology consulted by ED physician.   Hospital Course: Acute CVA -patient was  admitted to the hospital with worsening mentation per family.  MRI on admission showed an acute left internal capsule nonhemorrhagic infarct, as well as 1 subacute to old left frontal infarct and old right frontal infarct.  Neurology was consulted and followed patient while hospitalized.  She underwent stroke work-up with a 2D echo which showed normal EF 60 to 65%, no WMA.  Lipid panel showed an LDL of 86, she was placed on statin.  Her hemoglobin A1c was 6.3.  Neurology recommends dual antiplatelet therapy for 3 weeks followed by aspirin alone.   Hypertension -blood pressure stable, continue current regimen Type 2 diabetes mellitus -continue home regimen Dementia/depression -is nonverbal for me UTI -Urine cultures speciated E. Coli sensitive to Keflex, finished treatment while hospitalized.  Discharge Diagnoses:  Active Problems:   CVA (cerebral vascular accident) (HCC)   Ischemic stroke (HCC)   Bradycardia   Thrombocytopenia (HCC)   Acute blood loss anemia   Diabetes mellitus type 2 in nonobese [facility])   Benign essential HTN   Depression     Discharge Instructions  Discharge Instructions    Ambulatory referral to Neurology   Complete by:  As directed    Follow up with stroke clinic NP ([name] or [name], if both not available, consider [name], [name], or [name]) at [facility] in about 4 weeks.     Allergies as of 07/05/2018   No Known Allergies     Medication List    TAKE these medications   acetaminophen 160 MG/5ML elixir Commonly known as:  TYLENOL Take 480 mg/kg by mouth every 4 (four) hours as needed for fever.  acetaminophen 325 MG tablet Commonly known as:  TYLENOL Take 2 tablets (650 mg total) by mouth every 6 (six) hours as needed for mild pain (or Fever >/= 101).   aspirin 81 MG EC tablet Take 1 tablet (81 mg total) by mouth daily.   atorvastatin 80 MG tablet Commonly known as:   LIPITOR Take 1 tablet (80 mg total) by mouth daily at 6 PM.   CALCIUM 600+D 600-200 MG-UNIT Tabs Generic drug:  Calcium Carbonate-Vitamin D Take 1 tablet by mouth daily.   clopidogrel 75 MG tablet Commonly known as:  PLAVIX Take 1 tablet (75 mg total) by mouth daily.   diltiazem 120 MG tablet Commonly known as:  CARDIZEM Take 120 mg by mouth 2 (two) times daily.   donepezil 10 MG tablet Commonly known as:  ARICEPT Take 10 mg by mouth daily.   Geritol Liqd Take 5 mLs by mouth every other day.   glipiZIDE 10 MG tablet Commonly known as:  GLUCOTROL Take 10 mg by mouth daily.   GLUCERNA Liqd Take 237 mLs by mouth 2 (two) times daily between meals. What changed:    when to take this  reasons to take this   insulin detemir 100 UNIT/ML injection Commonly known as:  LEVEMIR Inject 0.1 mLs (10 Units total) into the skin 2 (two) times daily.   lisinopril 40 MG tablet Commonly known as:  PRINIVIL,ZESTRIL Take 40 mg by mouth daily.   nebivolol 5 MG tablet Commonly known as:  BYSTOLIC Take 5 mg by mouth daily.   sertraline 50 MG tablet Commonly known as:  ZOLOFT Take 50 mg by mouth at bedtime.   traMADol 50 MG tablet Commonly known as:  ULTRAM Take 1 tablet (50 mg total) by mouth every 6 (six) hours as needed.   zolpidem 10 MG tablet Commonly known as:  AMBIEN Take 10 mg by mouth at bedtime as needed.      Follow-up Information    [facility] Follow up in 4 week(s).   Specialty:  Neurology Why:  Stroke clinic.  Office will call with appointment date and time. Contact information: [address] [place] [address] [phone]          Consultations:  Neurology   Procedures/Studies:  2D echo Study Conclusions - Left ventricle: The cavity size was normal. Systolic function wasnormal. The estimated ejection fraction was in the range of 60%to 65%. Wall motion was normal; there were no regional wallmotion  abnormalities. Doppler parameters are consistent withabnormal left ventricular relaxation (grade 1 diastolicdysfunction). - Atrial septum: No defect or patent foramen ovale was identified  Ct Head Wo Contrast  Result Date: 06/28/2018 CLINICAL DATA:  Focal neuro deficit, greater than 6 hours, stroke suspected. Left seen normal at 5 o'clock p.m. yesterday. Abnormal speech. Right-sided facial droop. EXAM: CT HEAD WITHOUT CONTRAST TECHNIQUE: Contiguous axial images were obtained from the base of the skull through the vertex without intravenous contrast. COMPARISON:  CT head without contrast 12/19/2015 and 07/04/2015. FINDINGS: Brain: A left frontal cortical infarct involves the left frontal operculum and inferior left frontal gyrus. This is new from the prior exam and suspected to be acute or subacute. No acute infarct is evident along the precentral gyrus. A smaller right anterior frontal infarct is present in a similar location, also new since the prior exam. Atrophy and white matter changes are otherwise stable. Ventricles are proportionate to the degree of atrophy. No significant extra-axial fluid collection is present. A posterior left cerebellar  infarct is new since the prior exam. The brain stem and cerebellum are otherwise unremarkable. Vascular: Atherosclerotic calcifications are present within the cavernous internal carotid arteries bilaterally. There is no hyperdense vessel. Skull: The calvarium is intact. No focal lytic or blastic lesions are present. Sinuses/Orbits: The paranasal sinuses and mastoid air cells are clear. Bilateral lens replacements are present. Globes and orbits are otherwise within normal limits. IMPRESSION: 1. Age indeterminate left frontal lobe infarct may be acute. This is new since the prior exam and hyperdense to CSF, suggesting acute/subacute time frame rather than chronic. This could correlate with the patient's acute symptoms. 2. Right frontal lobe infarct in a similar  location is smaller than on the left. This is also new since the prior study. 3. Age indeterminate posterior left cerebellar infarct measures up to 1.6 cm. 4. Otherwise stable atrophy and white matter disease likely reflecting the sequela of chronic microvascular ischemia. Electronically Signed   By: Marin Roberts M.D.   On: 06/28/2018 12:12   Mr Laqueta Jean ZO Contrast  Result Date: 06/28/2018 CLINICAL DATA:  Slurred speech, RIGHT upper extremity weakness. Last seen normal yesterday evening. History of hypertension, diabetes, temporal arteritis and dementia. EXAM: MRI HEAD WITHOUT AND WITH CONTRAST TECHNIQUE: Multiplanar, multiecho pulse sequences of the brain and surrounding structures were obtained without and with intravenous contrast. CONTRAST:  14mL MULTIHANCE GADOBENATE DIMEGLUMINE 529 MG/ML IV SOLN COMPARISON:  CT HEAD June 28, 2018.  MRI of the head Apr 18, 2012. FINDINGS: Multiple sequences are moderately motion degraded. INTRACRANIAL CONTENTS: 5 x 16 mm reduced diffusion LEFT posterior limb of the internal capsule with low ADC values. Bifrontal encephalomalacia with susceptibility artifact. Trace LEFT frontal cortical enhancement. No susceptibility artifact to suggest hemorrhage. Severe mesial temporal lobe atrophy with ex vacuo dilatation temporal horns, associated knife-like gyri. Generalized moderate to severe parenchymal brain volume loss. No hydrocephalus. No midline shift, mass effect or masses. No abnormal extra-axial fluid collections. No suspicious intracranial enhancement. LEFT cerebellar developmental venous anomaly. VASCULAR: Normal major intracranial vascular flow voids present at skull base. SKULL AND UPPER CERVICAL SPINE: Empty sella. No suspicious calvarial bone marrow signal. Craniocervical junction maintained. SINUSES/ORBITS: The mastoid air-cells and included paranasal sinuses are well-aerated.The included ocular globes and orbital contents are non-suspicious. OTHER: None.  IMPRESSION: 1. Moderately motion degraded examination. Acute 5 x 16 mm LEFT internal capsule nonhemorrhagic infarct. 2. Subacute to old LEFT frontal/MCA territory infarct. Old RIGHT frontal/MCA territory infarct. 3. Severe temporal lobe atrophy seen with neurodegenerative syndromes. Electronically Signed   By: Awilda Metro M.D.   On: 06/28/2018 18:19     Subjective: -nonverbal, appears comfortable  Discharge Exam: Vitals:   07/05/18 0310 07/05/18 0900  BP: (!) 127/42 (!) 112/52  Pulse: 60 63  Resp: 16 17  Temp: 98.8 F (37.1 C) 98.3 F (36.8 C)  SpO2: 96% 97%    General: Pt is alert, awake, not in acute distress Cardiovascular: RRR, S1/S2 +, no rubs, no gallops Respiratory: CTA bilaterally, no wheezing, no rhonchi   The results of significant diagnostics from this hospitalization (including imaging, microbiology, ancillary and laboratory) are listed below for reference.     Microbiology: Recent Results (from the past 240 hour(s))  Culture, Urine     Status: Abnormal   Collection Time: 06/29/18  4:28 PM  Result Value Ref Range Status   Specimen Description URINE, CLEAN CATCH  Final   Special Requests   Final    NONE Performed at [facility], [address]  [place], [place] [address]    Culture >=100,000 COLONIES/mL ESCHERICHIA COLI (A)  Final   Report Status 07/01/2018 FINAL  Final   Organism ID, Bacteria ESCHERICHIA COLI (A)  Final      Susceptibility   Escherichia coli - MIC*    AMPICILLIN 8 SENSITIVE Sensitive     CEFAZOLIN <=4 SENSITIVE Sensitive     CEFTRIAXONE <=1 SENSITIVE Sensitive     CIPROFLOXACIN >=4 RESISTANT Resistant     GENTAMICIN <=1 SENSITIVE Sensitive     IMIPENEM <=0.25 SENSITIVE Sensitive     NITROFURANTOIN <=16 SENSITIVE Sensitive     TRIMETH/SULFA <=20 SENSITIVE Sensitive     AMPICILLIN/SULBACTAM 4 SENSITIVE Sensitive     PIP/TAZO <=4 SENSITIVE Sensitive     Extended ESBL NEGATIVE Sensitive     * >=100,000 COLONIES/mL ESCHERICHIA  COLI     Labs: BNP (last 3 results) No results for input(s): BNP in the last 8760 hours. Basic Metabolic Panel: Recent Labs  Lab 06/28/18 1057 06/28/18 1106 06/29/18 0451 07/01/18 0343  NA 139 140 137 138  K 4.0 4.0 3.7 4.1  CL 105 104 103 105  CO2 28  --  26 26  GLUCOSE 69* 68* 127* 117*  BUN 8 8 8 15   CREATININE 0.78 0.70 0.81 0.79  CALCIUM 9.1  --  9.0 8.7*   Liver Function Tests: Recent Labs  Lab 06/28/18 1057  AST 32  ALT 18  ALKPHOS 115  BILITOT 0.6  PROT 7.7  ALBUMIN 2.9*   No results for input(s): LIPASE, AMYLASE in the last 168 hours. No results for input(s): AMMONIA in the last 168 hours. CBC: Recent Labs  Lab 06/28/18 1057 06/28/18 1106 06/29/18 0451 07/01/18 0343  WBC 6.1  --  5.2 7.5  NEUTROABS 2.6  --   --   --   HGB 11.4* 12.2 11.2* 11.2*  HCT 36.1 36.0 33.7* 34.5*  MCV 92.6  --  88.9 90.8  PLT 160  --  161 144*   Cardiac Enzymes: No results for input(s): CKTOTAL, CKMB, CKMBINDEX, TROPONINI in the last 168 hours. BNP: Invalid input(s): POCBNP CBG: Recent Labs  Lab 07/04/18 1706 07/04/18 2026 07/04/18 2139 07/04/18 2252 07/05/18 0616  GLUCAP 189* 307* 196* 201* 162*   D-Dimer No results for input(s): DDIMER in the last 72 hours. Hgb A1c No results for input(s): HGBA1C in the last 72 hours. Lipid Profile No results for input(s): CHOL, HDL, LDLCALC, TRIG, CHOLHDL, LDLDIRECT in the last 72 hours. Thyroid function studies No results for input(s): TSH, T4TOTAL, T3FREE, THYROIDAB in the last 72 hours.  Invalid input(s): FREET3 Anemia work up No results for input(s): VITAMINB12, FOLATE, FERRITIN, TIBC, IRON, RETICCTPCT in the last 72 hours. Urinalysis    Component Value Date/Time   COLORURINE AMBER (A) 06/29/2018 1017   APPEARANCEUR TURBID (A) 06/29/2018 1017   LABSPEC 1.003 (L) 06/29/2018 1017   PHURINE 7.0 06/29/2018 1017   GLUCOSEU 50 (A) 06/29/2018 1017   HGBUR MODERATE (A) 06/29/2018 1017   BILIRUBINUR NEGATIVE 06/29/2018  1017   KETONESUR NEGATIVE 06/29/2018 1017   PROTEINUR 100 (A) 06/29/2018 1017   UROBILINOGEN 1.0 07/04/2015 1220   NITRITE NEGATIVE 06/29/2018 1017   LEUKOCYTESUR LARGE (A) 06/29/2018 1017   Sepsis Labs Invalid input(s): PROCALCITONIN,  WBC,  LACTICIDVEN   Time coordinating discharge: 40 minutes  SIGNED:  Pamella Pert, MD  [facility] 07/05/2018, 10:29 AM Pager [phone]  If 7PM-7AM, please contact night-coverage [email or web address] Password [email or web address]

## 2018-07-04 NOTE — Care Management Note (Signed)
Case Management Note  Patient Details  Name: Diana Hartman MRN: [id number] Date of Birth: 10/06/1941  Subjective/Objective:       Pt admitted with CVA. She is from home.              Action/Plan: Recommendations are for SNF. CM following for d/c disposition.  Expected Discharge Date:                  Expected Discharge Plan:  Skilled Nursing Facility  In-House Referral:  Clinical Social Work  Discharge planning Services     Post Acute Care Choice:    Choice offered to:     DME Arranged:    DME Agency:     HH Arranged:    HH Agency:     Status of Service:  In process, will continue to follow  If discussed at Long Length of Stay Meetings, dates discussed:    Additional Comments:  Kermit BaloKelli F Kipp Shank, RN 07/04/2018, 2:58 PM

## 2018-07-04 NOTE — Progress Notes (Signed)
Inpatient Rehabilitation-Admissions Coordinator   Met with patient at the bedside as follow up from PM&R consult. Due to cognitive state, AC discussed recommendations with her daughter over the phone (and son in person). Family is aware that the pt is not a candidate for CIR at this time due to poor activity tolerance and carry over of skills. AC has contacted SW/CM regarding recommendation for SNF placement. CIR will sign off at this time. Please call for questions.   Jhonnie Garner, OTR/L  Rehab Admissions Coordinator  [phone] 07/04/2018 11:54 AM

## 2018-07-04 NOTE — Progress Notes (Signed)
Inpatient Diabetes Program Recommendations  AACE/[place]: New Consensus Statement on Inpatient Glycemic Control (2019)  Target Ranges:  Prepandial:   less than 140 mg/dL      Peak postprandial:   less than 180 mg/dL (1-2 hours)      Critically ill patients:  140 - 180 mg/dL   Results for Diana Hartman, Diana Hartman (MRN [id number]) as of 07/04/2018 10:48  Ref. Range 07/03/2018 05:37 07/03/2018 11:18 07/03/2018 16:09 07/03/2018 20:01 07/04/2018 01:46 07/04/2018 03:54 07/04/2018 08:52  Glucose-Capillary Latest Ref Range: 70 - 99 mg/dL 409149 (H) 811167 (H) 914211 (H) 228 (H) 207 (H) 147 (H) 192 (H)   Review of Glycemic Control  Diabetes history: DM2 Outpatient Diabetes medications: Glipizide 10 mg daily, Levemir 10 units BID Current orders for Inpatient glycemic control: Novolog 0-9 units Q4H  Inpatient Diabetes Program Recommendations: Insulin - Basal: Please consider ordering Lantus 5 units Q24H. Correction (SSI): If patient is eating, please consider changing CBGs and Novolog correction to ACHS.  Thanks, Orlando PennerMarie Murray Durrell, RN, MSN, CDE Diabetes Coordinator Inpatient Diabetes Program [phone] (Team Pager from 8am to 5pm)

## 2018-07-05 DIAGNOSIS — R488 Other symbolic dysfunctions: Secondary | ICD-10-CM | POA: Diagnosis not present

## 2018-07-05 DIAGNOSIS — I69351 Hemiplegia and hemiparesis following cerebral infarction affecting right dominant side: Secondary | ICD-10-CM | POA: Diagnosis not present

## 2018-07-05 DIAGNOSIS — I69320 Aphasia following cerebral infarction: Secondary | ICD-10-CM | POA: Diagnosis not present

## 2018-07-05 DIAGNOSIS — R2689 Other abnormalities of gait and mobility: Secondary | ICD-10-CM | POA: Diagnosis not present

## 2018-07-05 DIAGNOSIS — R1312 Dysphagia, oropharyngeal phase: Secondary | ICD-10-CM | POA: Diagnosis not present

## 2018-07-05 DIAGNOSIS — Z7401 Bed confinement status: Secondary | ICD-10-CM | POA: Diagnosis not present

## 2018-07-05 DIAGNOSIS — D696 Thrombocytopenia, unspecified: Secondary | ICD-10-CM | POA: Diagnosis not present

## 2018-07-05 DIAGNOSIS — I10 Essential (primary) hypertension: Secondary | ICD-10-CM | POA: Diagnosis not present

## 2018-07-05 DIAGNOSIS — R531 Weakness: Secondary | ICD-10-CM | POA: Diagnosis not present

## 2018-07-05 DIAGNOSIS — I6340 Cerebral infarction due to embolism of unspecified cerebral artery: Secondary | ICD-10-CM | POA: Diagnosis not present

## 2018-07-05 DIAGNOSIS — R278 Other lack of coordination: Secondary | ICD-10-CM | POA: Diagnosis not present

## 2018-07-05 DIAGNOSIS — E46 Unspecified protein-calorie malnutrition: Secondary | ICD-10-CM | POA: Diagnosis not present

## 2018-07-05 DIAGNOSIS — M2550 Pain in unspecified joint: Secondary | ICD-10-CM | POA: Diagnosis not present

## 2018-07-05 DIAGNOSIS — D649 Anemia, unspecified: Secondary | ICD-10-CM | POA: Diagnosis not present

## 2018-07-05 DIAGNOSIS — Z794 Long term (current) use of insulin: Secondary | ICD-10-CM | POA: Diagnosis not present

## 2018-07-05 DIAGNOSIS — E119 Type 2 diabetes mellitus without complications: Secondary | ICD-10-CM | POA: Diagnosis not present

## 2018-07-05 DIAGNOSIS — I6320 Cerebral infarction due to unspecified occlusion or stenosis of unspecified precerebral arteries: Secondary | ICD-10-CM | POA: Diagnosis not present

## 2018-07-05 DIAGNOSIS — I63512 Cerebral infarction due to unspecified occlusion or stenosis of left middle cerebral artery: Secondary | ICD-10-CM | POA: Diagnosis not present

## 2018-07-05 DIAGNOSIS — M6281 Muscle weakness (generalized): Secondary | ICD-10-CM | POA: Diagnosis not present

## 2018-07-05 DIAGNOSIS — R638 Other symptoms and signs concerning food and fluid intake: Secondary | ICD-10-CM | POA: Diagnosis not present

## 2018-07-05 DIAGNOSIS — I639 Cerebral infarction, unspecified: Secondary | ICD-10-CM | POA: Diagnosis not present

## 2018-07-05 DIAGNOSIS — Z7689 Persons encountering health services in other specified circumstances: Secondary | ICD-10-CM | POA: Diagnosis not present

## 2018-07-05 DIAGNOSIS — E785 Hyperlipidemia, unspecified: Secondary | ICD-10-CM | POA: Diagnosis not present

## 2018-07-05 DIAGNOSIS — R001 Bradycardia, unspecified: Secondary | ICD-10-CM | POA: Diagnosis not present

## 2018-07-05 LAB — GLUCOSE, CAPILLARY
Glucose-Capillary: 162 mg/dL — ABNORMAL HIGH (ref 70–99)
Glucose-Capillary: 169 mg/dL — ABNORMAL HIGH (ref 70–99)

## 2018-07-05 NOTE — Progress Notes (Signed)
CSW received voicemail from patient's daughter, Stanton KidneyDebra, after hours last night that all of the patient's children are in agreement for [facility]. CSW notified by [facility] that the patient has received insurance authorization to admit to SNF today.  CSW to follow to coordinate discharge.  Blenda NicelyElizabeth Bari Handshoe, [place] Clinical Social Worker [phone]

## 2018-07-05 NOTE — Care Management Important Message (Signed)
Important Message  Patient Details  Name: Diana Hartman MRN: [id number] Date of Birth: October 24, 1941   Medicare Important Message Given:  Yes    Bernadette HoitShoffner, Lainie Daubert Coleman 07/05/2018, 11:32 AM

## 2018-07-05 NOTE — Consult Note (Signed)
[facility] CM Primary Care Navigator  07/05/2018  Diana Hartman 05/09/1941 [id number]   Went to see patient in the roomto identify possible discharge needs butshe wasalreadydischarge perRN report.  Patientwas discharged to skilled nursing facility per therapy recommendation ([facility]).  Per chart review,patientpresented with right-sided weakness- unable to use herright hand to feed herself and noted slurred speech. (acute CVA)  Primary care provider's office is listed as providing transition of care (TOC) follow-up.  Patient has discharge instruction to follow-up withprimary care provider in 1- 2 weeks and neurology follow-up in 4 weeks.   For additional questions please contact:  Karin Golden A. Minas Bonser, BSN, RN-BC [facility] PRIMARY CARE Navigator Cell: [phone]

## 2018-07-05 NOTE — Progress Notes (Signed)
RN called to give report to [facility], No one answered the phone,. RN called back twice and spoke to BOB who transferred RN to a line that was not answered. RN will try to call back again before PT AR transfers the patient. [facility] [address]. Pt is stable with no new concerns.

## 2018-07-05 NOTE — Care Management Note (Signed)
Case Management Note  Patient Details  Name: Diana Hartman MRN: [id number] Date of Birth: Jan 23, 1941  Subjective/Objective:                    Action/Plan: Pt discharging to [facility] today. CM signing off.    Expected Discharge Date:  07/05/18               Expected Discharge Plan:  Skilled Nursing Facility  In-House Referral:  Clinical Social Work  Discharge planning Services     Post Acute Care Choice:    Choice offered to:     DME Arranged:    DME Agency:     HH Arranged:    HH Agency:     Status of Service:  Completed, signed off  If discussed at [employer] [facility] [employer] Meetings, dates discussed:    Additional Comments:  Kermit BaloKelli F Rainn Zupko, RN 07/05/2018, 11:22 AM

## 2018-07-05 NOTE — Clinical Social Work Placement (Signed)
Nurse to call report to [phone], [facility]      CLINICAL SOCIAL WORK PLACEMENT  NOTE  Date:  07/05/2018  Patient Details  Name: Diana Hartman MRN: [id number] Date of Birth: 04-Dec-1941  Clinical Social Work is seeking post-discharge placement for this patient at the Skilled  Nursing Facility level of care (*CSW will initial, date and re-position this form in  chart as items are completed):  Yes   Patient/family provided with [facility] Clinical Social Work Department's list of facilities offering this level of care within the geographic area requested by the patient (or if unable, by the patient's family).  Yes   Patient/family informed of their freedom to choose among providers that offer the needed level of care, that participate in Medicare, Medicaid or managed care program needed by the patient, have an available bed and are willing to accept the patient.  Yes   Patient/family informed of [place]'s ownership interest in [facility] and [facility], as well as of the fact that they are under no obligation to receive care at these facilities.  PASRR submitted to EDS on 07/01/18     PASRR number received on 07/04/18     Existing PASRR number confirmed on       FL2 transmitted to all facilities in geographic area requested by pt/family on 07/01/18     FL2 transmitted to all facilities within larger geographic area on       Patient informed that his/her managed care company has contracts with or will negotiate with certain facilities, including the following:        Yes   Patient/family informed of bed offers received.  Patient [facility] bed [facility]     Physician recommends and patient chooses bed at      Patient to be [facility] to [facility] on 07/05/18.  Patient to be transferred to facility by [facility]     Patient family notified on 07/05/18 of transfer.  Name of family member notified:  Debra     PHYSICIAN        Additional Comment:    _______________________________________________ Baldemar LenisElizabeth M Ramona Slinger, LCSW 07/05/2018, 12:52 PM

## 2018-07-07 DIAGNOSIS — E119 Type 2 diabetes mellitus without complications: Secondary | ICD-10-CM | POA: Diagnosis not present

## 2018-07-07 DIAGNOSIS — D696 Thrombocytopenia, unspecified: Secondary | ICD-10-CM | POA: Diagnosis not present

## 2018-07-07 DIAGNOSIS — R001 Bradycardia, unspecified: Secondary | ICD-10-CM | POA: Diagnosis not present

## 2018-07-07 DIAGNOSIS — I6320 Cerebral infarction due to unspecified occlusion or stenosis of unspecified precerebral arteries: Secondary | ICD-10-CM | POA: Diagnosis not present

## 2018-07-14 DIAGNOSIS — I6320 Cerebral infarction due to unspecified occlusion or stenosis of unspecified precerebral arteries: Secondary | ICD-10-CM | POA: Diagnosis not present

## 2018-07-14 DIAGNOSIS — I10 Essential (primary) hypertension: Secondary | ICD-10-CM | POA: Diagnosis not present

## 2018-07-14 DIAGNOSIS — R001 Bradycardia, unspecified: Secondary | ICD-10-CM | POA: Diagnosis not present

## 2018-07-14 DIAGNOSIS — E119 Type 2 diabetes mellitus without complications: Secondary | ICD-10-CM | POA: Diagnosis not present

## 2018-07-26 ENCOUNTER — Other Ambulatory Visit: Payer: Medicare Other | Admitting: Internal Medicine

## 2018-07-26 DIAGNOSIS — Z515 Encounter for palliative care: Secondary | ICD-10-CM

## 2018-07-27 DIAGNOSIS — E119 Type 2 diabetes mellitus without complications: Secondary | ICD-10-CM | POA: Diagnosis not present

## 2018-07-27 DIAGNOSIS — I10 Essential (primary) hypertension: Secondary | ICD-10-CM | POA: Diagnosis not present

## 2018-07-27 DIAGNOSIS — R001 Bradycardia, unspecified: Secondary | ICD-10-CM | POA: Diagnosis not present

## 2018-07-27 NOTE — Progress Notes (Signed)
PALLIATIVE CARE CONSULT VISIT   PATIENT NAME: Diana Hartman DOB: 1941-07-14 MRN: [id number]   NOTE:     Arrived at patient's home for routine followup palliative visit.  Pt is not at home due to current residence at [facility] post a CVA.  Conversation with son who states that the long-term plan is for patient to return home however, he does not know the timeframe for this occurrence.  Encouraged him to contact [facility] palliative care department when patient returns home.  Contact information provided.   Margaretha Sheffield, NP

## 2018-08-03 DIAGNOSIS — I6320 Cerebral infarction due to unspecified occlusion or stenosis of unspecified precerebral arteries: Secondary | ICD-10-CM | POA: Diagnosis not present

## 2018-08-03 DIAGNOSIS — R001 Bradycardia, unspecified: Secondary | ICD-10-CM | POA: Diagnosis not present

## 2018-08-03 DIAGNOSIS — I10 Essential (primary) hypertension: Secondary | ICD-10-CM | POA: Diagnosis not present

## 2018-08-03 NOTE — Progress Notes (Signed)
[facility] [address] [place]. [place] [address] [phone]       OFFICE FOLLOW UP NOTE  [name] Date of Birth:  06/23/1941 Medical Record Number:  [id number]   Reason for Referral:  hospital stroke follow up  CHIEF COMPLAINT:  Chief Complaint  Patient presents with  . Follow-up    Stroke hospital follow pt seen by Dr. Pearlean Brownie room 9 pt with Lav[place]  CNA transporter at  [facility]  no family present    HPI: Diana Hartman is being seen today for initial visit in the office for left internal capsule infarct secondary to small vessel disease on 06/28/2018. History obtained from patient and chart review. Reviewed all radiology images and labs personally.  [name] is a 77 y.o. female with history of advanced dementia requiring 24-hour care at home, diabetes, hypertension and temporal arteritis presented with with right-sided weakness and decreased speech output.  CT had reviewed and showed left frontal lobe infarct, age-indeterminate posterior left cerebellar infarct, atrophy and small vessel disease.  MRI head reviewed and showed acute left internal capsule infarct, subacute to old left frontal/MCA territory infarct, old right frontal/MCA territory infarct and severe temporal lobe atrophy.  Carotid Doppler showed bilateral ICA stenosis of 1 to 39% with VA antegrade.  2D echo showed EF of 60 to 65% without cardiac source of embolus noted.  A1c 86 and his patient was on statin PTA recommended Lipitor 80 mg daily.  A1c 6.3.  HTN stable during admission recommended long-term BP goal normotensive range.  Patient was not on antithrombotic PTA and recommended DAPT with aspirin 81 mg and Plavix x3 weeks then aspirin alone.  It was felt as though her stroke was secondary to small vessel disease.  Patient was discharged to SNF for continued therapies.  Patient is being seen today for hospital follow-up and is accompanied by CNA from [facility]  rehabilitation where she is currently residing.  She continues to have global aphasia with right hemiparesis and spasticity.  CNA does not believe that she is having any therapies due to baseline of advanced dementia with global aphasia and being unable to fully participate with therapy meds.  She is currently sitting in wheelchair as she is unable to ambulate.  She has been continuing on aspirin 81 mg without evidence of bruising.  Continues Lipitor 80 mg and unable to assess possible myalgias.  Blood pressure today 159/74.  CNA is unsure if patient will be returning home with her family for 24-hour care which was in place prior to admission or if she will end up at SNF due to total care needs.   ROS:   14 system review of systems performed and negative with exception of unable to assess  PMH:  Past Medical History:  Diagnosis Date  . Dementia   . Diabetes mellitus   . Hypertension   . Stroke (HCC)   . Temporal arteritis (HCC)     PSH:  Past Surgical History:  Procedure Laterality Date  . NO PAST SURGERIES      Social History:  Social History   Socioeconomic History  . Marital status: Married    Spouse name: Not on file  . Number of children: Not on file  . Years of education: Not on file  . Highest education level: Not on file  Occupational History  . Not on file  Social Needs  . Financial resource strain: Not on file  . Food insecurity:  Worry: Not on file    Inability: Not on file  . Transportation needs:    Medical: Not on file    Non-medical: Not on file  Tobacco Use  . Smoking status: Never Smoker  . Smokeless tobacco: Never Used  Substance and Sexual Activity  . Alcohol use: No  . Drug use: No  . Sexual activity: Not on file  Lifestyle  . Physical activity:    Days per week: Not on file    Minutes per session: Not on file  . Stress: Not on file  Relationships  . Social connections:    Talks on phone: Not on file    Gets together: Not on file     Attends religious service: Not on file    Active member of club or organization: Not on file    Attends meetings of clubs or organizations: Not on file    Relationship status: Not on file  . Intimate partner violence:    Fear of current or ex partner: Not on file    Emotionally abused: Not on file    Physically abused: Not on file    Forced sexual activity: Not on file  Other Topics Concern  . Not on file  Social History Narrative  . Not on file    Family History:  Family History  Problem Relation Age of Onset  . Diabetes type II Other   . Hypertension Other     Medications:   Current Outpatient Medications on File Prior to Visit  Medication Sig Dispense Refill  . acetaminophen (TYLENOL) 160 MG/5ML elixir Take 480 mg/kg by mouth every 4 (four) hours as needed for fever.    Marland Kitchen. acetaminophen (TYLENOL) 325 MG tablet Take 2 tablets (650 mg total) by mouth every 6 (six) hours as needed for mild pain (or Fever >/= 101). 30 tablet 0  . aspirin EC 81 MG EC tablet Take 1 tablet (81 mg total) by mouth daily.    Marland Kitchen. atorvastatin (LIPITOR) 80 MG tablet Take 1 tablet (80 mg total) by mouth daily at 6 PM.    . Calcium Carbonate-Vitamin D (CALCIUM 600+D) 600-200 MG-UNIT TABS Take 1 tablet by mouth daily.    . clopidogrel (PLAVIX) 75 MG tablet Take 1 tablet (75 mg total) by mouth daily.    Marland Kitchen. diltiazem (CARDIZEM) 120 MG tablet Take 120 mg by mouth 2 (two) times daily.     Marland Kitchen. donepezil (ARICEPT) 10 MG tablet Take 10 mg by mouth daily.    Marland Kitchen. glipiZIDE (GLUCOTROL) 10 MG tablet Take 10 mg by mouth daily.  1  . GLUCERNA (GLUCERNA) LIQD Take 237 mLs by mouth 2 (two) times daily between meals. (Patient taking differently: Take 237 mLs by mouth as needed. ) 30 Can 0  . insulin detemir (LEVEMIR) 100 UNIT/ML injection Inject 0.1 mLs (10 Units total) into the skin 2 (two) times daily. 10 mL 11  . Iron-Vitamins (GERITOL) LIQD Take 5 mLs by mouth every other day.     . lisinopril (PRINIVIL,ZESTRIL) 40 MG tablet  Take 40 mg by mouth daily.      . Multiple Vitamin (MULTIVITAMIN) tablet Take 1 tablet by mouth daily.    . nebivolol (BYSTOLIC) 5 MG tablet Take 5 mg by mouth daily.    . sertraline (ZOLOFT) 50 MG tablet Take 50 mg by mouth at bedtime.     . traMADol (ULTRAM) 50 MG tablet Take 1 tablet (50 mg total) by mouth every 6 (six) hours as needed. 10  tablet 0  . zolpidem (AMBIEN) 10 MG tablet Take 10 mg by mouth at bedtime as needed.   5   No current facility-administered medications on file prior to visit.     Allergies:  No Known Allergies   Physical Exam  Vitals:   08/04/18 1459  BP: (!) 159/74  Pulse: (!) 52  Height: 5\' 6"  (1.676 m)   Body mass index is 22.49 kg/m. No exam data present  General: Frail elderly African-American female, seated, in no evident distress Head: head normocephalic and atraumatic.   Neck: supple with no carotid or supraclavicular bruits Cardiovascular: regular rate and rhythm, no murmurs Musculoskeletal: no deformity Skin:  no rash/petichiae Vascular:  Normal pulses all extremities  Neurologic Exam Mental Status: Unable to assess orientation or fund of knowledge due to global aphasia and advanced dementia. Cranial Nerves: Fundoscopic exam unable to be performed. Pupils equal, briskly reactive to light. Extraocular movements full without nystagmus. Visual fields full to confrontation. Face, tongue, palate moves normally and symmetrically.  Motor: Difficult to assess strength due to patient not being able to follow commands but appeared to only have mild right hemiparesis with right upper extremity spasticity Sensory.:  Responds to painful stimuli Coordination: Rapid alternating movements unable to assess.  Finger-to-nose and heel-to-shin unable to assess. Gait and Station: Patient is currently sitting in wheelchair as she is unable to ambulate therefore gait not assessed. Reflexes: Brisk in all 4 extremities. Toes downgoing.    NIHSS  9 Modified Rankin   5    Diagnostic Data (Labs, Imaging, Testing)  CT HEAD WO CONTRAST 06/28/2018 IMPRESSION: 1. Age indeterminate left frontal lobe infarct may be acute. This is new since the prior exam and hyperdense to CSF, suggesting acute/subacute time frame rather than chronic. This could correlate with the patient's acute symptoms. 2. Right frontal lobe infarct in a similar location is smaller than on the left. This is also new since the prior study. 3. Age indeterminate posterior left cerebellar infarct measures up to 1.6 cm. 4. Otherwise stable atrophy and white matter disease likely reflecting the sequela of chronic microvascular ischemia.  MR BRAIN WO CONTRAST 06/28/2018 IMPRESSION: 1. Moderately motion degraded examination. Acute 5 x 16 mm LEFT internal capsule nonhemorrhagic infarct. 2. Subacute to old LEFT frontal/MCA territory infarct. Old RIGHT frontal/MCA territory infarct. 3. Severe temporal lobe atrophy seen with neurodegenerative syndromes.  VAS US carotid duplex bilateral 06/28/2018 Final Interpretation: Right Carotid: The extracranial vessels were near-normal with only minimal wall        thickening or plaque. Noticeable vessels tortuosity. Left Carotid: The extracranial vessels were near-normal with only minimal wall       thickening or plaque. Noticeable vessels tortuosity. Vertebrals: Bilateral vertebral arteries demonstrate antegrade flow.  ECHOCARDIOGRAM 06/29/2018 Study Conclusions - Left ventricle: The cavity size was normal. Systolic function was   normal. The estimated ejection fraction was in the range of 60%   to 65%. Wall motion was normal; there were no regional wall   motion abnormalities. Doppler parameters are consistent with   abnormal left ventricular relaxation (grade 1 diastolic   dysfunction). - Atrial septum: No defect or patent foramen ovale was identified.    ASSESSMENT: Diana Hartman is a 77 y.o. year old female here with  left internal capsule infarct on 06/28/2018 secondary to small vessel disease. Vascular risk factors include advanced dementia, HTN, HLD and DM.  Patient is being seen today for hospital follow-up and is accompanied by CNA.  Continued global aphasia with  mild left hemiparesis.    PLAN: -Continue aspirin 81 mg daily  and Lipitor for secondary stroke prevention -F/u with PCP regarding your HLD, HTN and DM management -Recommended PROM to RUE and possibly consider baclofen if needed for right spasticity -continue to monitor BP at home -Maintain strict control of hypertension with blood pressure goal below 130/90, diabetes with hemoglobin A1c goal below 6.5% and cholesterol with LDL cholesterol (bad cholesterol) goal below 70 mg/dL. I also advised the patient to eat a healthy diet with plenty of whole grains, cereals, fruits and vegetables, exercise regularly and maintain ideal body weight.  Follow up in 3 months or call earlier if needed   Greater than 50% of time during this 25 minute visit was spent on counseling,explanation of diagnosis of left internal capsule infarct, reviewing risk factor management of HLD, HTN and DM, planning of further management, discussion with patient and family and coordination of care    George Hugh, AGNP-BC  [facility] [address] [place], [place] [address]  Phone [phone] Fax [phone] Note: This document was prepared with digital dictation and possible smart phrase technology. Any transcriptional errors that result from this process are unintentional.

## 2018-08-04 ENCOUNTER — Ambulatory Visit: Payer: Medicare Other | Admitting: Adult Health

## 2018-08-04 ENCOUNTER — Encounter: Payer: Self-pay | Admitting: Adult Health

## 2018-08-04 VITALS — BP 159/74 | HR 52 | Ht 66.0 in

## 2018-08-04 DIAGNOSIS — I63512 Cerebral infarction due to unspecified occlusion or stenosis of left middle cerebral artery: Secondary | ICD-10-CM | POA: Diagnosis not present

## 2018-08-04 DIAGNOSIS — E785 Hyperlipidemia, unspecified: Secondary | ICD-10-CM

## 2018-08-04 DIAGNOSIS — Z794 Long term (current) use of insulin: Secondary | ICD-10-CM | POA: Diagnosis not present

## 2018-08-04 DIAGNOSIS — I10 Essential (primary) hypertension: Secondary | ICD-10-CM | POA: Diagnosis not present

## 2018-08-04 DIAGNOSIS — E119 Type 2 diabetes mellitus without complications: Secondary | ICD-10-CM | POA: Diagnosis not present

## 2018-08-04 NOTE — Patient Instructions (Signed)
Continue aspirin 81 mg daily  and lipitor  for secondary stroke prevention  Continue to follow up with PCP regarding cholesterol, diabetes and blood pressure management   Recommend passive range of motion to right upper extremity   Continue to monitor blood pressure at home  Maintain strict control of hypertension with blood pressure goal below 130/90, diabetes with hemoglobin A1c goal below 6.5% and cholesterol with LDL cholesterol (bad cholesterol) goal below 70 mg/dL. I also advised the patient to eat a healthy diet with plenty of whole grains, cereals, fruits and vegetables, exercise regularly and maintain ideal body weight.  Followup in the future with me in 3 months or call earlier if needed       Thank you for coming to see us at [facility]. I hope we have been able to provide you high quality care today.  You may receive a patient satisfaction survey over the next few weeks. We would appreciate your feedback and comments so that we may continue to improve ourselves and the health of our patients.

## 2018-08-05 NOTE — Progress Notes (Signed)
I agree with the above plan 

## 2018-08-11 DIAGNOSIS — E119 Type 2 diabetes mellitus without complications: Secondary | ICD-10-CM | POA: Diagnosis not present

## 2018-08-11 DIAGNOSIS — R001 Bradycardia, unspecified: Secondary | ICD-10-CM | POA: Diagnosis not present

## 2018-08-11 DIAGNOSIS — I10 Essential (primary) hypertension: Secondary | ICD-10-CM | POA: Diagnosis not present

## 2018-08-12 DIAGNOSIS — E119 Type 2 diabetes mellitus without complications: Secondary | ICD-10-CM | POA: Diagnosis not present

## 2018-08-12 DIAGNOSIS — I10 Essential (primary) hypertension: Secondary | ICD-10-CM | POA: Diagnosis not present

## 2018-08-12 DIAGNOSIS — R638 Other symptoms and signs concerning food and fluid intake: Secondary | ICD-10-CM | POA: Diagnosis not present

## 2018-08-17 DIAGNOSIS — I10 Essential (primary) hypertension: Secondary | ICD-10-CM | POA: Diagnosis not present

## 2018-08-17 DIAGNOSIS — R001 Bradycardia, unspecified: Secondary | ICD-10-CM | POA: Diagnosis not present

## 2018-08-17 DIAGNOSIS — E119 Type 2 diabetes mellitus without complications: Secondary | ICD-10-CM | POA: Diagnosis not present

## 2018-08-18 DIAGNOSIS — Z7689 Persons encountering health services in other specified circumstances: Secondary | ICD-10-CM | POA: Diagnosis not present

## 2018-08-24 DIAGNOSIS — E119 Type 2 diabetes mellitus without complications: Secondary | ICD-10-CM | POA: Diagnosis not present

## 2018-08-24 DIAGNOSIS — R001 Bradycardia, unspecified: Secondary | ICD-10-CM | POA: Diagnosis not present

## 2018-08-24 DIAGNOSIS — I10 Essential (primary) hypertension: Secondary | ICD-10-CM | POA: Diagnosis not present

## 2018-08-25 DIAGNOSIS — E119 Type 2 diabetes mellitus without complications: Secondary | ICD-10-CM | POA: Diagnosis not present

## 2018-08-25 DIAGNOSIS — I6320 Cerebral infarction due to unspecified occlusion or stenosis of unspecified precerebral arteries: Secondary | ICD-10-CM | POA: Diagnosis not present

## 2018-08-25 DIAGNOSIS — I10 Essential (primary) hypertension: Secondary | ICD-10-CM | POA: Diagnosis not present

## 2018-08-29 DIAGNOSIS — I639 Cerebral infarction, unspecified: Secondary | ICD-10-CM | POA: Diagnosis not present

## 2018-08-29 DIAGNOSIS — I6340 Cerebral infarction due to embolism of unspecified cerebral artery: Secondary | ICD-10-CM | POA: Diagnosis not present

## 2018-08-29 DIAGNOSIS — R531 Weakness: Secondary | ICD-10-CM | POA: Diagnosis not present

## 2018-08-29 DIAGNOSIS — W19XXXA Unspecified fall, initial encounter: Secondary | ICD-10-CM | POA: Diagnosis not present

## 2018-08-30 DIAGNOSIS — E118 Type 2 diabetes mellitus with unspecified complications: Secondary | ICD-10-CM | POA: Diagnosis not present

## 2018-08-30 DIAGNOSIS — I10 Essential (primary) hypertension: Secondary | ICD-10-CM | POA: Diagnosis not present

## 2018-08-30 DIAGNOSIS — G4700 Insomnia, unspecified: Secondary | ICD-10-CM | POA: Diagnosis not present

## 2018-09-05 ENCOUNTER — Telehealth: Payer: Self-pay | Admitting: Adult Health

## 2018-09-05 NOTE — Telephone Encounter (Signed)
RN call Diana Hartman at [facility] to get orders for PT, Ot, nursing, and aid. Rn stated [name] is the stroke neurologist not patients primary doctor. Rn stated [name] is out of the office today, and will be in tomorrow. RN stated a message will be sent to [facility] NP when she returns to the office tomorrow. Diana Hartman verbalized understanding. Pt is out of the nursing home now.

## 2018-09-05 NOTE — Telephone Encounter (Signed)
Diana Hartman with [facility] calling to get orders for PT, OT , nursing and aid. When patient was discharged from the hospital she went to [facility] and these orders were given by the doctor at [facility] [facility]. The patient has already left [facility] just received orders..She called [facility] and they cannot give orders since patient has been discharged from there. Can orders be placed by our office. Please call and advise.

## 2018-09-06 NOTE — Telephone Encounter (Signed)
Rn spoke with [place] NP to get verbal orders for PT, OT, nursing care, and Aide. [name] gave verbal orders to RN for [facility]. Rn will call [facility].

## 2018-09-06 NOTE — Telephone Encounter (Signed)
Noted! Thank you

## 2018-09-06 NOTE — Telephone Encounter (Signed)
RN call Olegario Messier at [facility] to give orders. Rn stated per [name] verbal orders can be approve for PT, OT, and nursing, and aide. Olegario Messier verbalized understanding. Rn ask Olegario Messier if pt has seen PCP, or has a upcoming appt. RN stated when pts are discharge from the nursing home, they are only given 30 day of refills on all meds., RN recommend pt seek PCP within 30 days to get refills on all meds. Rn stated to Olegario Messier pt is oral ,and injection insulin, and blood pressure meds that have to be manage by her PCP. Olegario Messier will notified the other medical team of this. Olegario Messier verbalized understanding.

## 2018-09-08 DIAGNOSIS — E119 Type 2 diabetes mellitus without complications: Secondary | ICD-10-CM | POA: Diagnosis not present

## 2018-09-08 DIAGNOSIS — Z7982 Long term (current) use of aspirin: Secondary | ICD-10-CM | POA: Diagnosis not present

## 2018-09-08 DIAGNOSIS — Z87440 Personal history of urinary (tract) infections: Secondary | ICD-10-CM | POA: Diagnosis not present

## 2018-09-08 DIAGNOSIS — G309 Alzheimer's disease, unspecified: Secondary | ICD-10-CM | POA: Diagnosis not present

## 2018-09-08 DIAGNOSIS — Z9181 History of falling: Secondary | ICD-10-CM | POA: Diagnosis not present

## 2018-09-08 DIAGNOSIS — I69320 Aphasia following cerebral infarction: Secondary | ICD-10-CM | POA: Diagnosis not present

## 2018-09-08 DIAGNOSIS — I639 Cerebral infarction, unspecified: Secondary | ICD-10-CM | POA: Diagnosis not present

## 2018-09-08 DIAGNOSIS — I69351 Hemiplegia and hemiparesis following cerebral infarction affecting right dominant side: Secondary | ICD-10-CM | POA: Diagnosis not present

## 2018-09-08 DIAGNOSIS — Z794 Long term (current) use of insulin: Secondary | ICD-10-CM | POA: Diagnosis not present

## 2018-09-08 DIAGNOSIS — I10 Essential (primary) hypertension: Secondary | ICD-10-CM | POA: Diagnosis not present

## 2018-09-13 DIAGNOSIS — Z87440 Personal history of urinary (tract) infections: Secondary | ICD-10-CM | POA: Diagnosis not present

## 2018-09-13 DIAGNOSIS — I10 Essential (primary) hypertension: Secondary | ICD-10-CM | POA: Diagnosis not present

## 2018-09-13 DIAGNOSIS — G309 Alzheimer's disease, unspecified: Secondary | ICD-10-CM | POA: Diagnosis not present

## 2018-09-13 DIAGNOSIS — E119 Type 2 diabetes mellitus without complications: Secondary | ICD-10-CM | POA: Diagnosis not present

## 2018-09-13 DIAGNOSIS — I69320 Aphasia following cerebral infarction: Secondary | ICD-10-CM | POA: Diagnosis not present

## 2018-09-13 DIAGNOSIS — Z7982 Long term (current) use of aspirin: Secondary | ICD-10-CM | POA: Diagnosis not present

## 2018-09-13 DIAGNOSIS — Z794 Long term (current) use of insulin: Secondary | ICD-10-CM | POA: Diagnosis not present

## 2018-09-13 DIAGNOSIS — I639 Cerebral infarction, unspecified: Secondary | ICD-10-CM | POA: Diagnosis not present

## 2018-09-13 DIAGNOSIS — Z9181 History of falling: Secondary | ICD-10-CM | POA: Diagnosis not present

## 2018-09-13 DIAGNOSIS — I69351 Hemiplegia and hemiparesis following cerebral infarction affecting right dominant side: Secondary | ICD-10-CM | POA: Diagnosis not present

## 2018-09-14 DIAGNOSIS — I69320 Aphasia following cerebral infarction: Secondary | ICD-10-CM | POA: Diagnosis not present

## 2018-09-14 DIAGNOSIS — Z7982 Long term (current) use of aspirin: Secondary | ICD-10-CM | POA: Diagnosis not present

## 2018-09-14 DIAGNOSIS — I10 Essential (primary) hypertension: Secondary | ICD-10-CM | POA: Diagnosis not present

## 2018-09-14 DIAGNOSIS — Z9181 History of falling: Secondary | ICD-10-CM | POA: Diagnosis not present

## 2018-09-14 DIAGNOSIS — I639 Cerebral infarction, unspecified: Secondary | ICD-10-CM | POA: Diagnosis not present

## 2018-09-14 DIAGNOSIS — Z87440 Personal history of urinary (tract) infections: Secondary | ICD-10-CM | POA: Diagnosis not present

## 2018-09-14 DIAGNOSIS — Z794 Long term (current) use of insulin: Secondary | ICD-10-CM | POA: Diagnosis not present

## 2018-09-14 DIAGNOSIS — G309 Alzheimer's disease, unspecified: Secondary | ICD-10-CM | POA: Diagnosis not present

## 2018-09-14 DIAGNOSIS — I69351 Hemiplegia and hemiparesis following cerebral infarction affecting right dominant side: Secondary | ICD-10-CM | POA: Diagnosis not present

## 2018-09-14 DIAGNOSIS — E119 Type 2 diabetes mellitus without complications: Secondary | ICD-10-CM | POA: Diagnosis not present

## 2018-09-15 DIAGNOSIS — E119 Type 2 diabetes mellitus without complications: Secondary | ICD-10-CM | POA: Diagnosis not present

## 2018-09-15 DIAGNOSIS — I10 Essential (primary) hypertension: Secondary | ICD-10-CM | POA: Diagnosis not present

## 2018-09-15 DIAGNOSIS — Z9181 History of falling: Secondary | ICD-10-CM | POA: Diagnosis not present

## 2018-09-15 DIAGNOSIS — I69351 Hemiplegia and hemiparesis following cerebral infarction affecting right dominant side: Secondary | ICD-10-CM | POA: Diagnosis not present

## 2018-09-15 DIAGNOSIS — I639 Cerebral infarction, unspecified: Secondary | ICD-10-CM | POA: Diagnosis not present

## 2018-09-15 DIAGNOSIS — Z7982 Long term (current) use of aspirin: Secondary | ICD-10-CM | POA: Diagnosis not present

## 2018-09-15 DIAGNOSIS — Z794 Long term (current) use of insulin: Secondary | ICD-10-CM | POA: Diagnosis not present

## 2018-09-15 DIAGNOSIS — I69320 Aphasia following cerebral infarction: Secondary | ICD-10-CM | POA: Diagnosis not present

## 2018-09-15 DIAGNOSIS — Z87440 Personal history of urinary (tract) infections: Secondary | ICD-10-CM | POA: Diagnosis not present

## 2018-09-15 DIAGNOSIS — G309 Alzheimer's disease, unspecified: Secondary | ICD-10-CM | POA: Diagnosis not present

## 2018-09-16 DIAGNOSIS — I10 Essential (primary) hypertension: Secondary | ICD-10-CM | POA: Diagnosis not present

## 2018-09-16 DIAGNOSIS — Z9181 History of falling: Secondary | ICD-10-CM | POA: Diagnosis not present

## 2018-09-16 DIAGNOSIS — I69320 Aphasia following cerebral infarction: Secondary | ICD-10-CM | POA: Diagnosis not present

## 2018-09-16 DIAGNOSIS — G309 Alzheimer's disease, unspecified: Secondary | ICD-10-CM | POA: Diagnosis not present

## 2018-09-16 DIAGNOSIS — Z794 Long term (current) use of insulin: Secondary | ICD-10-CM | POA: Diagnosis not present

## 2018-09-16 DIAGNOSIS — I69351 Hemiplegia and hemiparesis following cerebral infarction affecting right dominant side: Secondary | ICD-10-CM | POA: Diagnosis not present

## 2018-09-16 DIAGNOSIS — I639 Cerebral infarction, unspecified: Secondary | ICD-10-CM | POA: Diagnosis not present

## 2018-09-16 DIAGNOSIS — Z87440 Personal history of urinary (tract) infections: Secondary | ICD-10-CM | POA: Diagnosis not present

## 2018-09-16 DIAGNOSIS — Z7982 Long term (current) use of aspirin: Secondary | ICD-10-CM | POA: Diagnosis not present

## 2018-09-16 DIAGNOSIS — E119 Type 2 diabetes mellitus without complications: Secondary | ICD-10-CM | POA: Diagnosis not present

## 2018-09-19 DIAGNOSIS — Z7982 Long term (current) use of aspirin: Secondary | ICD-10-CM | POA: Diagnosis not present

## 2018-09-19 DIAGNOSIS — I69351 Hemiplegia and hemiparesis following cerebral infarction affecting right dominant side: Secondary | ICD-10-CM | POA: Diagnosis not present

## 2018-09-19 DIAGNOSIS — Z794 Long term (current) use of insulin: Secondary | ICD-10-CM | POA: Diagnosis not present

## 2018-09-19 DIAGNOSIS — G309 Alzheimer's disease, unspecified: Secondary | ICD-10-CM | POA: Diagnosis not present

## 2018-09-19 DIAGNOSIS — I69320 Aphasia following cerebral infarction: Secondary | ICD-10-CM | POA: Diagnosis not present

## 2018-09-19 DIAGNOSIS — E119 Type 2 diabetes mellitus without complications: Secondary | ICD-10-CM | POA: Diagnosis not present

## 2018-09-19 DIAGNOSIS — I10 Essential (primary) hypertension: Secondary | ICD-10-CM | POA: Diagnosis not present

## 2018-09-19 DIAGNOSIS — I639 Cerebral infarction, unspecified: Secondary | ICD-10-CM | POA: Diagnosis not present

## 2018-09-19 DIAGNOSIS — Z9181 History of falling: Secondary | ICD-10-CM | POA: Diagnosis not present

## 2018-09-19 DIAGNOSIS — Z87440 Personal history of urinary (tract) infections: Secondary | ICD-10-CM | POA: Diagnosis not present

## 2018-09-20 DIAGNOSIS — I69351 Hemiplegia and hemiparesis following cerebral infarction affecting right dominant side: Secondary | ICD-10-CM | POA: Diagnosis not present

## 2018-09-20 DIAGNOSIS — Z7982 Long term (current) use of aspirin: Secondary | ICD-10-CM | POA: Diagnosis not present

## 2018-09-20 DIAGNOSIS — Z794 Long term (current) use of insulin: Secondary | ICD-10-CM | POA: Diagnosis not present

## 2018-09-20 DIAGNOSIS — G309 Alzheimer's disease, unspecified: Secondary | ICD-10-CM | POA: Diagnosis not present

## 2018-09-20 DIAGNOSIS — I639 Cerebral infarction, unspecified: Secondary | ICD-10-CM | POA: Diagnosis not present

## 2018-09-20 DIAGNOSIS — E119 Type 2 diabetes mellitus without complications: Secondary | ICD-10-CM | POA: Diagnosis not present

## 2018-09-20 DIAGNOSIS — Z87440 Personal history of urinary (tract) infections: Secondary | ICD-10-CM | POA: Diagnosis not present

## 2018-09-20 DIAGNOSIS — I10 Essential (primary) hypertension: Secondary | ICD-10-CM | POA: Diagnosis not present

## 2018-09-20 DIAGNOSIS — I69320 Aphasia following cerebral infarction: Secondary | ICD-10-CM | POA: Diagnosis not present

## 2018-09-20 DIAGNOSIS — Z9181 History of falling: Secondary | ICD-10-CM | POA: Diagnosis not present

## 2018-09-21 DIAGNOSIS — I69320 Aphasia following cerebral infarction: Secondary | ICD-10-CM | POA: Diagnosis not present

## 2018-09-21 DIAGNOSIS — G309 Alzheimer's disease, unspecified: Secondary | ICD-10-CM | POA: Diagnosis not present

## 2018-09-21 DIAGNOSIS — I10 Essential (primary) hypertension: Secondary | ICD-10-CM | POA: Diagnosis not present

## 2018-09-21 DIAGNOSIS — E119 Type 2 diabetes mellitus without complications: Secondary | ICD-10-CM | POA: Diagnosis not present

## 2018-09-21 DIAGNOSIS — I639 Cerebral infarction, unspecified: Secondary | ICD-10-CM | POA: Diagnosis not present

## 2018-09-21 DIAGNOSIS — Z7982 Long term (current) use of aspirin: Secondary | ICD-10-CM | POA: Diagnosis not present

## 2018-09-21 DIAGNOSIS — I69351 Hemiplegia and hemiparesis following cerebral infarction affecting right dominant side: Secondary | ICD-10-CM | POA: Diagnosis not present

## 2018-09-21 DIAGNOSIS — Z9181 History of falling: Secondary | ICD-10-CM | POA: Diagnosis not present

## 2018-09-21 DIAGNOSIS — Z87440 Personal history of urinary (tract) infections: Secondary | ICD-10-CM | POA: Diagnosis not present

## 2018-09-21 DIAGNOSIS — Z794 Long term (current) use of insulin: Secondary | ICD-10-CM | POA: Diagnosis not present

## 2018-09-22 DIAGNOSIS — I10 Essential (primary) hypertension: Secondary | ICD-10-CM | POA: Diagnosis not present

## 2018-09-22 DIAGNOSIS — G309 Alzheimer's disease, unspecified: Secondary | ICD-10-CM | POA: Diagnosis not present

## 2018-09-22 DIAGNOSIS — I69320 Aphasia following cerebral infarction: Secondary | ICD-10-CM | POA: Diagnosis not present

## 2018-09-22 DIAGNOSIS — Z9181 History of falling: Secondary | ICD-10-CM | POA: Diagnosis not present

## 2018-09-22 DIAGNOSIS — E119 Type 2 diabetes mellitus without complications: Secondary | ICD-10-CM | POA: Diagnosis not present

## 2018-09-22 DIAGNOSIS — Z794 Long term (current) use of insulin: Secondary | ICD-10-CM | POA: Diagnosis not present

## 2018-09-22 DIAGNOSIS — Z87440 Personal history of urinary (tract) infections: Secondary | ICD-10-CM | POA: Diagnosis not present

## 2018-09-22 DIAGNOSIS — Z7982 Long term (current) use of aspirin: Secondary | ICD-10-CM | POA: Diagnosis not present

## 2018-09-22 DIAGNOSIS — I639 Cerebral infarction, unspecified: Secondary | ICD-10-CM | POA: Diagnosis not present

## 2018-09-22 DIAGNOSIS — I69351 Hemiplegia and hemiparesis following cerebral infarction affecting right dominant side: Secondary | ICD-10-CM | POA: Diagnosis not present

## 2018-09-26 DIAGNOSIS — Z87440 Personal history of urinary (tract) infections: Secondary | ICD-10-CM | POA: Diagnosis not present

## 2018-09-26 DIAGNOSIS — G309 Alzheimer's disease, unspecified: Secondary | ICD-10-CM | POA: Diagnosis not present

## 2018-09-26 DIAGNOSIS — I69320 Aphasia following cerebral infarction: Secondary | ICD-10-CM | POA: Diagnosis not present

## 2018-09-26 DIAGNOSIS — I69351 Hemiplegia and hemiparesis following cerebral infarction affecting right dominant side: Secondary | ICD-10-CM | POA: Diagnosis not present

## 2018-09-26 DIAGNOSIS — Z7982 Long term (current) use of aspirin: Secondary | ICD-10-CM | POA: Diagnosis not present

## 2018-09-26 DIAGNOSIS — I639 Cerebral infarction, unspecified: Secondary | ICD-10-CM | POA: Diagnosis not present

## 2018-09-26 DIAGNOSIS — I10 Essential (primary) hypertension: Secondary | ICD-10-CM | POA: Diagnosis not present

## 2018-09-26 DIAGNOSIS — E119 Type 2 diabetes mellitus without complications: Secondary | ICD-10-CM | POA: Diagnosis not present

## 2018-09-26 DIAGNOSIS — Z9181 History of falling: Secondary | ICD-10-CM | POA: Diagnosis not present

## 2018-09-26 DIAGNOSIS — Z794 Long term (current) use of insulin: Secondary | ICD-10-CM | POA: Diagnosis not present

## 2018-09-27 DIAGNOSIS — G309 Alzheimer's disease, unspecified: Secondary | ICD-10-CM | POA: Diagnosis not present

## 2018-09-27 DIAGNOSIS — I69351 Hemiplegia and hemiparesis following cerebral infarction affecting right dominant side: Secondary | ICD-10-CM | POA: Diagnosis not present

## 2018-09-27 DIAGNOSIS — I69320 Aphasia following cerebral infarction: Secondary | ICD-10-CM | POA: Diagnosis not present

## 2018-09-27 DIAGNOSIS — Z87440 Personal history of urinary (tract) infections: Secondary | ICD-10-CM | POA: Diagnosis not present

## 2018-09-27 DIAGNOSIS — I10 Essential (primary) hypertension: Secondary | ICD-10-CM | POA: Diagnosis not present

## 2018-09-27 DIAGNOSIS — Z7982 Long term (current) use of aspirin: Secondary | ICD-10-CM | POA: Diagnosis not present

## 2018-09-27 DIAGNOSIS — E119 Type 2 diabetes mellitus without complications: Secondary | ICD-10-CM | POA: Diagnosis not present

## 2018-09-27 DIAGNOSIS — Z9181 History of falling: Secondary | ICD-10-CM | POA: Diagnosis not present

## 2018-09-27 DIAGNOSIS — I639 Cerebral infarction, unspecified: Secondary | ICD-10-CM | POA: Diagnosis not present

## 2018-09-27 DIAGNOSIS — Z794 Long term (current) use of insulin: Secondary | ICD-10-CM | POA: Diagnosis not present

## 2018-09-28 DIAGNOSIS — I10 Essential (primary) hypertension: Secondary | ICD-10-CM | POA: Diagnosis not present

## 2018-09-28 DIAGNOSIS — I69320 Aphasia following cerebral infarction: Secondary | ICD-10-CM | POA: Diagnosis not present

## 2018-09-28 DIAGNOSIS — E119 Type 2 diabetes mellitus without complications: Secondary | ICD-10-CM | POA: Diagnosis not present

## 2018-09-28 DIAGNOSIS — I6340 Cerebral infarction due to embolism of unspecified cerebral artery: Secondary | ICD-10-CM | POA: Diagnosis not present

## 2018-09-28 DIAGNOSIS — Z87440 Personal history of urinary (tract) infections: Secondary | ICD-10-CM | POA: Diagnosis not present

## 2018-09-28 DIAGNOSIS — W19XXXA Unspecified fall, initial encounter: Secondary | ICD-10-CM | POA: Diagnosis not present

## 2018-09-28 DIAGNOSIS — Z7982 Long term (current) use of aspirin: Secondary | ICD-10-CM | POA: Diagnosis not present

## 2018-09-28 DIAGNOSIS — G309 Alzheimer's disease, unspecified: Secondary | ICD-10-CM | POA: Diagnosis not present

## 2018-09-28 DIAGNOSIS — I69351 Hemiplegia and hemiparesis following cerebral infarction affecting right dominant side: Secondary | ICD-10-CM | POA: Diagnosis not present

## 2018-09-28 DIAGNOSIS — Z9181 History of falling: Secondary | ICD-10-CM | POA: Diagnosis not present

## 2018-09-28 DIAGNOSIS — I639 Cerebral infarction, unspecified: Secondary | ICD-10-CM | POA: Diagnosis not present

## 2018-09-28 DIAGNOSIS — Z794 Long term (current) use of insulin: Secondary | ICD-10-CM | POA: Diagnosis not present

## 2018-09-29 DIAGNOSIS — E119 Type 2 diabetes mellitus without complications: Secondary | ICD-10-CM | POA: Diagnosis not present

## 2018-09-29 DIAGNOSIS — Z794 Long term (current) use of insulin: Secondary | ICD-10-CM | POA: Diagnosis not present

## 2018-09-29 DIAGNOSIS — I69320 Aphasia following cerebral infarction: Secondary | ICD-10-CM | POA: Diagnosis not present

## 2018-09-29 DIAGNOSIS — Z87440 Personal history of urinary (tract) infections: Secondary | ICD-10-CM | POA: Diagnosis not present

## 2018-09-29 DIAGNOSIS — Z9181 History of falling: Secondary | ICD-10-CM | POA: Diagnosis not present

## 2018-09-29 DIAGNOSIS — G309 Alzheimer's disease, unspecified: Secondary | ICD-10-CM | POA: Diagnosis not present

## 2018-09-29 DIAGNOSIS — I69351 Hemiplegia and hemiparesis following cerebral infarction affecting right dominant side: Secondary | ICD-10-CM | POA: Diagnosis not present

## 2018-09-29 DIAGNOSIS — I639 Cerebral infarction, unspecified: Secondary | ICD-10-CM | POA: Diagnosis not present

## 2018-09-29 DIAGNOSIS — Z7982 Long term (current) use of aspirin: Secondary | ICD-10-CM | POA: Diagnosis not present

## 2018-09-29 DIAGNOSIS — I10 Essential (primary) hypertension: Secondary | ICD-10-CM | POA: Diagnosis not present

## 2018-09-30 DIAGNOSIS — Z9181 History of falling: Secondary | ICD-10-CM | POA: Diagnosis not present

## 2018-09-30 DIAGNOSIS — I639 Cerebral infarction, unspecified: Secondary | ICD-10-CM | POA: Diagnosis not present

## 2018-09-30 DIAGNOSIS — I69351 Hemiplegia and hemiparesis following cerebral infarction affecting right dominant side: Secondary | ICD-10-CM | POA: Diagnosis not present

## 2018-09-30 DIAGNOSIS — Z794 Long term (current) use of insulin: Secondary | ICD-10-CM | POA: Diagnosis not present

## 2018-09-30 DIAGNOSIS — I69320 Aphasia following cerebral infarction: Secondary | ICD-10-CM | POA: Diagnosis not present

## 2018-09-30 DIAGNOSIS — G309 Alzheimer's disease, unspecified: Secondary | ICD-10-CM | POA: Diagnosis not present

## 2018-09-30 DIAGNOSIS — I10 Essential (primary) hypertension: Secondary | ICD-10-CM | POA: Diagnosis not present

## 2018-09-30 DIAGNOSIS — E119 Type 2 diabetes mellitus without complications: Secondary | ICD-10-CM | POA: Diagnosis not present

## 2018-09-30 DIAGNOSIS — Z7982 Long term (current) use of aspirin: Secondary | ICD-10-CM | POA: Diagnosis not present

## 2018-09-30 DIAGNOSIS — Z87440 Personal history of urinary (tract) infections: Secondary | ICD-10-CM | POA: Diagnosis not present

## 2018-10-03 DIAGNOSIS — Z7982 Long term (current) use of aspirin: Secondary | ICD-10-CM | POA: Diagnosis not present

## 2018-10-03 DIAGNOSIS — Z9181 History of falling: Secondary | ICD-10-CM | POA: Diagnosis not present

## 2018-10-03 DIAGNOSIS — I69351 Hemiplegia and hemiparesis following cerebral infarction affecting right dominant side: Secondary | ICD-10-CM | POA: Diagnosis not present

## 2018-10-03 DIAGNOSIS — I69320 Aphasia following cerebral infarction: Secondary | ICD-10-CM | POA: Diagnosis not present

## 2018-10-03 DIAGNOSIS — I10 Essential (primary) hypertension: Secondary | ICD-10-CM | POA: Diagnosis not present

## 2018-10-03 DIAGNOSIS — E119 Type 2 diabetes mellitus without complications: Secondary | ICD-10-CM | POA: Diagnosis not present

## 2018-10-03 DIAGNOSIS — I639 Cerebral infarction, unspecified: Secondary | ICD-10-CM | POA: Diagnosis not present

## 2018-10-03 DIAGNOSIS — Z87440 Personal history of urinary (tract) infections: Secondary | ICD-10-CM | POA: Diagnosis not present

## 2018-10-03 DIAGNOSIS — G309 Alzheimer's disease, unspecified: Secondary | ICD-10-CM | POA: Diagnosis not present

## 2018-10-03 DIAGNOSIS — Z794 Long term (current) use of insulin: Secondary | ICD-10-CM | POA: Diagnosis not present

## 2018-10-04 DIAGNOSIS — Z87440 Personal history of urinary (tract) infections: Secondary | ICD-10-CM | POA: Diagnosis not present

## 2018-10-04 DIAGNOSIS — Z794 Long term (current) use of insulin: Secondary | ICD-10-CM | POA: Diagnosis not present

## 2018-10-04 DIAGNOSIS — E119 Type 2 diabetes mellitus without complications: Secondary | ICD-10-CM | POA: Diagnosis not present

## 2018-10-04 DIAGNOSIS — I69320 Aphasia following cerebral infarction: Secondary | ICD-10-CM | POA: Diagnosis not present

## 2018-10-04 DIAGNOSIS — G309 Alzheimer's disease, unspecified: Secondary | ICD-10-CM | POA: Diagnosis not present

## 2018-10-04 DIAGNOSIS — Z9181 History of falling: Secondary | ICD-10-CM | POA: Diagnosis not present

## 2018-10-04 DIAGNOSIS — I639 Cerebral infarction, unspecified: Secondary | ICD-10-CM | POA: Diagnosis not present

## 2018-10-04 DIAGNOSIS — I69351 Hemiplegia and hemiparesis following cerebral infarction affecting right dominant side: Secondary | ICD-10-CM | POA: Diagnosis not present

## 2018-10-04 DIAGNOSIS — I10 Essential (primary) hypertension: Secondary | ICD-10-CM | POA: Diagnosis not present

## 2018-10-04 DIAGNOSIS — Z7982 Long term (current) use of aspirin: Secondary | ICD-10-CM | POA: Diagnosis not present

## 2018-10-05 DIAGNOSIS — E119 Type 2 diabetes mellitus without complications: Secondary | ICD-10-CM | POA: Diagnosis not present

## 2018-10-05 DIAGNOSIS — I69351 Hemiplegia and hemiparesis following cerebral infarction affecting right dominant side: Secondary | ICD-10-CM | POA: Diagnosis not present

## 2018-10-05 DIAGNOSIS — I69320 Aphasia following cerebral infarction: Secondary | ICD-10-CM | POA: Diagnosis not present

## 2018-10-05 DIAGNOSIS — I639 Cerebral infarction, unspecified: Secondary | ICD-10-CM | POA: Diagnosis not present

## 2018-10-05 DIAGNOSIS — I10 Essential (primary) hypertension: Secondary | ICD-10-CM | POA: Diagnosis not present

## 2018-10-05 DIAGNOSIS — G309 Alzheimer's disease, unspecified: Secondary | ICD-10-CM | POA: Diagnosis not present

## 2018-10-05 DIAGNOSIS — Z794 Long term (current) use of insulin: Secondary | ICD-10-CM | POA: Diagnosis not present

## 2018-10-05 DIAGNOSIS — Z87440 Personal history of urinary (tract) infections: Secondary | ICD-10-CM | POA: Diagnosis not present

## 2018-10-05 DIAGNOSIS — Z9181 History of falling: Secondary | ICD-10-CM | POA: Diagnosis not present

## 2018-10-05 DIAGNOSIS — Z7982 Long term (current) use of aspirin: Secondary | ICD-10-CM | POA: Diagnosis not present

## 2018-10-06 DIAGNOSIS — E119 Type 2 diabetes mellitus without complications: Secondary | ICD-10-CM | POA: Diagnosis not present

## 2018-10-06 DIAGNOSIS — Z794 Long term (current) use of insulin: Secondary | ICD-10-CM | POA: Diagnosis not present

## 2018-10-06 DIAGNOSIS — Z7982 Long term (current) use of aspirin: Secondary | ICD-10-CM | POA: Diagnosis not present

## 2018-10-06 DIAGNOSIS — I69351 Hemiplegia and hemiparesis following cerebral infarction affecting right dominant side: Secondary | ICD-10-CM | POA: Diagnosis not present

## 2018-10-06 DIAGNOSIS — G309 Alzheimer's disease, unspecified: Secondary | ICD-10-CM | POA: Diagnosis not present

## 2018-10-06 DIAGNOSIS — I69320 Aphasia following cerebral infarction: Secondary | ICD-10-CM | POA: Diagnosis not present

## 2018-10-06 DIAGNOSIS — Z87440 Personal history of urinary (tract) infections: Secondary | ICD-10-CM | POA: Diagnosis not present

## 2018-10-06 DIAGNOSIS — I10 Essential (primary) hypertension: Secondary | ICD-10-CM | POA: Diagnosis not present

## 2018-10-06 DIAGNOSIS — Z9181 History of falling: Secondary | ICD-10-CM | POA: Diagnosis not present

## 2018-10-06 DIAGNOSIS — I639 Cerebral infarction, unspecified: Secondary | ICD-10-CM | POA: Diagnosis not present

## 2018-10-07 DIAGNOSIS — I69320 Aphasia following cerebral infarction: Secondary | ICD-10-CM | POA: Diagnosis not present

## 2018-10-07 DIAGNOSIS — Z794 Long term (current) use of insulin: Secondary | ICD-10-CM | POA: Diagnosis not present

## 2018-10-07 DIAGNOSIS — Z9181 History of falling: Secondary | ICD-10-CM | POA: Diagnosis not present

## 2018-10-07 DIAGNOSIS — I639 Cerebral infarction, unspecified: Secondary | ICD-10-CM | POA: Diagnosis not present

## 2018-10-07 DIAGNOSIS — E119 Type 2 diabetes mellitus without complications: Secondary | ICD-10-CM | POA: Diagnosis not present

## 2018-10-07 DIAGNOSIS — Z87440 Personal history of urinary (tract) infections: Secondary | ICD-10-CM | POA: Diagnosis not present

## 2018-10-07 DIAGNOSIS — I10 Essential (primary) hypertension: Secondary | ICD-10-CM | POA: Diagnosis not present

## 2018-10-07 DIAGNOSIS — I69351 Hemiplegia and hemiparesis following cerebral infarction affecting right dominant side: Secondary | ICD-10-CM | POA: Diagnosis not present

## 2018-10-07 DIAGNOSIS — Z7982 Long term (current) use of aspirin: Secondary | ICD-10-CM | POA: Diagnosis not present

## 2018-10-07 DIAGNOSIS — G309 Alzheimer's disease, unspecified: Secondary | ICD-10-CM | POA: Diagnosis not present

## 2018-10-11 DIAGNOSIS — I10 Essential (primary) hypertension: Secondary | ICD-10-CM | POA: Diagnosis not present

## 2018-10-11 DIAGNOSIS — Z794 Long term (current) use of insulin: Secondary | ICD-10-CM | POA: Diagnosis not present

## 2018-10-11 DIAGNOSIS — E119 Type 2 diabetes mellitus without complications: Secondary | ICD-10-CM | POA: Diagnosis not present

## 2018-10-11 DIAGNOSIS — I69351 Hemiplegia and hemiparesis following cerebral infarction affecting right dominant side: Secondary | ICD-10-CM | POA: Diagnosis not present

## 2018-10-11 DIAGNOSIS — I639 Cerebral infarction, unspecified: Secondary | ICD-10-CM | POA: Diagnosis not present

## 2018-10-11 DIAGNOSIS — I69320 Aphasia following cerebral infarction: Secondary | ICD-10-CM | POA: Diagnosis not present

## 2018-10-11 DIAGNOSIS — Z87440 Personal history of urinary (tract) infections: Secondary | ICD-10-CM | POA: Diagnosis not present

## 2018-10-11 DIAGNOSIS — Z9181 History of falling: Secondary | ICD-10-CM | POA: Diagnosis not present

## 2018-10-11 DIAGNOSIS — G309 Alzheimer's disease, unspecified: Secondary | ICD-10-CM | POA: Diagnosis not present

## 2018-10-11 DIAGNOSIS — Z7982 Long term (current) use of aspirin: Secondary | ICD-10-CM | POA: Diagnosis not present

## 2018-10-12 DIAGNOSIS — Z9181 History of falling: Secondary | ICD-10-CM | POA: Diagnosis not present

## 2018-10-12 DIAGNOSIS — I69351 Hemiplegia and hemiparesis following cerebral infarction affecting right dominant side: Secondary | ICD-10-CM | POA: Diagnosis not present

## 2018-10-12 DIAGNOSIS — G309 Alzheimer's disease, unspecified: Secondary | ICD-10-CM | POA: Diagnosis not present

## 2018-10-12 DIAGNOSIS — E119 Type 2 diabetes mellitus without complications: Secondary | ICD-10-CM | POA: Diagnosis not present

## 2018-10-12 DIAGNOSIS — Z794 Long term (current) use of insulin: Secondary | ICD-10-CM | POA: Diagnosis not present

## 2018-10-12 DIAGNOSIS — Z7982 Long term (current) use of aspirin: Secondary | ICD-10-CM | POA: Diagnosis not present

## 2018-10-12 DIAGNOSIS — I639 Cerebral infarction, unspecified: Secondary | ICD-10-CM | POA: Diagnosis not present

## 2018-10-12 DIAGNOSIS — I69320 Aphasia following cerebral infarction: Secondary | ICD-10-CM | POA: Diagnosis not present

## 2018-10-12 DIAGNOSIS — Z87440 Personal history of urinary (tract) infections: Secondary | ICD-10-CM | POA: Diagnosis not present

## 2018-10-12 DIAGNOSIS — I10 Essential (primary) hypertension: Secondary | ICD-10-CM | POA: Diagnosis not present

## 2018-10-13 DIAGNOSIS — Z87440 Personal history of urinary (tract) infections: Secondary | ICD-10-CM | POA: Diagnosis not present

## 2018-10-13 DIAGNOSIS — Z794 Long term (current) use of insulin: Secondary | ICD-10-CM | POA: Diagnosis not present

## 2018-10-13 DIAGNOSIS — E119 Type 2 diabetes mellitus without complications: Secondary | ICD-10-CM | POA: Diagnosis not present

## 2018-10-13 DIAGNOSIS — I69320 Aphasia following cerebral infarction: Secondary | ICD-10-CM | POA: Diagnosis not present

## 2018-10-13 DIAGNOSIS — Z9181 History of falling: Secondary | ICD-10-CM | POA: Diagnosis not present

## 2018-10-13 DIAGNOSIS — I639 Cerebral infarction, unspecified: Secondary | ICD-10-CM | POA: Diagnosis not present

## 2018-10-13 DIAGNOSIS — I10 Essential (primary) hypertension: Secondary | ICD-10-CM | POA: Diagnosis not present

## 2018-10-13 DIAGNOSIS — Z7982 Long term (current) use of aspirin: Secondary | ICD-10-CM | POA: Diagnosis not present

## 2018-10-13 DIAGNOSIS — I69351 Hemiplegia and hemiparesis following cerebral infarction affecting right dominant side: Secondary | ICD-10-CM | POA: Diagnosis not present

## 2018-10-13 DIAGNOSIS — G309 Alzheimer's disease, unspecified: Secondary | ICD-10-CM | POA: Diagnosis not present

## 2018-10-14 DIAGNOSIS — Z794 Long term (current) use of insulin: Secondary | ICD-10-CM | POA: Diagnosis not present

## 2018-10-14 DIAGNOSIS — G309 Alzheimer's disease, unspecified: Secondary | ICD-10-CM | POA: Diagnosis not present

## 2018-10-14 DIAGNOSIS — I69320 Aphasia following cerebral infarction: Secondary | ICD-10-CM | POA: Diagnosis not present

## 2018-10-14 DIAGNOSIS — Z9181 History of falling: Secondary | ICD-10-CM | POA: Diagnosis not present

## 2018-10-14 DIAGNOSIS — I10 Essential (primary) hypertension: Secondary | ICD-10-CM | POA: Diagnosis not present

## 2018-10-14 DIAGNOSIS — I639 Cerebral infarction, unspecified: Secondary | ICD-10-CM | POA: Diagnosis not present

## 2018-10-14 DIAGNOSIS — E119 Type 2 diabetes mellitus without complications: Secondary | ICD-10-CM | POA: Diagnosis not present

## 2018-10-14 DIAGNOSIS — I69351 Hemiplegia and hemiparesis following cerebral infarction affecting right dominant side: Secondary | ICD-10-CM | POA: Diagnosis not present

## 2018-10-14 DIAGNOSIS — Z7982 Long term (current) use of aspirin: Secondary | ICD-10-CM | POA: Diagnosis not present

## 2018-10-14 DIAGNOSIS — Z87440 Personal history of urinary (tract) infections: Secondary | ICD-10-CM | POA: Diagnosis not present

## 2018-10-17 DIAGNOSIS — Z87440 Personal history of urinary (tract) infections: Secondary | ICD-10-CM | POA: Diagnosis not present

## 2018-10-17 DIAGNOSIS — I69351 Hemiplegia and hemiparesis following cerebral infarction affecting right dominant side: Secondary | ICD-10-CM | POA: Diagnosis not present

## 2018-10-17 DIAGNOSIS — Z7982 Long term (current) use of aspirin: Secondary | ICD-10-CM | POA: Diagnosis not present

## 2018-10-17 DIAGNOSIS — I10 Essential (primary) hypertension: Secondary | ICD-10-CM | POA: Diagnosis not present

## 2018-10-17 DIAGNOSIS — I69320 Aphasia following cerebral infarction: Secondary | ICD-10-CM | POA: Diagnosis not present

## 2018-10-17 DIAGNOSIS — Z794 Long term (current) use of insulin: Secondary | ICD-10-CM | POA: Diagnosis not present

## 2018-10-17 DIAGNOSIS — E119 Type 2 diabetes mellitus without complications: Secondary | ICD-10-CM | POA: Diagnosis not present

## 2018-10-17 DIAGNOSIS — Z9181 History of falling: Secondary | ICD-10-CM | POA: Diagnosis not present

## 2018-10-17 DIAGNOSIS — G309 Alzheimer's disease, unspecified: Secondary | ICD-10-CM | POA: Diagnosis not present

## 2018-10-17 DIAGNOSIS — I639 Cerebral infarction, unspecified: Secondary | ICD-10-CM | POA: Diagnosis not present

## 2018-10-18 DIAGNOSIS — G309 Alzheimer's disease, unspecified: Secondary | ICD-10-CM | POA: Diagnosis not present

## 2018-10-18 DIAGNOSIS — I639 Cerebral infarction, unspecified: Secondary | ICD-10-CM | POA: Diagnosis not present

## 2018-10-18 DIAGNOSIS — Z87440 Personal history of urinary (tract) infections: Secondary | ICD-10-CM | POA: Diagnosis not present

## 2018-10-18 DIAGNOSIS — Z794 Long term (current) use of insulin: Secondary | ICD-10-CM | POA: Diagnosis not present

## 2018-10-18 DIAGNOSIS — Z7982 Long term (current) use of aspirin: Secondary | ICD-10-CM | POA: Diagnosis not present

## 2018-10-18 DIAGNOSIS — Z9181 History of falling: Secondary | ICD-10-CM | POA: Diagnosis not present

## 2018-10-18 DIAGNOSIS — I69320 Aphasia following cerebral infarction: Secondary | ICD-10-CM | POA: Diagnosis not present

## 2018-10-18 DIAGNOSIS — I69351 Hemiplegia and hemiparesis following cerebral infarction affecting right dominant side: Secondary | ICD-10-CM | POA: Diagnosis not present

## 2018-10-18 DIAGNOSIS — E119 Type 2 diabetes mellitus without complications: Secondary | ICD-10-CM | POA: Diagnosis not present

## 2018-10-18 DIAGNOSIS — I10 Essential (primary) hypertension: Secondary | ICD-10-CM | POA: Diagnosis not present

## 2018-10-19 DIAGNOSIS — I69320 Aphasia following cerebral infarction: Secondary | ICD-10-CM | POA: Diagnosis not present

## 2018-10-19 DIAGNOSIS — I69351 Hemiplegia and hemiparesis following cerebral infarction affecting right dominant side: Secondary | ICD-10-CM | POA: Diagnosis not present

## 2018-10-19 DIAGNOSIS — I639 Cerebral infarction, unspecified: Secondary | ICD-10-CM | POA: Diagnosis not present

## 2018-10-19 DIAGNOSIS — I10 Essential (primary) hypertension: Secondary | ICD-10-CM | POA: Diagnosis not present

## 2018-10-19 DIAGNOSIS — Z9181 History of falling: Secondary | ICD-10-CM | POA: Diagnosis not present

## 2018-10-19 DIAGNOSIS — Z794 Long term (current) use of insulin: Secondary | ICD-10-CM | POA: Diagnosis not present

## 2018-10-19 DIAGNOSIS — G309 Alzheimer's disease, unspecified: Secondary | ICD-10-CM | POA: Diagnosis not present

## 2018-10-19 DIAGNOSIS — E119 Type 2 diabetes mellitus without complications: Secondary | ICD-10-CM | POA: Diagnosis not present

## 2018-10-19 DIAGNOSIS — Z87440 Personal history of urinary (tract) infections: Secondary | ICD-10-CM | POA: Diagnosis not present

## 2018-10-19 DIAGNOSIS — Z7982 Long term (current) use of aspirin: Secondary | ICD-10-CM | POA: Diagnosis not present

## 2018-10-20 DIAGNOSIS — I639 Cerebral infarction, unspecified: Secondary | ICD-10-CM | POA: Diagnosis not present

## 2018-10-20 DIAGNOSIS — I10 Essential (primary) hypertension: Secondary | ICD-10-CM | POA: Diagnosis not present

## 2018-10-20 DIAGNOSIS — G309 Alzheimer's disease, unspecified: Secondary | ICD-10-CM | POA: Diagnosis not present

## 2018-10-20 DIAGNOSIS — E119 Type 2 diabetes mellitus without complications: Secondary | ICD-10-CM | POA: Diagnosis not present

## 2018-10-20 DIAGNOSIS — Z87440 Personal history of urinary (tract) infections: Secondary | ICD-10-CM | POA: Diagnosis not present

## 2018-10-20 DIAGNOSIS — Z794 Long term (current) use of insulin: Secondary | ICD-10-CM | POA: Diagnosis not present

## 2018-10-20 DIAGNOSIS — Z7982 Long term (current) use of aspirin: Secondary | ICD-10-CM | POA: Diagnosis not present

## 2018-10-20 DIAGNOSIS — Z9181 History of falling: Secondary | ICD-10-CM | POA: Diagnosis not present

## 2018-10-20 DIAGNOSIS — I69320 Aphasia following cerebral infarction: Secondary | ICD-10-CM | POA: Diagnosis not present

## 2018-10-20 DIAGNOSIS — I69351 Hemiplegia and hemiparesis following cerebral infarction affecting right dominant side: Secondary | ICD-10-CM | POA: Diagnosis not present

## 2018-10-24 DIAGNOSIS — I639 Cerebral infarction, unspecified: Secondary | ICD-10-CM | POA: Diagnosis not present

## 2018-10-24 DIAGNOSIS — I69320 Aphasia following cerebral infarction: Secondary | ICD-10-CM | POA: Diagnosis not present

## 2018-10-24 DIAGNOSIS — Z794 Long term (current) use of insulin: Secondary | ICD-10-CM | POA: Diagnosis not present

## 2018-10-24 DIAGNOSIS — Z7982 Long term (current) use of aspirin: Secondary | ICD-10-CM | POA: Diagnosis not present

## 2018-10-24 DIAGNOSIS — E119 Type 2 diabetes mellitus without complications: Secondary | ICD-10-CM | POA: Diagnosis not present

## 2018-10-24 DIAGNOSIS — I10 Essential (primary) hypertension: Secondary | ICD-10-CM | POA: Diagnosis not present

## 2018-10-24 DIAGNOSIS — I69351 Hemiplegia and hemiparesis following cerebral infarction affecting right dominant side: Secondary | ICD-10-CM | POA: Diagnosis not present

## 2018-10-24 DIAGNOSIS — Z9181 History of falling: Secondary | ICD-10-CM | POA: Diagnosis not present

## 2018-10-24 DIAGNOSIS — G309 Alzheimer's disease, unspecified: Secondary | ICD-10-CM | POA: Diagnosis not present

## 2018-10-24 DIAGNOSIS — Z87440 Personal history of urinary (tract) infections: Secondary | ICD-10-CM | POA: Diagnosis not present

## 2018-10-25 DIAGNOSIS — I69320 Aphasia following cerebral infarction: Secondary | ICD-10-CM | POA: Diagnosis not present

## 2018-10-25 DIAGNOSIS — Z9181 History of falling: Secondary | ICD-10-CM | POA: Diagnosis not present

## 2018-10-25 DIAGNOSIS — Z794 Long term (current) use of insulin: Secondary | ICD-10-CM | POA: Diagnosis not present

## 2018-10-25 DIAGNOSIS — Z7982 Long term (current) use of aspirin: Secondary | ICD-10-CM | POA: Diagnosis not present

## 2018-10-25 DIAGNOSIS — Z87440 Personal history of urinary (tract) infections: Secondary | ICD-10-CM | POA: Diagnosis not present

## 2018-10-25 DIAGNOSIS — I69351 Hemiplegia and hemiparesis following cerebral infarction affecting right dominant side: Secondary | ICD-10-CM | POA: Diagnosis not present

## 2018-10-25 DIAGNOSIS — E119 Type 2 diabetes mellitus without complications: Secondary | ICD-10-CM | POA: Diagnosis not present

## 2018-10-25 DIAGNOSIS — I10 Essential (primary) hypertension: Secondary | ICD-10-CM | POA: Diagnosis not present

## 2018-10-25 DIAGNOSIS — I639 Cerebral infarction, unspecified: Secondary | ICD-10-CM | POA: Diagnosis not present

## 2018-10-25 DIAGNOSIS — G309 Alzheimer's disease, unspecified: Secondary | ICD-10-CM | POA: Diagnosis not present

## 2018-10-26 DIAGNOSIS — I69320 Aphasia following cerebral infarction: Secondary | ICD-10-CM | POA: Diagnosis not present

## 2018-10-26 DIAGNOSIS — Z87440 Personal history of urinary (tract) infections: Secondary | ICD-10-CM | POA: Diagnosis not present

## 2018-10-26 DIAGNOSIS — I69351 Hemiplegia and hemiparesis following cerebral infarction affecting right dominant side: Secondary | ICD-10-CM | POA: Diagnosis not present

## 2018-10-26 DIAGNOSIS — Z9181 History of falling: Secondary | ICD-10-CM | POA: Diagnosis not present

## 2018-10-26 DIAGNOSIS — E119 Type 2 diabetes mellitus without complications: Secondary | ICD-10-CM | POA: Diagnosis not present

## 2018-10-26 DIAGNOSIS — Z794 Long term (current) use of insulin: Secondary | ICD-10-CM | POA: Diagnosis not present

## 2018-10-26 DIAGNOSIS — Z7982 Long term (current) use of aspirin: Secondary | ICD-10-CM | POA: Diagnosis not present

## 2018-10-26 DIAGNOSIS — I10 Essential (primary) hypertension: Secondary | ICD-10-CM | POA: Diagnosis not present

## 2018-10-26 DIAGNOSIS — I639 Cerebral infarction, unspecified: Secondary | ICD-10-CM | POA: Diagnosis not present

## 2018-10-26 DIAGNOSIS — G309 Alzheimer's disease, unspecified: Secondary | ICD-10-CM | POA: Diagnosis not present

## 2018-10-28 DIAGNOSIS — E119 Type 2 diabetes mellitus without complications: Secondary | ICD-10-CM | POA: Diagnosis not present

## 2018-10-28 DIAGNOSIS — Z9181 History of falling: Secondary | ICD-10-CM | POA: Diagnosis not present

## 2018-10-28 DIAGNOSIS — I10 Essential (primary) hypertension: Secondary | ICD-10-CM | POA: Diagnosis not present

## 2018-10-28 DIAGNOSIS — I639 Cerebral infarction, unspecified: Secondary | ICD-10-CM | POA: Diagnosis not present

## 2018-10-28 DIAGNOSIS — G309 Alzheimer's disease, unspecified: Secondary | ICD-10-CM | POA: Diagnosis not present

## 2018-10-28 DIAGNOSIS — Z7982 Long term (current) use of aspirin: Secondary | ICD-10-CM | POA: Diagnosis not present

## 2018-10-28 DIAGNOSIS — Z794 Long term (current) use of insulin: Secondary | ICD-10-CM | POA: Diagnosis not present

## 2018-10-28 DIAGNOSIS — I69320 Aphasia following cerebral infarction: Secondary | ICD-10-CM | POA: Diagnosis not present

## 2018-10-28 DIAGNOSIS — Z87440 Personal history of urinary (tract) infections: Secondary | ICD-10-CM | POA: Diagnosis not present

## 2018-10-28 DIAGNOSIS — I69351 Hemiplegia and hemiparesis following cerebral infarction affecting right dominant side: Secondary | ICD-10-CM | POA: Diagnosis not present

## 2018-10-29 DIAGNOSIS — I6340 Cerebral infarction due to embolism of unspecified cerebral artery: Secondary | ICD-10-CM | POA: Diagnosis not present

## 2018-10-29 DIAGNOSIS — W19XXXA Unspecified fall, initial encounter: Secondary | ICD-10-CM | POA: Diagnosis not present

## 2018-10-31 DIAGNOSIS — Z9181 History of falling: Secondary | ICD-10-CM | POA: Diagnosis not present

## 2018-10-31 DIAGNOSIS — Z87440 Personal history of urinary (tract) infections: Secondary | ICD-10-CM | POA: Diagnosis not present

## 2018-10-31 DIAGNOSIS — E119 Type 2 diabetes mellitus without complications: Secondary | ICD-10-CM | POA: Diagnosis not present

## 2018-10-31 DIAGNOSIS — G309 Alzheimer's disease, unspecified: Secondary | ICD-10-CM | POA: Diagnosis not present

## 2018-10-31 DIAGNOSIS — I69351 Hemiplegia and hemiparesis following cerebral infarction affecting right dominant side: Secondary | ICD-10-CM | POA: Diagnosis not present

## 2018-10-31 DIAGNOSIS — I69320 Aphasia following cerebral infarction: Secondary | ICD-10-CM | POA: Diagnosis not present

## 2018-10-31 DIAGNOSIS — Z7982 Long term (current) use of aspirin: Secondary | ICD-10-CM | POA: Diagnosis not present

## 2018-10-31 DIAGNOSIS — I10 Essential (primary) hypertension: Secondary | ICD-10-CM | POA: Diagnosis not present

## 2018-10-31 DIAGNOSIS — I639 Cerebral infarction, unspecified: Secondary | ICD-10-CM | POA: Diagnosis not present

## 2018-10-31 DIAGNOSIS — Z794 Long term (current) use of insulin: Secondary | ICD-10-CM | POA: Diagnosis not present

## 2018-11-01 DIAGNOSIS — I10 Essential (primary) hypertension: Secondary | ICD-10-CM | POA: Diagnosis not present

## 2018-11-01 DIAGNOSIS — I69351 Hemiplegia and hemiparesis following cerebral infarction affecting right dominant side: Secondary | ICD-10-CM | POA: Diagnosis not present

## 2018-11-01 DIAGNOSIS — I639 Cerebral infarction, unspecified: Secondary | ICD-10-CM | POA: Diagnosis not present

## 2018-11-01 DIAGNOSIS — Z87440 Personal history of urinary (tract) infections: Secondary | ICD-10-CM | POA: Diagnosis not present

## 2018-11-01 DIAGNOSIS — E119 Type 2 diabetes mellitus without complications: Secondary | ICD-10-CM | POA: Diagnosis not present

## 2018-11-01 DIAGNOSIS — G309 Alzheimer's disease, unspecified: Secondary | ICD-10-CM | POA: Diagnosis not present

## 2018-11-01 DIAGNOSIS — I69320 Aphasia following cerebral infarction: Secondary | ICD-10-CM | POA: Diagnosis not present

## 2018-11-01 DIAGNOSIS — Z794 Long term (current) use of insulin: Secondary | ICD-10-CM | POA: Diagnosis not present

## 2018-11-01 DIAGNOSIS — Z7982 Long term (current) use of aspirin: Secondary | ICD-10-CM | POA: Diagnosis not present

## 2018-11-01 DIAGNOSIS — Z9181 History of falling: Secondary | ICD-10-CM | POA: Diagnosis not present

## 2018-11-04 DIAGNOSIS — Z7982 Long term (current) use of aspirin: Secondary | ICD-10-CM | POA: Diagnosis not present

## 2018-11-04 DIAGNOSIS — Z794 Long term (current) use of insulin: Secondary | ICD-10-CM | POA: Diagnosis not present

## 2018-11-04 DIAGNOSIS — I69351 Hemiplegia and hemiparesis following cerebral infarction affecting right dominant side: Secondary | ICD-10-CM | POA: Diagnosis not present

## 2018-11-04 DIAGNOSIS — E119 Type 2 diabetes mellitus without complications: Secondary | ICD-10-CM | POA: Diagnosis not present

## 2018-11-04 DIAGNOSIS — Z87440 Personal history of urinary (tract) infections: Secondary | ICD-10-CM | POA: Diagnosis not present

## 2018-11-04 DIAGNOSIS — I10 Essential (primary) hypertension: Secondary | ICD-10-CM | POA: Diagnosis not present

## 2018-11-04 DIAGNOSIS — G309 Alzheimer's disease, unspecified: Secondary | ICD-10-CM | POA: Diagnosis not present

## 2018-11-04 DIAGNOSIS — I639 Cerebral infarction, unspecified: Secondary | ICD-10-CM | POA: Diagnosis not present

## 2018-11-04 DIAGNOSIS — I69320 Aphasia following cerebral infarction: Secondary | ICD-10-CM | POA: Diagnosis not present

## 2018-11-04 DIAGNOSIS — Z9181 History of falling: Secondary | ICD-10-CM | POA: Diagnosis not present

## 2018-11-20 ENCOUNTER — Other Ambulatory Visit: Payer: Self-pay

## 2018-11-20 ENCOUNTER — Inpatient Hospital Stay (HOSPITAL_COMMUNITY)
Admission: EM | Admit: 2018-11-20 | Discharge: 2018-11-27 | DRG: 070 | Disposition: A | Discharge status: Hospice | Payer: Medicare Other | Attending: Family Medicine | Admitting: Family Medicine

## 2018-11-20 ENCOUNTER — Encounter (HOSPITAL_COMMUNITY): Payer: Self-pay | Admitting: Emergency Medicine

## 2018-11-20 ENCOUNTER — Emergency Department (HOSPITAL_COMMUNITY): Payer: Medicare Other

## 2018-11-20 DIAGNOSIS — Z794 Long term (current) use of insulin: Secondary | ICD-10-CM | POA: Diagnosis not present

## 2018-11-20 DIAGNOSIS — I69351 Hemiplegia and hemiparesis following cerebral infarction affecting right dominant side: Secondary | ICD-10-CM

## 2018-11-20 DIAGNOSIS — R509 Fever, unspecified: Secondary | ICD-10-CM | POA: Diagnosis not present

## 2018-11-20 DIAGNOSIS — E876 Hypokalemia: Secondary | ICD-10-CM | POA: Diagnosis not present

## 2018-11-20 DIAGNOSIS — Z7189 Other specified counseling: Secondary | ICD-10-CM | POA: Diagnosis not present

## 2018-11-20 DIAGNOSIS — E119 Type 2 diabetes mellitus without complications: Secondary | ICD-10-CM | POA: Diagnosis not present

## 2018-11-20 DIAGNOSIS — I10 Essential (primary) hypertension: Secondary | ICD-10-CM | POA: Diagnosis present

## 2018-11-20 DIAGNOSIS — K5641 Fecal impaction: Secondary | ICD-10-CM | POA: Diagnosis present

## 2018-11-20 DIAGNOSIS — R4701 Aphasia: Secondary | ICD-10-CM | POA: Diagnosis present

## 2018-11-20 DIAGNOSIS — Z7982 Long term (current) use of aspirin: Secondary | ICD-10-CM | POA: Diagnosis not present

## 2018-11-20 DIAGNOSIS — G9341 Metabolic encephalopathy: Secondary | ICD-10-CM | POA: Diagnosis present

## 2018-11-20 DIAGNOSIS — E870 Hyperosmolality and hypernatremia: Secondary | ICD-10-CM | POA: Diagnosis present

## 2018-11-20 DIAGNOSIS — E1165 Type 2 diabetes mellitus with hyperglycemia: Secondary | ICD-10-CM | POA: Diagnosis not present

## 2018-11-20 DIAGNOSIS — N179 Acute kidney failure, unspecified: Secondary | ICD-10-CM | POA: Diagnosis present

## 2018-11-20 DIAGNOSIS — R000 Tachycardia, unspecified: Secondary | ICD-10-CM | POA: Diagnosis not present

## 2018-11-20 DIAGNOSIS — R4182 Altered mental status, unspecified: Secondary | ICD-10-CM

## 2018-11-20 DIAGNOSIS — R739 Hyperglycemia, unspecified: Secondary | ICD-10-CM | POA: Diagnosis present

## 2018-11-20 DIAGNOSIS — Z79899 Other long term (current) drug therapy: Secondary | ICD-10-CM

## 2018-11-20 DIAGNOSIS — F0390 Unspecified dementia without behavioral disturbance: Secondary | ICD-10-CM | POA: Diagnosis present

## 2018-11-20 DIAGNOSIS — Z515 Encounter for palliative care: Secondary | ICD-10-CM

## 2018-11-20 DIAGNOSIS — E1100 Type 2 diabetes mellitus with hyperosmolarity without nonketotic hyperglycemic-hyperosmolar coma (NKHHC): Secondary | ICD-10-CM | POA: Diagnosis not present

## 2018-11-20 DIAGNOSIS — R740 Nonspecific elevation of levels of transaminase and lactic acid dehydrogenase [LDH]: Secondary | ICD-10-CM | POA: Diagnosis present

## 2018-11-20 DIAGNOSIS — K429 Umbilical hernia without obstruction or gangrene: Secondary | ICD-10-CM | POA: Diagnosis not present

## 2018-11-20 DIAGNOSIS — R29810 Facial weakness: Secondary | ICD-10-CM | POA: Diagnosis not present

## 2018-11-20 DIAGNOSIS — Z743 Need for continuous supervision: Secondary | ICD-10-CM | POA: Diagnosis not present

## 2018-11-20 DIAGNOSIS — R1310 Dysphagia, unspecified: Secondary | ICD-10-CM | POA: Diagnosis not present

## 2018-11-20 DIAGNOSIS — Z7902 Long term (current) use of antithrombotics/antiplatelets: Secondary | ICD-10-CM | POA: Diagnosis not present

## 2018-11-20 DIAGNOSIS — G9340 Encephalopathy, unspecified: Secondary | ICD-10-CM | POA: Diagnosis present

## 2018-11-20 LAB — COMPREHENSIVE METABOLIC PANEL
ALT: 74 U/L — AB (ref 0–44)
AST: 73 U/L — AB (ref 15–41)
Albumin: 2.3 g/dL — ABNORMAL LOW (ref 3.5–5.0)
Alkaline Phosphatase: 269 U/L — ABNORMAL HIGH (ref 38–126)
Anion gap: 13 (ref 5–15)
BUN: 30 mg/dL — AB (ref 8–23)
CO2: 29 mmol/L (ref 22–32)
CREATININE: 1.46 mg/dL — AB (ref 0.44–1.00)
Calcium: 8.6 mg/dL — ABNORMAL LOW (ref 8.9–10.3)
Chloride: 109 mmol/L (ref 98–111)
GFR calc Af Amer: 40 mL/min — ABNORMAL LOW (ref >60)
GFR calc non Af Amer: 34 mL/min — ABNORMAL LOW (ref >60)
Glucose, Bld: 936 mg/dL (ref 70–99)
Potassium: 3.1 mmol/L — ABNORMAL LOW (ref 3.5–5.1)
Sodium: 151 mmol/L — ABNORMAL HIGH (ref 135–145)
Total Bilirubin: 0.9 mg/dL (ref 0.3–1.2)
Total Protein: 8.7 g/dL — ABNORMAL HIGH (ref 6.5–8.1)

## 2018-11-20 LAB — CBC
HEMATOCRIT: 39.4 % (ref 36.0–46.0)
Hemoglobin: 11.7 g/dL — ABNORMAL LOW (ref 12.0–15.0)
MCH: 28.5 pg (ref 26.0–34.0)
MCHC: 29.7 g/dL — AB (ref 30.0–36.0)
MCV: 95.9 fL (ref 80.0–100.0)
Platelets: 146 10*3/uL — ABNORMAL LOW (ref 150–400)
RBC: 4.11 MIL/uL (ref 3.87–5.11)
RDW: 16.6 % — AB (ref 11.5–15.5)
WBC: 10.5 10*3/uL (ref 4.0–10.5)
nRBC: 0 % (ref 0.0–0.2)

## 2018-11-20 LAB — I-STAT VENOUS BLOOD GAS, ED
Acid-Base Excess: 2 mmol/L (ref 0.0–2.0)
Bicarbonate: 28.4 mmol/L — ABNORMAL HIGH (ref 20.0–28.0)
O2 Saturation: 67 %
Patient temperature: 97.6
TCO2: 30 mmol/L (ref 22–32)
pCO2, Ven: 51.6 mmHg (ref 44.0–60.0)
pH, Ven: 7.346 (ref 7.250–7.430)
pO2, Ven: 36 mmHg (ref 32.0–45.0)

## 2018-11-20 LAB — I-STAT TROPONIN, ED: Troponin i, poc: 0.07 ng/mL (ref 0.00–0.08)

## 2018-11-20 LAB — I-STAT CG4 LACTIC ACID, ED
Lactic Acid, Venous: 2.73 mmol/L (ref 0.5–1.9)
Lactic Acid, Venous: 5.09 mmol/L (ref 0.5–1.9)

## 2018-11-20 MED ORDER — DEXTROSE-NACL 5-0.45 % IV SOLN: INTRAVENOUS | Status: DC

## 2018-11-20 MED ORDER — POTASSIUM CHLORIDE 10 MEQ/100ML IV SOLN
10.0000 meq | INTRAVENOUS | Status: DC
  Administered 2018-11-20 (×2): 10 meq via INTRAVENOUS
  Filled 2018-11-20 (×3): qty 100

## 2018-11-20 MED ORDER — SODIUM CHLORIDE 0.9 % IV BOLUS
1000.0000 mL | Freq: Once | INTRAVENOUS | Status: AC
Start: 2018-11-20 — End: 2018-11-20
  Administered 2018-11-20: 1000 mL via INTRAVENOUS

## 2018-11-20 MED ORDER — SODIUM CHLORIDE 0.9 % IV SOLN
Freq: Once | INTRAVENOUS | Status: AC
  Administered 2018-11-20: 23:00:00 via INTRAVENOUS

## 2018-11-20 MED ORDER — IOHEXOL 300 MG/ML  SOLN
80.0000 mL | Freq: Once | INTRAMUSCULAR | Status: AC | PRN
  Administered 2018-11-20: 100 mL via INTRAVENOUS

## 2018-11-20 MED ORDER — INSULIN REGULAR(HUMAN) IN NACL 100-0.9 UT/100ML-% IV SOLN
INTRAVENOUS | Status: DC
  Administered 2018-11-20: 5.4 [IU]/h via INTRAVENOUS
  Filled 2018-11-20: qty 100

## 2018-11-20 NOTE — ED Notes (Signed)
CBG HIGH. Malfunction to glucometer

## 2018-11-20 NOTE — ED Notes (Signed)
CBG 412  Wont transmit

## 2018-11-20 NOTE — ED Triage Notes (Signed)
Per EMS pt from home. Daughter states she took meds and ate breakfast as normal this morning. Stroke in July resulting in right sided deficits. Pt has dementia at baseline. Family reports increased altered mental status this afternoon. Reading "High" on glucometer. Did take her normal 10 u of insulin this morning. EKG NSR. Hx of afib. 20g L wrist.

## 2018-11-20 NOTE — ED Provider Notes (Addendum)
[place] EMERGENCY DEPARTMENT Provider Note   CSN: [id number] Arrival date & time: 11/20/18  1901     History   Chief Complaint Chief Complaint  Patient presents with  . Altered Mental Status    HPI Diana Hartman is a 77 y.o. female history of dementia, diabetes, hypertension, stroke (with right-sided residual deficits) and by daughters from home for evaluation of altered mental status.  They report that since this afternoon, patient has been less responsive than normal.  Reports that patient does have a baseline history of dementia but states she is usually alert and will respond to questions.  They state her blood sugars have been slightly elevated and reports that yesterday they are in the 400s.  Patient has not been complaining of any pain and has not any vomiting or fever.  The history is provided by the patient.    Past Medical History:  Diagnosis Date  . Dementia ([place])   . Diabetes mellitus   . Hypertension   . Stroke ([place])   . Temporal arteritis [facility])     Patient Active Problem List   Diagnosis Date Noted  . Ischemic stroke ([place])   . Bradycardia   . Thrombocytopenia ([place])   . Acute blood loss anemia   . Diabetes mellitus type 2 in nonobese ([facility])   . Benign essential HTN   . Depression   . CVA (cerebral vascular accident) ([place]) 06/28/2018  . Gait disturbance 05/04/2018  . General weakness 05/04/2018  . Pain and swelling of knee 05/04/2018  . Protein-calorie malnutrition, severe 05/18/2016  . Lower urinary tract infectious disease 05/17/2016  . Hyperglycemia 05/17/2016  . Hypernatremia 05/17/2016  . Dehydration 05/17/2016  . Acute encephalopathy 05/17/2016  . Acute cystitis without hematuria   . Hyperglycemia without ketosis   . Insulin dependent diabetes mellitus ([place])   . Hypoglycemia 07/04/2015  . Diabetes mellitus, type II ([place]) 07/04/2015  . Essential hypertension 07/04/2015  . Dementia without behavioral disturbance ([place])  07/04/2015  . Fall 07/04/2015    Past Surgical History:  Procedure Laterality Date  . NO PAST SURGERIES       OB History   No obstetric history on file.      Home Medications    Prior to Admission medications   Medication Sig Start Date End Date Taking? Authorizing Provider  aspirin EC 81 MG EC tablet Take 1 tablet (81 mg total) by mouth daily. 07/05/18  Yes Caren Griffins, MD  atorvastatin (LIPITOR) 80 MG tablet Take 1 tablet (80 mg total) by mouth daily at 6 PM. 07/04/18  Yes Gherghe, Vella Redhead, MD  Calcium Carbonate-Vitamin D (CALCIUM 600+D) 600-200 MG-UNIT TABS Take 1 tablet by mouth daily.   Yes [provider]  clopidogrel (PLAVIX) 75 MG tablet Take 1 tablet (75 mg total) by mouth daily. 07/05/18  Yes Gherghe, Vella Redhead, MD  diltiazem (CARDIZEM) 120 MG tablet Take 120 mg by mouth 2 (two) times daily.    Yes [provider]  donepezil (ARICEPT) 10 MG tablet Take 10 mg by mouth daily.   Yes [provider]  glipiZIDE (GLUCOTROL) 10 MG tablet Take 10 mg by mouth daily. 06/14/18  Yes [provider]  insulin detemir (LEVEMIR) 100 UNIT/ML injection Inject 0.1 mLs (10 Units total) into the skin 2 (two) times daily. 07/06/15  Yes Gherghe, Vella Redhead, MD  lisinopril (PRINIVIL,ZESTRIL) 40 MG tablet Take 40 mg by mouth daily.     Yes [provider]  Multiple  Vitamin (MULTIVITAMIN) tablet Take 1 tablet by mouth daily.   Yes [provider]  nebivolol (BYSTOLIC) 5 MG tablet Take 5 mg by mouth daily.   Yes [provider]  sertraline (ZOLOFT) 50 MG tablet Take 50 mg by mouth at bedtime.    Yes [provider]  zolpidem (AMBIEN) 10 MG tablet Take 10 mg by mouth at bedtime as needed for sleep.  05/31/15  Yes [provider]  acetaminophen (TYLENOL) 325 MG tablet Take 2 tablets (650 mg total) by mouth every 6 (six) hours as needed for mild pain (or Fever >/= 101). Patient not taking: Reported on 11/20/2018 05/19/16    Regalado, Jerald Kief A, MD  GLUCERNA (GLUCERNA) LIQD Take 237 mLs by mouth 2 (two) times daily between meals. Patient taking differently: Take 237 mLs by mouth as needed.  05/19/16   Regalado, Belkys A, MD  traMADol (ULTRAM) 50 MG tablet Take 1 tablet (50 mg total) by mouth every 6 (six) hours as needed. Patient not taking: Reported on 11/20/2018 07/04/18   Caren Griffins, MD    Family History Family History  Problem Relation Age of Onset  . Diabetes type II Other   . Hypertension Other     Social History Social History   Tobacco Use  . Smoking status: Never Smoker  . Smokeless tobacco: Never Used  Substance Use Topics  . Alcohol use: No  . Drug use: No     Allergies   Patient has no known allergies.   Review of Systems Review of Systems  Unable to perform ROS: Mental status change  All other systems reviewed and are negative.    Physical Exam Updated Vital Signs BP (!) 137/57   Pulse 84   Temp 97.6 F (36.4 C) (Axillary)   Resp (!) 22   SpO2 98%   Physical Exam Vitals signs and nursing note reviewed.  Constitutional:      Comments: Frail and elderly-appearing  HENT:     Head: Normocephalic and atraumatic.  Eyes:     General: Lids are normal.     Conjunctiva/sclera: Conjunctivae normal.     Pupils: Pupils are equal, round, and reactive to light.  Neck:     Musculoskeletal: Full passive range of motion without pain.  Cardiovascular:     Rate and Rhythm: Regular rhythm. Tachycardia present.     Pulses: Normal pulses.          Radial pulses are 2+ on the right side and 2+ on the left side.       Dorsalis pedis pulses are 2+ on the right side and 2+ on the left side.     Heart sounds: Normal heart sounds. No murmur. No friction rub. No gallop.   Pulmonary:     Effort: Pulmonary effort is normal.     Breath sounds: Normal breath sounds.  Abdominal:     Palpations: Abdomen is soft. Abdomen is not rigid.     Tenderness: There is generalized abdominal  tenderness. There is no guarding.     Comments: Generalized tenderness noted.  Abdomen is soft, nondistended.  She does have an umbilical hernia that appears soft without any signs of warmth or erythema.  Musculoskeletal: Normal range of motion.  Skin:    General: Skin is warm and dry.     Capillary Refill: Capillary refill takes less than 2 seconds.  Neurological:     Mental Status: She is alert.     Comments: Groans and opens eyes in  response to pain.  Psychiatric:        Speech: Speech normal.      ED Treatments / Results  Labs (all labs ordered are listed, but only abnormal results are displayed) Labs Reviewed  COMPREHENSIVE METABOLIC PANEL - Abnormal; Notable for the following components:      Result Value   Sodium 151 (*)    Potassium 3.1 (*)    Glucose, Bld 936 (*)    BUN 30 (*)    Creatinine, Ser 1.46 (*)    Calcium 8.6 (*)    Total Protein 8.7 (*)    Albumin 2.3 (*)    AST 73 (*)    ALT 74 (*)    Alkaline Phosphatase 269 (*)    GFR calc non Af Amer 34 (*)    GFR calc Af Amer 40 (*)    All other components within normal limits  CBC - Abnormal; Notable for the following components:   Hemoglobin 11.7 (*)    MCHC 29.7 (*)    RDW 16.6 (*)    Platelets 146 (*)    All other components within normal limits  CBG MONITORING, ED - Abnormal; Notable for the following components:   Glucose-Capillary >600 (*)    All other components within normal limits  I-STAT CG4 LACTIC ACID, ED - Abnormal; Notable for the following components:   Lactic Acid, Venous 2.73 (*)    All other components within normal limits  I-STAT CG4 LACTIC ACID, ED - Abnormal; Notable for the following components:   Lactic Acid, Venous 5.09 (*)    All other components within normal limits  CBG MONITORING, ED - Abnormal; Notable for the following components:   Glucose-Capillary >600 (*)    All other components within normal limits  I-STAT VENOUS BLOOD GAS, ED - Abnormal; Notable for the following  components:   Bicarbonate 28.4 (*)    All other components within normal limits  CBG MONITORING, ED - Abnormal; Notable for the following components:   Glucose-Capillary 412 (*)    All other components within normal limits  CBG MONITORING, ED - Abnormal; Notable for the following components:   Glucose-Capillary 260 (*)    All other components within normal limits  URINALYSIS, ROUTINE W REFLEX MICROSCOPIC  BLOOD GAS, VENOUS  I-STAT TROPONIN, ED    EKG None  Radiology Ct Head Wo Contrast  Result Date: 11/20/2018 CLINICAL DATA:  Altered mental status.  Hyperglycemic. EXAM: CT HEAD WITHOUT CONTRAST TECHNIQUE: Contiguous axial images were obtained from the base of the skull through the vertex without intravenous contrast. COMPARISON:  MRI of the head June 28, 2018 FINDINGS: BRAIN: No intraparenchymal hemorrhage, mass effect nor midline shift. Severe parenchymal brain volume loss most notable within mesial occipital lobes. No hydrocephalus. Old LEFT cerebellar infarct. Bifrontal encephalomalacia. Patchy supratentorial white matter hypodensities less than expected for patient's age, though non-specific are most compatible with chronic small vessel ischemic disease. No acute large vascular territory infarcts. No abnormal extra-axial fluid collections. Basal cisterns are patent. VASCULAR: Moderate calcific atherosclerosis of the carotid siphons. SKULL: No skull fracture. Borderline empty sella. No significant scalp soft tissue swelling. SINUSES/ORBITS: Trace paranasal sinus mucosal thickening. Mastoid air cells are well aerated.The included ocular globes and orbital contents are non-suspicious. Status post bilateral ocular lens implants. OTHER: None. IMPRESSION: 1. No acute intracranial process. 2. Severe parenchymal brain volume loss most notable within mesial temporal lobes associated with neurodegenerative syndromes. 3. Old bifrontal/MCA territory infarcts. Old small LEFT cerebellar infarct.  Electronically Signed  By: Elon Alas M.D.   On: 11/20/2018 22:57   Ct Abdomen Pelvis W Contrast  Result Date: 11/20/2018 CLINICAL DATA:  Abdominal pain. EXAM: CT ABDOMEN AND PELVIS WITH CONTRAST TECHNIQUE: Multidetector CT imaging of the abdomen and pelvis was performed using the standard protocol following bolus administration of intravenous contrast. CONTRAST:  160m OMNIPAQUE IOHEXOL 300 MG/ML  SOLN COMPARISON:  None. FINDINGS: Lower chest: No acute abnormality. Subsegmental atelectasis in the right lower lobe. Hepatobiliary: No focal liver abnormality is seen. No gallstones, gallbladder wall thickening, or biliary dilatation. Pancreas: Unremarkable. No pancreatic ductal dilatation or surrounding inflammatory changes. Spleen: Normal in size without focal abnormality. Adrenals/Urinary Tract: The adrenal glands are unremarkable. Small bilateral renal cysts. Other subcentimeter low-density lesions in both kidneys are too small to characterize. No renal calculi or hydronephrosis. Mild circumferential bladder wall thickening. Stomach/Bowel: Stomach is within normal limits. Appendix appears normal. No evidence of bowel wall thickening, distention, or inflammatory changes. Large stool ball in the rectum. Vascular/Lymphatic: Aortic atherosclerosis. No enlarged abdominal or pelvic lymph nodes. Reproductive: Small amount of fluid and air within the endometrial canal. No adnexal mass. Other: Right paraumbilical hernia containing fat and non-dilated transverse colon. No free fluid or pneumoperitoneum. Musculoskeletal: No acute or significant osseous findings. Severe degenerative disc disease of the lower lumbar spine with grade 1 anterolisthesis at L3-L4. IMPRESSION: 1. Large stool ball in the rectum. Correlate for fecal impaction. No significant surrounding inflammatory changes to suggest stercoral colitis. 2. Mild circumferential bladder wall thickening. Correlate with urinalysis to exclude cystitis. 3. Right  paraumbilical hernia containing fat and non-dilated transverse colon. 4. Small amount of fluid and air within the endometrial canal. Correlate with any history of recent pelvic exam and for signs or symptoms of endometritis. 5.  Aortic atherosclerosis (ICD10-I70.0). Electronically Signed   By: WTitus DubinM.D.   On: 11/20/2018 23:12    Procedures .Critical Care Performed by: LVolanda Napoleon PA-C Authorized by: LVolanda Napoleon PA-C   Critical care provider statement:    Critical care time (minutes):  45   Critical care was necessary to treat or prevent imminent or life-threatening deterioration of the following conditions:  Endocrine crisis, renal failure, respiratory failure and metabolic crisis   Critical care was time spent personally by me on the following activities:  Discussions with consultants, evaluation of patient's response to treatment, examination of patient, ordering and performing treatments and interventions, ordering and review of laboratory studies, ordering and review of radiographic studies, pulse oximetry, re-evaluation of patient's condition, obtaining history from patient or surrogate and review of old charts   (including critical care time)  Medications Ordered in ED Medications  insulin regular, human (MYXREDLIN) 100 units/ 100 mL infusion (7 Units/hr Intravenous Rate/Dose Change 11/20/18 2312)  dextrose 5 %-0.45 % sodium chloride infusion (has no administration in time range)  potassium chloride 10 mEq in 100 mL IVPB (0 mEq Intravenous Stopped 11/21/18 0009)  sodium chloride 0.9 % bolus 1,000 mL (has no administration in time range)  sodium chloride 0.9 % bolus 1,000 mL (0 mLs Intravenous Stopped 11/20/18 2305)  0.9 %  sodium chloride infusion ( Intravenous New Bag/Given 11/20/18 2326)  iohexol (OMNIPAQUE) 300 MG/ML solution 80 mL (100 mLs Intravenous Contrast Given 11/20/18 2217)     Initial Impression / Assessment and Plan / ED Course  I have reviewed  the triage vital signs and the nursing notes.  Pertinent labs & imaging results that were available during my care of the patient were reviewed by  me and considered in my medical decision making (see chart for details).     77 year old female past history of diabetes brought in for altered mental status.  Family reports she has been lethargic since this afternoon.  No vomiting, fever.  Patient responds and groans to pain but otherwise will not answer questions or follow commands.  She does seem to grimace the most when I pressed on her abdomen.  She does have an umbilical hernia that is easily able to be pushed in.  No overlying warmth, erythema.  And to check labs, urine.  CBC shows no leukocytosis.  Hemoglobin is 11.7.  CMP shows sodium 151, potassium 3.1.  Glucose is elevated at 936.  Bicarb is normal.  BUN and creatinine are elevated at 30 and 1.46.  This is increased from her baseline.  Her AST and ALT are slightly elevated as well as her alk phos.  No recent for comparison.  Initial troponin negative.  Given hyperglycemia, will plan to start patient on insulin drip.  Her lactic acid is elevated though given her hypernatremia, will plan to give slow fluids.  CT head shows no evidence of acute intracranial process.  CT on pelvis shows large stool ball in the rectum.  She has mild circumferential bladder wall thickening noted to.  Additionally, patient has a right periumbilical hernia containing fat nondilated transverse colon.   Given altered mental status, hyperglycemia, AKI, findings on CT scan, will plan for admission for further evaluation.  Discussed patient with Dr. Alcario Drought (hospitalist). Will accept patient for admission.   Final Clinical Impressions(s) / ED Diagnoses   Final diagnoses:  Altered mental status, unspecified altered mental status type  Hypernatremia  Hyperglycemia    ED Discharge Orders    None       Volanda Napoleon, PA-C 11/21/18 0102    Volanda Napoleon,  PA-C 11/21/18 0103    Julianne Rice, MD 11/25/18 1053

## 2018-11-20 NOTE — ED Notes (Signed)
2nd run of potassium started at 2150

## 2018-11-21 ENCOUNTER — Encounter (HOSPITAL_COMMUNITY): Payer: Self-pay | Admitting: Primary Care

## 2018-11-21 ENCOUNTER — Other Ambulatory Visit: Payer: Self-pay

## 2018-11-21 DIAGNOSIS — Z79899 Other long term (current) drug therapy: Secondary | ICD-10-CM | POA: Diagnosis not present

## 2018-11-21 DIAGNOSIS — E870 Hyperosmolality and hypernatremia: Secondary | ICD-10-CM

## 2018-11-21 DIAGNOSIS — I10 Essential (primary) hypertension: Secondary | ICD-10-CM | POA: Diagnosis not present

## 2018-11-21 DIAGNOSIS — R740 Nonspecific elevation of levels of transaminase and lactic acid dehydrogenase [LDH]: Secondary | ICD-10-CM | POA: Diagnosis present

## 2018-11-21 DIAGNOSIS — E119 Type 2 diabetes mellitus without complications: Secondary | ICD-10-CM

## 2018-11-21 DIAGNOSIS — G9341 Metabolic encephalopathy: Secondary | ICD-10-CM | POA: Diagnosis not present

## 2018-11-21 DIAGNOSIS — Z7902 Long term (current) use of antithrombotics/antiplatelets: Secondary | ICD-10-CM | POA: Diagnosis not present

## 2018-11-21 DIAGNOSIS — Z794 Long term (current) use of insulin: Secondary | ICD-10-CM | POA: Diagnosis not present

## 2018-11-21 DIAGNOSIS — Z515 Encounter for palliative care: Secondary | ICD-10-CM

## 2018-11-21 DIAGNOSIS — E1100 Type 2 diabetes mellitus with hyperosmolarity without nonketotic hyperglycemic-hyperosmolar coma (NKHHC): Secondary | ICD-10-CM | POA: Diagnosis present

## 2018-11-21 DIAGNOSIS — I69351 Hemiplegia and hemiparesis following cerebral infarction affecting right dominant side: Secondary | ICD-10-CM | POA: Diagnosis not present

## 2018-11-21 DIAGNOSIS — K5641 Fecal impaction: Secondary | ICD-10-CM | POA: Diagnosis present

## 2018-11-21 DIAGNOSIS — N179 Acute kidney failure, unspecified: Secondary | ICD-10-CM | POA: Diagnosis present

## 2018-11-21 DIAGNOSIS — R739 Hyperglycemia, unspecified: Secondary | ICD-10-CM

## 2018-11-21 DIAGNOSIS — R1310 Dysphagia, unspecified: Secondary | ICD-10-CM | POA: Diagnosis present

## 2018-11-21 DIAGNOSIS — Z7189 Other specified counseling: Secondary | ICD-10-CM | POA: Diagnosis not present

## 2018-11-21 DIAGNOSIS — F0390 Unspecified dementia without behavioral disturbance: Secondary | ICD-10-CM

## 2018-11-21 DIAGNOSIS — E876 Hypokalemia: Secondary | ICD-10-CM | POA: Diagnosis not present

## 2018-11-21 DIAGNOSIS — Z7982 Long term (current) use of aspirin: Secondary | ICD-10-CM | POA: Diagnosis not present

## 2018-11-21 DIAGNOSIS — R4701 Aphasia: Secondary | ICD-10-CM | POA: Diagnosis present

## 2018-11-21 LAB — BASIC METABOLIC PANEL
Anion gap: 11 (ref 5–15)
Anion gap: 8 (ref 5–15)
Anion gap: 5 (ref 5–15)
Anion gap: 6 (ref 5–15)
Anion gap: 9 (ref 5–15)
BUN: 24 mg/dL — ABNORMAL HIGH (ref 8–23)
BUN: 21 mg/dL (ref 8–23)
BUN: 21 mg/dL (ref 8–23)
BUN: 20 mg/dL (ref 8–23)
BUN: 19 mg/dL (ref 8–23)
CO2: 26 mmol/L (ref 22–32)
CO2: 28 mmol/L (ref 22–32)
CO2: 29 mmol/L (ref 22–32)
CO2: 28 mmol/L (ref 22–32)
CO2: 27 mmol/L (ref 22–32)
CREATININE: 1.12 mg/dL — AB (ref 0.44–1.00)
Calcium: 8.5 mg/dL — ABNORMAL LOW (ref 8.9–10.3)
Calcium: 8.6 mg/dL — ABNORMAL LOW (ref 8.9–10.3)
Calcium: 8.6 mg/dL — ABNORMAL LOW (ref 8.9–10.3)
Calcium: 8.5 mg/dL — ABNORMAL LOW (ref 8.9–10.3)
Calcium: 8.6 mg/dL — ABNORMAL LOW (ref 8.9–10.3)
Chloride: 121 mmol/L — ABNORMAL HIGH (ref 98–111)
Chloride: 122 mmol/L — ABNORMAL HIGH (ref 98–111)
Chloride: 121 mmol/L — ABNORMAL HIGH (ref 98–111)
Chloride: 120 mmol/L — ABNORMAL HIGH (ref 98–111)
Chloride: 119 mmol/L — ABNORMAL HIGH (ref 98–111)
Creatinine, Ser: 0.98 mg/dL (ref 0.44–1.00)
Creatinine, Ser: 1.12 mg/dL — ABNORMAL HIGH (ref 0.44–1.00)
Creatinine, Ser: 1 mg/dL (ref 0.44–1.00)
Creatinine, Ser: 0.99 mg/dL (ref 0.44–1.00)
GFR calc Af Amer: 54 mL/min — ABNORMAL LOW (ref >60)
GFR calc Af Amer: >60 mL/min (ref >60)
GFR calc Af Amer: 54 mL/min — ABNORMAL LOW (ref >60)
GFR calc Af Amer: >60 mL/min (ref >60)
GFR calc Af Amer: >60 mL/min (ref >60)
GFR calc non Af Amer: 47 mL/min — ABNORMAL LOW (ref >60)
GFR calc non Af Amer: 55 mL/min — ABNORMAL LOW (ref >60)
GFR calc non Af Amer: 47 mL/min — ABNORMAL LOW (ref >60)
GFR calc non Af Amer: 54 mL/min — ABNORMAL LOW (ref >60)
GFR calc non Af Amer: 55 mL/min — ABNORMAL LOW (ref >60)
GLUCOSE: 110 mg/dL — AB (ref 70–99)
GLUCOSE: 191 mg/dL — AB (ref 70–99)
GLUCOSE: 168 mg/dL — AB (ref 70–99)
Glucose, Bld: 250 mg/dL — ABNORMAL HIGH (ref 70–99)
Glucose, Bld: 184 mg/dL — ABNORMAL HIGH (ref 70–99)
Potassium: 3.2 mmol/L — ABNORMAL LOW (ref 3.5–5.1)
Potassium: 3.5 mmol/L (ref 3.5–5.1)
Potassium: 3.3 mmol/L — ABNORMAL LOW (ref 3.5–5.1)
Potassium: 3.2 mmol/L — ABNORMAL LOW (ref 3.5–5.1)
Potassium: 3.1 mmol/L — ABNORMAL LOW (ref 3.5–5.1)
Sodium: 158 mmol/L — ABNORMAL HIGH (ref 135–145)
Sodium: 158 mmol/L — ABNORMAL HIGH (ref 135–145)
Sodium: 155 mmol/L — ABNORMAL HIGH (ref 135–145)
Sodium: 154 mmol/L — ABNORMAL HIGH (ref 135–145)
Sodium: 155 mmol/L — ABNORMAL HIGH (ref 135–145)

## 2018-11-21 LAB — GLUCOSE, CAPILLARY
Glucose-Capillary: 147 mg/dL — ABNORMAL HIGH (ref 70–99)
Glucose-Capillary: 275 mg/dL — ABNORMAL HIGH (ref 70–99)

## 2018-11-21 LAB — CBG MONITORING, ED
Glucose-Capillary: 118 mg/dL — ABNORMAL HIGH (ref 70–99)
Glucose-Capillary: 135 mg/dL — ABNORMAL HIGH (ref 70–99)
Glucose-Capillary: 144 mg/dL — ABNORMAL HIGH (ref 70–99)
Glucose-Capillary: 165 mg/dL — ABNORMAL HIGH (ref 70–99)
Glucose-Capillary: 171 mg/dL — ABNORMAL HIGH (ref 70–99)
Glucose-Capillary: 189 mg/dL — ABNORMAL HIGH (ref 70–99)
Glucose-Capillary: 260 mg/dL — ABNORMAL HIGH (ref 70–99)
Glucose-Capillary: 412 mg/dL — ABNORMAL HIGH (ref 70–99)
Glucose-Capillary: >600 mg/dL (ref 70–99)

## 2018-11-21 LAB — HEPATIC FUNCTION PANEL
ALT: 62 U/L — AB (ref 0–44)
AST: 67 U/L — ABNORMAL HIGH (ref 15–41)
Albumin: 2.1 g/dL — ABNORMAL LOW (ref 3.5–5.0)
Alkaline Phosphatase: 225 U/L — ABNORMAL HIGH (ref 38–126)
Bilirubin, Direct: 0.3 mg/dL — ABNORMAL HIGH (ref 0.0–0.2)
Indirect Bilirubin: 0.4 mg/dL (ref 0.3–0.9)
Total Bilirubin: 0.7 mg/dL (ref 0.3–1.2)
Total Protein: 8 g/dL (ref 6.5–8.1)

## 2018-11-21 MED ORDER — INSULIN ASPART 100 UNIT/ML ~~LOC~~ SOLN
0.0000 [IU] | SUBCUTANEOUS | Status: DC
  Administered 2018-11-21: 5 [IU] via SUBCUTANEOUS
  Administered 2018-11-21: 1 [IU] via SUBCUTANEOUS
  Administered 2018-11-21 (×2): 2 [IU] via SUBCUTANEOUS
  Administered 2018-11-22: 3 [IU] via SUBCUTANEOUS
  Administered 2018-11-22: 7 [IU] via SUBCUTANEOUS

## 2018-11-21 MED ORDER — POTASSIUM CHLORIDE 10 MEQ/100ML IV SOLN
10.0000 meq | INTRAVENOUS | Status: AC
  Administered 2018-11-21 (×2): 10 meq via INTRAVENOUS
  Filled 2018-11-21: qty 100

## 2018-11-21 MED ORDER — MILK AND MOLASSES ENEMA
1.0000 | Freq: Once | RECTAL | Status: AC
  Administered 2018-11-21: 250 mL via RECTAL
  Filled 2018-11-21: qty 250

## 2018-11-21 MED ORDER — SODIUM CHLORIDE 0.9 % IV SOLN: INTRAVENOUS | Status: DC

## 2018-11-21 MED ORDER — ENOXAPARIN SODIUM 30 MG/0.3ML ~~LOC~~ SOLN
30.0000 mg | SUBCUTANEOUS | Status: DC
  Administered 2018-11-21 – 2018-11-27 (×7): 30 mg via SUBCUTANEOUS
  Filled 2018-11-21 (×7): qty 0.3

## 2018-11-21 MED ORDER — SODIUM CHLORIDE 0.45 % IV SOLN
INTRAVENOUS | Status: DC
  Administered 2018-11-21 – 2018-11-23 (×6): via INTRAVENOUS

## 2018-11-21 MED ORDER — SODIUM CHLORIDE 0.9 % IV BOLUS
1000.0000 mL | Freq: Once | INTRAVENOUS | Status: AC
  Administered 2018-11-21: 1000 mL via INTRAVENOUS

## 2018-11-21 MED ORDER — DEXTROSE-NACL 5-0.45 % IV SOLN
INTRAVENOUS | Status: DC
  Administered 2018-11-21: 02:00:00 via INTRAVENOUS

## 2018-11-21 NOTE — ED Notes (Signed)
3rd run of potassium started 

## 2018-11-21 NOTE — ED Notes (Signed)
Family at bedside. 

## 2018-11-21 NOTE — Consult Note (Signed)
WOC Nurse wound consult note Patient evaluated in [facility] ED 28.  Daughter at bedside.  Assisted with exam by her primary RN, Aundra MilletMegan. 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 Measurement: Approximately 0.4 cm x 0.4 cm Wound bed: pink Drainage (amount, consistency, odor) no drainage, no odor. Periwound: intact, normal color and texture skin Dressing procedure/placement/frequency: Pericare every 4 hours with no rinse spray and Criticaid clear. Monitor the wound area(s) for worsening of condition such as: Signs/symptoms of infection,  Increase in size,  Development of or worsening of odor, Development of pain, or increased pain at the affected locations.  Notify the medical team if any of these develop.  Thank you for the consult.  Discussed plan of care with the patient and bedside nurse.  WOC nurse will not follow at this time.  Please re-consult the WOC team if needed.  Helmut MusterSherry Deontrey Massi, RN, MSN, CWOCN, CNS-BC, pager [phone]

## 2018-11-21 NOTE — Consult Note (Signed)
Consultation Note Date: 11/21/2018   Patient Name: Diana Hartman  DOB: [birth year]  MRN: [id number]  Age / Sex: 77 y.o., female  PCP: [facility] Referring Physician: Lonia Blood, MD  Reason for Consultation: Establishing goals of care  HPI/Patient Profile: 77 y.o. female  with past medical history of diabetes, dementia, hypertension, stroke, temporal arteritis admitted on 11/20/2018 with acute metabolic encephalopathy.  Palliative team consulted for Long term goals of care, still full code per daughter despite what sounds like very poor baseline QoL (bedbound, pureed diet, aphasia, advanced dementia, and possibly even adult failure to thrive   Clinical Assessment and Goals of Care: Diana Hartman is lying quietly in bed.  She appears acutely/chronically ill, and frail.  She does not open her eyes when I call her name or touch her.  She is unable to make her most basic needs known.  There is no family at bedside at this time.    Conversation with nursing staff related to patient needs, health status. Conversation with hospitalist related to patient needs, health status.  Call to daughter, Diana Hartman at [phone].  Generic voicemail message left requesting return phone call for family meeting.  HCPOA  NEXT OF KIN -no healthcare power of attorney paperwork noted in document section of epic chart.  3 children listed under contacts.   SUMMARY OF RECOMMENDATIONS   At this point, full scope, full code Family meeting needed PMT to continue CODE STATUS discussions  Code Status/Advance Care Planning:  Full code -Per ED provider documentation  Symptom Management:   Per hospitalist, no additional needs at this time.  Palliative Prophylaxis:   Palliative Wound Care and Turn Reposition  Additional Recommendations (Limitations, Scope, Preferences):  Full Scope  Treatment  Psycho-social/Spiritual:   Desire for further Chaplaincy support:no  Additional Recommendations: Caregiving  Support/Resources and Education on Hospice  Prognosis:   Unable to determine based on outcomes, and recovery.  Discharge Planning: To be determined, based on outcomes.      Primary Diagnoses: Present on Admission: . (Resolved) Acute encephalopathy . Hyperglycemia without ketosis . Hypernatremia . Acute metabolic encephalopathy . Fecal impaction in rectum (HCC) . Benign essential HTN . Dementia without behavioral disturbance (HCC)   I have reviewed the medical record, interviewed the patient and family, and examined the patient. The following aspects are pertinent.  Past Medical History:  Diagnosis Date  . Dementia (HCC)   . Diabetes mellitus   . Hypertension   . Stroke (HCC)   . Temporal arteritis (HCC)    Social History   Socioeconomic History  . Marital status: Married    Spouse name: Not on file  . Number of children: Not on file  . Years of education: Not on file  . Highest education level: Not on file  Occupational History  . Not on file  Social Needs  . Financial resource strain: Not on file  . Food insecurity:    Worry: Not on file    Inability: Not on file  .  Transportation needs:    Medical: Not on file    Non-medical: Not on file  Tobacco Use  . Smoking status: Never Smoker  . Smokeless tobacco: Never Used  Substance and Sexual Activity  . Alcohol use: No  . Drug use: No  . Sexual activity: Not on file  Lifestyle  . Physical activity:    Days per week: Not on file    Minutes per session: Not on file  . Stress: Not on file  Relationships  . Social connections:    Talks on phone: Not on file    Gets together: Not on file    Attends religious service: Not on file    Active member of club or organization: Not on file    Attends meetings of clubs or organizations: Not on file    Relationship status: Not on file  Other  Topics Concern  . Not on file  Social History Narrative  . Not on file   Family History  Problem Relation Age of Onset  . Diabetes type II Other   . Hypertension Other    Scheduled Meds: . enoxaparin (LOVENOX) injection  30 mg Subcutaneous Q24H  . insulin aspart  0-9 Units Subcutaneous Q4H   Continuous Infusions: . sodium chloride 75 mL/hr at 11/21/18 1432   PRN Meds:. Medications Prior to Admission:  Prior to Admission medications   Medication Sig Start Date End Date Taking? Authorizing Provider  aspirin EC 81 MG EC tablet Take 1 tablet (81 mg total) by mouth daily. 07/05/18  Yes Leatha Gilding, MD  atorvastatin (LIPITOR) 80 MG tablet Take 1 tablet (80 mg total) by mouth daily at 6 PM. 07/04/18  Yes Gherghe, Daylene Katayama, MD  Calcium Carbonate-Vitamin D (CALCIUM 600+D) 600-200 MG-UNIT TABS Take 1 tablet by mouth daily.   Yes [provider]  clopidogrel (PLAVIX) 75 MG tablet Take 1 tablet (75 mg total) by mouth daily. 07/05/18  Yes Gherghe, Daylene Katayama, MD  diltiazem (CARDIZEM) 120 MG tablet Take 120 mg by mouth 2 (two) times daily.    Yes [provider]  donepezil (ARICEPT) 10 MG tablet Take 10 mg by mouth daily.   Yes [provider]  glipiZIDE (GLUCOTROL) 10 MG tablet Take 10 mg by mouth daily. 06/14/18  Yes [provider]  insulin detemir (LEVEMIR) 100 UNIT/ML injection Inject 0.1 mLs (10 Units total) into the skin 2 (two) times daily. 07/06/15  Yes Gherghe, Daylene Katayama, MD  lisinopril (PRINIVIL,ZESTRIL) 40 MG tablet Take 40 mg by mouth daily.     Yes [provider]  Multiple Vitamin (MULTIVITAMIN) tablet Take 1 tablet by mouth daily.   Yes [provider]  nebivolol (BYSTOLIC) 5 MG tablet Take 5 mg by mouth daily.   Yes [provider]  sertraline (ZOLOFT) 50 MG tablet Take 50 mg by mouth at bedtime.    Yes [provider]  zolpidem (AMBIEN) 10 MG tablet Take 10 mg by mouth at bedtime as needed for sleep.  05/31/15   Yes [provider]  GLUCERNA (GLUCERNA) LIQD Take 237 mLs by mouth 2 (two) times daily between meals. Patient taking differently: Take 237 mLs by mouth as needed.  05/19/16   Regalado, Jon Billings A, MD   No Known Allergies Review of Systems  Unable to perform ROS: Dementia    Physical Exam Vitals signs and nursing note reviewed.  Constitutional:      Comments: Does not open eyes to voice or touch, appears frail and thin  HENT:     Head: Atraumatic.     Comments: Some temporal wasting Cardiovascular:     Rate and Rhythm: Normal rate.  Pulmonary:     Effort: Pulmonary effort is normal. No respiratory distress.  Abdominal:     General: Abdomen is flat. There is no distension.     Palpations: Abdomen is soft.  Musculoskeletal:     Comments: Muscle wasting  Skin:    General: Skin is warm and dry.     Comments: Wounds as noted by nursing  Neurological:     Comments: Known dementia, does not open eyes to voice or touch     Vital Signs: BP (!) 140/111   Pulse 93   Temp 97.6 F (36.4 C) (Axillary)   Resp 16   SpO2 97%  Pain Scale: Faces   Pain Score: Asleep   SpO2: SpO2: 97 % O2 Device:SpO2: 97 % O2 Flow Rate: .   IO: Intake/output summary:   Intake/Output Summary (Last 24 hours) at 11/21/2018 1516 Last data filed at 11/21/2018 0526 Gross per 24 hour  Intake 100 ml  Output -  Net 100 ml    LBM:   Baseline Weight:   Most recent weight:       Palliative Assessment/Data:   Flowsheet Rows     Most Recent Value  Intake Tab  Referral Department  Hospitalist  Unit at Time of Referral  ER  Palliative Care Primary Diagnosis  Neurology  Date Notified  11/21/18  Palliative Care Type  New Palliative care  Reason for referral  Clarify Goals of Care  Date of Admission  11/21/18  Date first seen by Palliative Care  11/21/18  # of days Palliative referral response time  0 Day(s)  # of days IP prior to Palliative referral  0  Clinical Assessment  Palliative  Performance Scale Score  20%  Pain Max last 24 hours  Not able to report  Pain Min Last 24 hours  Not able to report  Dyspnea Max Last 24 Hours  Not able to report  Dyspnea Min Last 24 hours  Not able to report  Psychosocial & Spiritual Assessment  Palliative Care Outcomes  Patient/Family meeting held?  Yes      Time In: 1310 Time Out: 1345 Time Total: 35 minutes Greater than 50%  of this time was spent counseling and coordinating care related to the above assessment and plan.  Signed by: Katheran Awe, NP   Please contact Palliative Medicine Team phone at [phone] for questions and concerns.  For individual provider: See Loretha Stapler

## 2018-11-21 NOTE — ED Notes (Signed)
Per lab pt's urine sample is too viscous to run a reliable UA

## 2018-11-21 NOTE — Progress Notes (Signed)
[facility] TEAM 1 - Stepdown/ICU TEAM  OSHA BORUTA  ZOX:[id number] DOB: 1941-05-12 DOA: 11/20/2018 PCP: [name]    Brief Narrative:  77 y.o. female w/ a hx of Dementia, stroke with R sided deficits, DM2, HTN, and temporal arteritis many years ago who presented from her SNF with decreased LOC. Daughter reported decreased PO intake over a couple of days.    In the ED she was found to have a glucose of 936, sodium 151, Creat 1.4 up from 0.7 baseline. CT head noted nothing acute. CT abd/pelvis noted fecal impaction in rectum but no stercoral colitis.  Significant Events: 12/16 admit  Subjective: Pt is seen for a f/u visit.    Assessment & Plan:  Acute metabolic encephalopathy  HONK  Hypernatremia  AKI   Dementia - chronic  Stool impaction  Milk and Molasses enema ordered x1  HTN   Transaminitis   DVT prophylaxis: lovenox  Code Status: FULL CODE Family Communication:  Disposition Plan:   Consultants:  Palliative Care   Antimicrobials:  none   Objective: Blood pressure (!) 145/65, pulse 90, temperature 97.6 F (36.4 C), temperature source Axillary, resp. rate (!) 21, SpO2 99 %.  Intake/Output Summary (Last 24 hours) at 11/21/2018 1802 Last data filed at 11/21/2018 0526 Gross per 24 hour  Intake 100 ml  Output -  Net 100 ml   There were no vitals filed for this visit.  Examination: Pt was seen for a f/u visit.    CBC: Recent Labs  Lab 11/20/18 1909  WBC 10.5  HGB 11.7*  HCT 39.4  MCV 95.9  PLT 146*   Basic Metabolic Panel: Recent Labs  Lab 11/20/18 1909 11/21/18 0123 11/21/18 0557 11/21/18 1025 11/21/18 1327  NA 151* 158* 158* 155* 154*  K 3.1* 3.2* 3.5 3.3* 3.2*  CL 109 121* 122* 121* 120*  CO2 29 26 28 29 28   GLUCOSE 936* 250* 110* 184* 191*  BUN 30* 24* 21 21 20   CREATININE 1.46* 1.12* 0.98 1.12* 1.00  CALCIUM 8.6* 8.5* 8.6* 8.6* 8.5*   GFR: CrCl cannot be calculated (Unknown ideal weight.).  Liver  Function Tests: Recent Labs  Lab 11/20/18 1909 11/21/18 0557  AST 73* 67*  ALT 74* 62*  ALKPHOS 269* 225*  BILITOT 0.9 0.7  PROT 8.7* 8.0  ALBUMIN 2.3* 2.1*   HbA1C: Hgb A1c MFr Bld  Date/Time Value Ref Range Status  06/28/2018 10:57 AM 6.3 (H) 4.8 - 5.6 % Final    Comment:    (NOTE) Pre diabetes:          5.7%-6.4% Diabetes:              >6.4% Glycemic control for   <7.0% adults with diabetes   07/04/2015 06:09 PM 6.8 (H) 4.8 - 5.6 % Final    Comment:    (NOTE)         Pre-diabetes: 5.7 - 6.4         Diabetes: >6.4         Glycemic control for adults with diabetes: <7.0     CBG: Recent Labs  Lab 11/21/18 0400 11/21/18 0717 11/21/18 1147 11/21/18 1543 11/21/18 1658  GLUCAP 118* 135* 171* 165* 147*    Scheduled Meds: . enoxaparin (LOVENOX) injection  30 mg Subcutaneous Q24H  . insulin aspart  0-9 Units Subcutaneous Q4H   Continuous Infusions: . sodium chloride 75 mL/hr at 11/21/18 1432     LOS: 0 days   Time spent: No Charge  Lonia Blood, MD [facility] Office  [phone] Pager - Text Page per Amion as per below:  On-Call/Text Page:      [email or web address]  If 7PM-7AM, please contact night-coverage [email or web address] 11/21/2018, 6:02 PM

## 2018-11-21 NOTE — Progress Notes (Signed)
BGL improved, will stop D5 half and insulin gtt.  Switch to sensitive SSI Q4H and half NS (without D5).  Continue Q4H BMPs to monitor sodium.  Mental status is definitely improved, patient now awake, looking around room.

## 2018-11-21 NOTE — ED Notes (Signed)
Went to help EMT Gerilyn PilgrimJacob with in and out cath, found patient triple briefed.  Patient brief was full of urine and feces, also found some redness on patients genitalia.

## 2018-11-21 NOTE — ED Notes (Signed)
Pa Layden informed of venous blood gas and lactic acid results 5.09

## 2018-11-21 NOTE — H&P (Signed)
History and Physical    Diana Hartman XBJ:[id number] DOB: [birth year] DOA: 11/20/2018  PCP: [facility]  Patient coming from: SNF  I have personally briefly reviewed patient's old medical records in [place]  Chief Complaint: AMS  HPI: Diana Hartman is a 77 y.o. female with medical history significant of Dementia, stroke with R sided deficits, DM2, HTN, temporal arteritis many years ago.  Patient presents to the ED from SNF with report of decreased LOC and interaction throughout the day today.  Daughter reports decreased PO intake over last couple of days.  Reports that patient does have a baseline history of dementia but states she is usually alert and will respond to questions. They state her blood sugars have been slightly elevated and reports that yesterday they are in the 400s. Patient has not been complaining of any pain and has not any vomiting or fever.   ED Course: BGL 936, sodium 151 (uncorrected).  Creat 1.4 up from 0.7 baseline, K 3.1.  ALK 269, AST/ALT 73/74.  CTH nothing acute CT abd/pelvis - fecal impaction in rectum but no stercoral colitis   Review of Systems: Unable to perform due to AMS  Past Medical History:  Diagnosis Date  . Dementia ([place])   . Diabetes mellitus   . Hypertension   . Stroke ([place])   . Temporal arteritis [facility])     Past Surgical History:  Procedure Laterality Date  . NO PAST SURGERIES       reports that she has never smoked. She has never used smokeless tobacco. She reports that she does not drink alcohol or use drugs.  No Known Allergies  Family History  Problem Relation Age of Onset  . Diabetes type II Other   . Hypertension Other      Prior to Admission medications   Medication Sig Start Date End Date Taking? Authorizing Provider  aspirin EC 81 MG EC tablet Take 1 tablet (81 mg total) by mouth daily. 07/05/18  Yes Caren Griffins, MD  atorvastatin (LIPITOR) 80 MG tablet Take 1 tablet (80 mg  total) by mouth daily at 6 PM. 07/04/18  Yes Gherghe, Vella Redhead, MD  Calcium Carbonate-Vitamin D (CALCIUM 600+D) 600-200 MG-UNIT TABS Take 1 tablet by mouth daily.   Yes [provider]  clopidogrel (PLAVIX) 75 MG tablet Take 1 tablet (75 mg total) by mouth daily. 07/05/18  Yes Gherghe, Vella Redhead, MD  diltiazem (CARDIZEM) 120 MG tablet Take 120 mg by mouth 2 (two) times daily.    Yes [provider]  donepezil (ARICEPT) 10 MG tablet Take 10 mg by mouth daily.   Yes [provider]  glipiZIDE (GLUCOTROL) 10 MG tablet Take 10 mg by mouth daily. 06/14/18  Yes [provider]  insulin detemir (LEVEMIR) 100 UNIT/ML injection Inject 0.1 mLs (10 Units total) into the skin 2 (two) times daily. 07/06/15  Yes Gherghe, Vella Redhead, MD  lisinopril (PRINIVIL,ZESTRIL) 40 MG tablet Take 40 mg by mouth daily.     Yes [provider]  Multiple Vitamin (MULTIVITAMIN) tablet Take 1 tablet by mouth daily.   Yes [provider]  nebivolol (BYSTOLIC) 5 MG tablet Take 5 mg by mouth daily.   Yes [provider]  sertraline (ZOLOFT) 50 MG tablet Take 50 mg by mouth at bedtime.    Yes [provider]  zolpidem (AMBIEN) 10 MG tablet Take 10 mg by mouth at bedtime as needed for sleep.  05/31/15  Yes [provider]  GLUCERNA (GLUCERNA) LIQD Take 237 mLs by mouth 2 (two) times daily between meals. Patient taking differently: Take 237 mLs by mouth as needed.  05/19/16   Elmarie Shiley, MD    Physical Exam: Vitals:   11/21/18 0045 11/21/18 0100 11/21/18 0115 11/21/18 0130  BP: 115/67 (!) 117/59 (!) 117/57 124/78  Pulse: (!) 29     Resp: 19 (!) 23 (!) 22 17  Temp:      TempSrc:      SpO2: 93%       Constitutional: Frail, emaciated, elderly Eyes: PERRL, lids and conjunctivae normal ENMT: Mucous membranes are moist. Posterior pharynx clear of any exudate or lesions.Normal dentition.  Neck: normal, supple, no masses, no thyromegaly Respiratory:  clear to auscultation bilaterally, no wheezing, no crackles. Normal respiratory effort. No accessory muscle use.  Cardiovascular: Regular rate and rhythm, no murmurs / rubs / gallops. No extremity edema. 2+ pedal pulses. No carotid bruits.  Abdomen: generalized TTP no masses palpated. No hepatosplenomegaly. Bowel sounds positive.  Musculoskeletal: no clubbing / cyanosis. No joint deformity upper and lower extremities. Good ROM, no contractures. Normal muscle tone.  Skin: no rashes, lesions, ulcers. No induration Neurologic: Non-verbal, opening eyes spontaneously / to voice now (as opposed to pain previously), not participating in exam Psychiatric: Non verbal, opening eyes   Labs on Admission: I have personally reviewed following labs and imaging studies  CBC: Recent Labs  Lab 11/20/18 1909  WBC 10.5  HGB 11.7*  HCT 39.4  MCV 95.9  PLT 527*   Basic Metabolic Panel: Recent Labs  Lab 11/20/18 1909  NA 151*  K 3.1*  CL 109  CO2 29  GLUCOSE 936*  BUN 30*  CREATININE 1.46*  CALCIUM 8.6*   GFR: CrCl cannot be calculated (Unknown ideal weight.). Liver Function Tests: Recent Labs  Lab 11/20/18 1909  AST 73*  ALT 74*  ALKPHOS 269*  BILITOT 0.9  PROT 8.7*  ALBUMIN 2.3*   No results for input(s): LIPASE, AMYLASE in the last 168 hours. No results for input(s): AMMONIA in the last 168 hours. Coagulation Profile: No results for input(s): INR, PROTIME in the last 168 hours. Cardiac Enzymes: No results for input(s): CKTOTAL, CKMB, CKMBINDEX, TROPONINI in the last 168 hours. BNP (last 3 results) No results for input(s): PROBNP in the last 8760 hours. HbA1C: No results for input(s): HGBA1C in the last 72 hours. CBG: Recent Labs  Lab 11/20/18 1914 11/20/18 2142 11/20/18 2308 11/21/18 0032 11/21/18 0137  GLUCAP >600* >600* 412* 260* 189*   Lipid Profile: No results for input(s): CHOL, HDL, LDLCALC, TRIG, CHOLHDL, LDLDIRECT in the last 72 hours. Thyroid Function  Tests: No results for input(s): TSH, T4TOTAL, FREET4, T3FREE, THYROIDAB in the last 72 hours. Anemia Panel: No results for input(s): VITAMINB12, FOLATE, FERRITIN, TIBC, IRON, RETICCTPCT in the last 72 hours. Urine analysis:    Component Value Date/Time   COLORURINE AMBER (A) 06/29/2018 1017   APPEARANCEUR TURBID (A) 06/29/2018 1017   LABSPEC 1.003 (L) 06/29/2018 1017   PHURINE 7.0 06/29/2018 1017   GLUCOSEU 50 (A) 06/29/2018 1017   HGBUR MODERATE (A) 06/29/2018 1017   BILIRUBINUR NEGATIVE 06/29/2018 1017   KETONESUR NEGATIVE 06/29/2018 1017   PROTEINUR 100 (A) 06/29/2018 1017   UROBILINOGEN 1.0 07/04/2015 1220   NITRITE NEGATIVE 06/29/2018 1017   LEUKOCYTESUR LARGE (A) 06/29/2018 1017    Radiological Exams on Admission: Ct Head Wo Contrast  Result Date: 11/20/2018 CLINICAL DATA:  Altered mental status.  Hyperglycemic. EXAM: CT HEAD WITHOUT  CONTRAST TECHNIQUE: Contiguous axial images were obtained from the base of the skull through the vertex without intravenous contrast. COMPARISON:  MRI of the head June 28, 2018 FINDINGS: BRAIN: No intraparenchymal hemorrhage, mass effect nor midline shift. Severe parenchymal brain volume loss most notable within mesial occipital lobes. No hydrocephalus. Old LEFT cerebellar infarct. Bifrontal encephalomalacia. Patchy supratentorial white matter hypodensities less than expected for patient's age, though non-specific are most compatible with chronic small vessel ischemic disease. No acute large vascular territory infarcts. No abnormal extra-axial fluid collections. Basal cisterns are patent. VASCULAR: Moderate calcific atherosclerosis of the carotid siphons. SKULL: No skull fracture. Borderline empty sella. No significant scalp soft tissue swelling. SINUSES/ORBITS: Trace paranasal sinus mucosal thickening. Mastoid air cells are well aerated.The included ocular globes and orbital contents are non-suspicious. Status post bilateral ocular lens implants. OTHER:  None. IMPRESSION: 1. No acute intracranial process. 2. Severe parenchymal brain volume loss most notable within mesial temporal lobes associated with neurodegenerative syndromes. 3. Old bifrontal/MCA territory infarcts. Old small LEFT cerebellar infarct. Electronically Signed   By: Elon Alas M.D.   On: 11/20/2018 22:57   Ct Abdomen Pelvis W Contrast  Result Date: 11/20/2018 CLINICAL DATA:  Abdominal pain. EXAM: CT ABDOMEN AND PELVIS WITH CONTRAST TECHNIQUE: Multidetector CT imaging of the abdomen and pelvis was performed using the standard protocol following bolus administration of intravenous contrast. CONTRAST:  162m OMNIPAQUE IOHEXOL 300 MG/ML  SOLN COMPARISON:  None. FINDINGS: Lower chest: No acute abnormality. Subsegmental atelectasis in the right lower lobe. Hepatobiliary: No focal liver abnormality is seen. No gallstones, gallbladder wall thickening, or biliary dilatation. Pancreas: Unremarkable. No pancreatic ductal dilatation or surrounding inflammatory changes. Spleen: Normal in size without focal abnormality. Adrenals/Urinary Tract: The adrenal glands are unremarkable. Small bilateral renal cysts. Other subcentimeter low-density lesions in both kidneys are too small to characterize. No renal calculi or hydronephrosis. Mild circumferential bladder wall thickening. Stomach/Bowel: Stomach is within normal limits. Appendix appears normal. No evidence of bowel wall thickening, distention, or inflammatory changes. Large stool ball in the rectum. Vascular/Lymphatic: Aortic atherosclerosis. No enlarged abdominal or pelvic lymph nodes. Reproductive: Small amount of fluid and air within the endometrial canal. No adnexal mass. Other: Right paraumbilical hernia containing fat and non-dilated transverse colon. No free fluid or pneumoperitoneum. Musculoskeletal: No acute or significant osseous findings. Severe degenerative disc disease of the lower lumbar spine with grade 1 anterolisthesis at L3-L4.  IMPRESSION: 1. Large stool ball in the rectum. Correlate for fecal impaction. No significant surrounding inflammatory changes to suggest stercoral colitis. 2. Mild circumferential bladder wall thickening. Correlate with urinalysis to exclude cystitis. 3. Right paraumbilical hernia containing fat and non-dilated transverse colon. 4. Small amount of fluid and air within the endometrial canal. Correlate with any history of recent pelvic exam and for signs or symptoms of endometritis. 5.  Aortic atherosclerosis (ICD10-I70.0). Electronically Signed   By: WTitus DubinM.D.   On: 11/20/2018 23:12    EKG: Independently reviewed.  Assessment/Plan Principal Problem:   Acute metabolic encephalopathy Active Problems:   Dementia without behavioral disturbance (HCC)   Hypernatremia   Hyperglycemia without ketosis   Diabetes mellitus type 2 in nonobese (HCC)   Benign essential HTN   [place]    1. Acute metabolic encephalopathy - 1. HONC + Hypernatremia 2. Hyperglycemia, hypernatremia, AKI - 1. Insulin gtt per DKA pathway 2. Q4H BMPs 3. 6 runs IV K for initial K of 3.1 4. 2L NS bolus and 125 cc/hr NS initially 5. Followed by  D5 half at 75 cc/hr (corrected sodium already down 9 on first repeat BMP) 3. Dementia - chronic 1. talks to family at baseline but not other people apparently 2. chronic hemiparesis since stroke, doesn't walk anymore 3. Pureed diet at baseline, thin liquids 4. Stool impaction - 1. Milk and Molasses enema ordered x1 5. HTN - 1. Holding home PO meds due to encephalopathy 6. Transaminitis - repeat LFTs later this AM  DVT prophylaxis: Lovenox Code Status: Full code still per daughter... Family Communication: Daughter at bedside Disposition Plan: SNF after admit Consults called: Pal care consult put into Epic Admission status: Admit to inpatient  Severity of Illness: The appropriate patient status for this patient is INPATIENT. Inpatient status is  judged to be reasonable and necessary in order to provide the required intensity of service to ensure the patient's safety. The patient's presenting symptoms, physical exam findings, and initial radiographic and laboratory data in the context of their chronic comorbidities is felt to place them at high risk for further clinical deterioration. Furthermore, it is not anticipated that the patient will be medically stable for discharge from the hospital within 2 midnights of admission. The following factors support the patient status of inpatient.   " The patient's presenting symptoms include AMS. " The worrisome physical exam findings include AMS. " The initial radiographic and laboratory data are worrisome because of BGL 936, sodium 151 (corrected 171), AKI. " The chronic co-morbidities include Dementia, DM2, aphasia and dysphagia following stroke, bed bound.   * I certify that at the point of admission it is my clinical judgment that the patient will require inpatient hospital care spanning beyond 2 midnights from the point of admission due to high intensity of service, high risk for further deterioration and high frequency of surveillance required.Etta Quill DO [facility] Pager [phone] Only works nights!  If 7AM-7PM, please contact the primary day team physician taking care of patient  [email or web address] Password [email or web address]  11/21/2018, 1:39 AM

## 2018-11-21 NOTE — Progress Notes (Signed)
Inpatient Diabetes Program Recommendations  AACE/[place]: New Consensus Statement on Inpatient Glycemic Control (2015)  Target Ranges:  Prepandial:   less than 140 mg/dL      Peak postprandial:   less than 180 mg/dL (1-2 hours)      Critically ill patients:  140 - 180 mg/dL   Results for Diana Hartman, Diana Hartman (MRN [id number]) as of 11/21/2018 10:43  Ref. Range 11/20/2018 19:14 11/20/2018 21:42 11/20/2018 23:08 11/21/2018 00:32 11/21/2018 01:37 11/21/2018 02:51 11/21/2018 04:00 11/21/2018 07:17  Glucose-Capillary Latest Ref Range: 70 - 99 mg/dL >409>600 (HH)   >811>600 (HH)  IV Insulin Drip 412 (H)  IV Insulin Drip 260 (H)  IV Insulin Drip 189 (H)  IV Insulin Drip 144 (H)  IV Insulin Drip 118 (H) 135 (H)  1 unit NOVOLOG      Admit with: AMS/ Hyperglycemia (glucose 936 mg/dl on admit)  History: DM, Dementia, CVA  SNF DM Meds: Levemir 10 units BID     Glipizide 10 mg Daily  Current Orders: Novolog Sensitive Correction Scale/ SSI (0-9 units) Q4 hours      Treated with IVF and started on IV Insulin drip at 8:30pm last night.  IV Insulin drip off at 3am today.  Novolog SSI started at 7am today.    --Will follow patient during hospitalization--  Ambrose FinlandJeannine Johnston Merilee Wible RN, MSN, CDE Diabetes Coordinator Inpatient Glycemic Control Team Team Pager: [phone] (8a-5p)

## 2018-11-21 NOTE — ED Notes (Signed)
Milk and molasses enema given per order.  Large amount of soft brown stool noted out.  Tolerated well.

## 2018-11-21 NOTE — ED Notes (Signed)
Puree diet ordered for patient.

## 2018-11-22 ENCOUNTER — Encounter (HOSPITAL_COMMUNITY): Payer: Self-pay | Admitting: Primary Care

## 2018-11-22 ENCOUNTER — Ambulatory Visit: Payer: Medicare Other | Admitting: Adult Health

## 2018-11-22 DIAGNOSIS — Z7189 Other specified counseling: Secondary | ICD-10-CM

## 2018-11-22 LAB — CBC
HCT: 32.8 % — ABNORMAL LOW (ref 36.0–46.0)
HEMOGLOBIN: 10.2 g/dL — AB (ref 12.0–15.0)
MCH: 28.7 pg (ref 26.0–34.0)
MCHC: 31.1 g/dL (ref 30.0–36.0)
MCV: 92.1 fL (ref 80.0–100.0)
Platelets: 100 10*3/uL — ABNORMAL LOW (ref 150–400)
RBC: 3.56 MIL/uL — ABNORMAL LOW (ref 3.87–5.11)
RDW: 16.5 % — ABNORMAL HIGH (ref 11.5–15.5)
WBC: 10 10*3/uL (ref 4.0–10.5)
nRBC: 0 % (ref 0.0–0.2)

## 2018-11-22 LAB — GLUCOSE, CAPILLARY
GLUCOSE-CAPILLARY: 320 mg/dL — AB (ref 70–99)
Glucose-Capillary: 110 mg/dL — ABNORMAL HIGH (ref 70–99)
Glucose-Capillary: 230 mg/dL — ABNORMAL HIGH (ref 70–99)
Glucose-Capillary: 254 mg/dL — ABNORMAL HIGH (ref 70–99)
Glucose-Capillary: 297 mg/dL — ABNORMAL HIGH (ref 70–99)
Glucose-Capillary: 339 mg/dL — ABNORMAL HIGH (ref 70–99)
Glucose-Capillary: 361 mg/dL — ABNORMAL HIGH (ref 70–99)

## 2018-11-22 LAB — COMPREHENSIVE METABOLIC PANEL
ALT: 65 U/L — ABNORMAL HIGH (ref 0–44)
AST: 113 U/L — ABNORMAL HIGH (ref 15–41)
Albumin: 2.1 g/dL — ABNORMAL LOW (ref 3.5–5.0)
Alkaline Phosphatase: 208 U/L — ABNORMAL HIGH (ref 38–126)
Anion gap: 8 (ref 5–15)
BUN: 19 mg/dL (ref 8–23)
CO2: 28 mmol/L (ref 22–32)
Calcium: 8.5 mg/dL — ABNORMAL LOW (ref 8.9–10.3)
Chloride: 117 mmol/L — ABNORMAL HIGH (ref 98–111)
Creatinine, Ser: 0.96 mg/dL (ref 0.44–1.00)
GFR calc Af Amer: >60 mL/min (ref >60)
GFR calc non Af Amer: 56 mL/min — ABNORMAL LOW (ref >60)
Glucose, Bld: 290 mg/dL — ABNORMAL HIGH (ref 70–99)
POTASSIUM: 3.1 mmol/L — AB (ref 3.5–5.1)
Sodium: 153 mmol/L — ABNORMAL HIGH (ref 135–145)
Total Bilirubin: 0.8 mg/dL (ref 0.3–1.2)
Total Protein: 7.5 g/dL (ref 6.5–8.1)

## 2018-11-22 LAB — FOLATE: Folate: 22.7 ng/mL (ref >5.9)

## 2018-11-22 LAB — VITAMIN B12: Vitamin B-12: 1581 pg/mL — ABNORMAL HIGH (ref 180–914)

## 2018-11-22 LAB — AMMONIA: AMMONIA: 73 umol/L — AB (ref 9–35)

## 2018-11-22 LAB — MAGNESIUM: Magnesium: 2.2 mg/dL (ref 1.7–2.4)

## 2018-11-22 LAB — PHOSPHORUS: PHOSPHORUS: 1.3 mg/dL — AB (ref 2.5–4.6)

## 2018-11-22 MED ORDER — POTASSIUM & SODIUM PHOSPHATES 280-160-250 MG PO PACK
2.0000 | PACK | Freq: Three times a day (TID) | ORAL | Status: DC
  Administered 2018-11-22 – 2018-11-27 (×20): 2 via ORAL
  Filled 2018-11-22 (×23): qty 2

## 2018-11-22 MED ORDER — ATORVASTATIN CALCIUM 80 MG PO TABS
80.0000 mg | ORAL_TABLET | Freq: Every day | ORAL | Status: DC
  Administered 2018-11-22 – 2018-11-26 (×5): 80 mg via ORAL
  Filled 2018-11-22 (×6): qty 1

## 2018-11-22 MED ORDER — K PHOS MONO-SOD PHOS DI & MONO 155-852-130 MG PO TABS
500.0000 mg | ORAL_TABLET | Freq: Three times a day (TID) | ORAL | Status: DC
  Filled 2018-11-22 (×3): qty 2

## 2018-11-22 MED ORDER — SERTRALINE HCL 50 MG PO TABS
50.0000 mg | ORAL_TABLET | Freq: Every day | ORAL | Status: DC
  Administered 2018-11-22 – 2018-11-26 (×5): 50 mg via ORAL
  Filled 2018-11-22 (×5): qty 1

## 2018-11-22 MED ORDER — INSULIN ASPART 100 UNIT/ML ~~LOC~~ SOLN
0.0000 [IU] | Freq: Three times a day (TID) | SUBCUTANEOUS | Status: DC
  Administered 2018-11-22: 7 [IU] via SUBCUTANEOUS
  Administered 2018-11-22: 9 [IU] via SUBCUTANEOUS
  Administered 2018-11-23: 2 [IU] via SUBCUTANEOUS
  Administered 2018-11-23: 9 [IU] via SUBCUTANEOUS
  Administered 2018-11-23: 7 [IU] via SUBCUTANEOUS
  Administered 2018-11-24: 9 [IU] via SUBCUTANEOUS
  Administered 2018-11-24 (×2): 3 [IU] via SUBCUTANEOUS
  Administered 2018-11-25: 5 [IU] via SUBCUTANEOUS
  Administered 2018-11-26: 7 [IU] via SUBCUTANEOUS
  Administered 2018-11-26: 2 [IU] via SUBCUTANEOUS
  Administered 2018-11-26 – 2018-11-27 (×2): 1 [IU] via SUBCUTANEOUS

## 2018-11-22 MED ORDER — INSULIN ASPART 100 UNIT/ML ~~LOC~~ SOLN
0.0000 [IU] | Freq: Every day | SUBCUTANEOUS | Status: DC
  Administered 2018-11-22: 3 [IU] via SUBCUTANEOUS
  Administered 2018-11-23 – 2018-11-24 (×2): 4 [IU] via SUBCUTANEOUS
  Administered 2018-11-25: 3 [IU] via SUBCUTANEOUS

## 2018-11-22 MED ORDER — POTASSIUM CHLORIDE CRYS ER 20 MEQ PO TBCR
40.0000 meq | EXTENDED_RELEASE_TABLET | Freq: Two times a day (BID) | ORAL | Status: AC
  Administered 2018-11-22 (×2): 40 meq via ORAL
  Filled 2018-11-22 (×2): qty 2

## 2018-11-22 MED ORDER — ASPIRIN EC 81 MG PO TBEC
81.0000 mg | DELAYED_RELEASE_TABLET | Freq: Every day | ORAL | Status: DC
  Administered 2018-11-22 – 2018-11-27 (×6): 81 mg via ORAL
  Filled 2018-11-22 (×6): qty 1

## 2018-11-22 MED ORDER — INSULIN DETEMIR 100 UNIT/ML ~~LOC~~ SOLN
5.0000 [IU] | Freq: Two times a day (BID) | SUBCUTANEOUS | Status: DC
  Administered 2018-11-22 – 2018-11-23 (×3): 5 [IU] via SUBCUTANEOUS
  Filled 2018-11-22 (×4): qty 0.05

## 2018-11-22 MED ORDER — LACTULOSE 10 GM/15ML PO SOLN
10.0000 g | Freq: Two times a day (BID) | ORAL | Status: DC
  Administered 2018-11-22 – 2018-11-27 (×11): 10 g via ORAL
  Filled 2018-11-22 (×11): qty 15

## 2018-11-22 MED ORDER — POTASSIUM PHOSPHATE MONOBASIC 500 MG PO TABS
500.0000 mg | ORAL_TABLET | Freq: Three times a day (TID) | ORAL | Status: DC
  Filled 2018-11-22: qty 1

## 2018-11-22 MED ORDER — NEBIVOLOL HCL 5 MG PO TABS
5.0000 mg | ORAL_TABLET | Freq: Every day | ORAL | Status: DC
  Administered 2018-11-22 – 2018-11-27 (×6): 5 mg via ORAL
  Filled 2018-11-22 (×7): qty 1

## 2018-11-22 MED ORDER — DONEPEZIL HCL 10 MG PO TABS
10.0000 mg | ORAL_TABLET | Freq: Every day | ORAL | Status: DC
  Administered 2018-11-22 – 2018-11-27 (×6): 10 mg via ORAL
  Filled 2018-11-22 (×6): qty 1

## 2018-11-22 MED ORDER — CLOPIDOGREL BISULFATE 75 MG PO TABS
75.0000 mg | ORAL_TABLET | Freq: Every day | ORAL | Status: DC
  Administered 2018-11-22 – 2018-11-27 (×6): 75 mg via ORAL
  Filled 2018-11-22 (×6): qty 1

## 2018-11-22 NOTE — Progress Notes (Signed)
TEAM 1 - Stepdown/ICU TEAM  Diana Hartman  ZOX:[id number] DOB: 06-21-1941 DOA: 11/20/2018 [facility]    Brief Narrative:  77 y.o. female w/ a hx of Dementia, stroke with R sided deficits, DM2, HTN, and temporal arteritis many years ago who presented from her SNF with decreased LOC. Daughter reported decreased PO intake over a couple of days.    In the ED she was found to have a glucose of 936, sodium 151, Creat 1.4 up from 0.7 baseline. CT head noted nothing acute. CT abd/pelvis noted fecal impaction in rectum but no stercoral colitis.  Significant Events: 12/16 admit  Subjective: Pt does not appear uncomfortable this morning, but remains noncommunicative. Her eyes are open, and she is interacting w/ her daughter. She is tolerating breakfast w/o trouble.   Assessment & Plan:  Acute metabolic encephalopathy Elevated ammonia of unclear significance - B12 and folate not low - in setting of prior fecal impaction will start low dose lactulose and follow - encaphalopathy likely due to HONK and subsequent DH  HONK CBGs variable but within a reasonable range - cont SSI - liberalize diet to improve intake and follow for insulin needs   Hypernatremia Slowly improving w/ volume expansion - cont free water and follow trend  Hypophosphatemia   Supplement orally and follow  Hypokalemia Mg is normal - supplement and follow   AKI crt has improved w/ simple volume expansion   Dementia - chronic  Stool impaction  Milk and Molasses enema ordered x1 - appears to have resolve in ED   HTN  BP reasonably controlled and stable   Transaminitis  Slightly increased today - follow   DVT prophylaxis: lovenox  Code Status: FULL CODE Family Communication: spoke w/ daughter at bedside  Disposition Plan: stable for tele bed   Consultants:  Palliative Care   Antimicrobials:  none   Objective: Blood pressure (!) 142/71, pulse 86, temperature 98.5 F  (36.9 C), temperature source Oral, resp. rate 15, SpO2 100 %.  Intake/Output Summary (Last 24 hours) at 11/22/2018 0957 Last data filed at 11/21/2018 1800 Gross per 24 hour  Intake 797.07 ml  Output -  Net 797.07 ml   There were no vitals filed for this visit.  Examination: General: No acute respiratory distress Lungs: Clear to auscultation bilaterally without wheezes or crackles Cardiovascular: Regular rate and rhythm without murmur gallop or rub normal S1 and S2 Abdomen: Nontender, nondistended, soft, bowel sounds positive, no rebound, no ascites, no appreciable mass Extremities: No significant cyanosis, clubbing, or edema bilateral lower extremities   CBC: Recent Labs  Lab 11/20/18 1909 11/22/18 0349  WBC 10.5 10.0  HGB 11.7* 10.2*  HCT 39.4 32.8*  MCV 95.9 92.1  PLT 146* 100*   Basic Metabolic Panel: Recent Labs  Lab 11/21/18 0557 11/21/18 1025 11/21/18 1327 11/21/18 1653 11/22/18 0349  NA 158* 155* 154* 155* 153*  K 3.5 3.3* 3.2* 3.1* 3.1*  CL 122* 121* 120* 119* 117*  CO2 28 29 28 27 28   GLUCOSE 110* 184* 191* 168* 290*  BUN 21 21 20 19 19   CREATININE 0.98 1.12* 1.00 0.99 0.96  CALCIUM 8.6* 8.6* 8.5* 8.6* 8.5*  MG  --   --   --   --  2.2  PHOS  --   --   --   --  1.3*   GFR: CrCl cannot be calculated (Unknown ideal weight.).  Liver Function Tests: Recent Labs  Lab 11/20/18 1909 11/21/18 0557 11/22/18 0349  AST 73* 67* 113*  ALT 74* 62* 65*  ALKPHOS 269* 225* 208*  BILITOT 0.9 0.7 0.8  PROT 8.7* 8.0 7.5  ALBUMIN 2.3* 2.1* 2.1*   HbA1C: Hgb A1c MFr Bld  Date/Time Value Ref Range Status  06/28/2018 10:57 AM 6.3 (H) 4.8 - 5.6 % Final    Comment:    (NOTE) Pre diabetes:          5.7%-6.4% Diabetes:              >6.4% Glycemic control for   <7.0% adults with diabetes   07/04/2015 06:09 PM 6.8 (H) 4.8 - 5.6 % Final    Comment:    (NOTE)         Pre-diabetes: 5.7 - 6.4         Diabetes: >6.4         Glycemic control for adults with  diabetes: <7.0     CBG: Recent Labs  Lab 11/21/18 1658 11/21/18 2006 11/22/18 0005 11/22/18 0426 11/22/18 0823  GLUCAP 147* 275* 339* 230* 110*    Scheduled Meds: . enoxaparin (LOVENOX) injection  30 mg Subcutaneous Q24H  . insulin aspart  0-9 Units Subcutaneous Q4H   Continuous Infusions: . sodium chloride 75 mL/hr at 11/21/18 1857     LOS: 1 day    Lonia Blood, MD [facility] Office  [phone] Pager - Text Page per Loretha Stapler as per below:  On-Call/Text Page:      [email or web address]  If 7PM-7AM, please contact night-coverage [email or web address] 11/22/2018, 9:57 AM

## 2018-11-22 NOTE — Progress Notes (Signed)
Inpatient Diabetes Program Recommendations  AACE/[place]: New Consensus Statement on Inpatient Glycemic Control (2015)  Target Ranges:  Prepandial:   less than 140 mg/dL      Peak postprandial:   less than 180 mg/dL (1-2 hours)      Critically ill patients:  140 - 180 mg/dL   Results for Jamelle HaringHENDERSON, Chisom B (MRN [id number]) as of 11/22/2018 10:04  Ref. Range 11/21/2018 00:32 11/21/2018 01:37 11/21/2018 02:51 11/21/2018 04:00 11/21/2018 07:17 11/21/2018 11:47 11/21/2018 15:43 11/21/2018 16:58 11/21/2018 20:06  Glucose-Capillary Latest Ref Range: 70 - 99 mg/dL 119260 (H) 147189 (H) 829144 (H)  IV Insulin Drip OFF 118 (H) 135 (H)  1 unit NOVOLOG  171 (H)  2 units NOVOLOG  165 (H) 147 (H)  2 units NOVOLOG  275 (H)  5 units NOVOLOG    Results for Jamelle HaringHENDERSON, Anshu B (MRN [id number]) as of 11/22/2018 10:04  Ref. Range 11/22/2018 00:05 11/22/2018 04:26 11/22/2018 08:23  Glucose-Capillary Latest Ref Range: 70 - 99 mg/dL 784339 (H)  7 units NOVOLOG  230 (H)  3 units NOVOLOG  110 (H)    Admit with: AMS/ Hyperglycemia (glucose 936 mg/dl on admit)  History: DM, Dementia, CVA  SNF DM Meds: Levemir 10 units BID                           Glipizide 10 mg Daily  Current Orders: Novolog Sensitive Correction Scale/ SSI (0-9 units) Q4 hours      MD- If within goals of care for this patient, may consider restarting a portion of home dose of Levemir insulin:  Recommend Levemir 5 units BID (50% home dose)      Treated with IVF and started on IV Insulin drip.  IV Insulin drip off at 3am yesterday.  Novolog SSI started yesterday at 7am.    --Will follow patient during hospitalization--  Ambrose FinlandJeannine Johnston Brace Welte RN, MSN, CDE Diabetes Coordinator Inpatient Glycemic Control Team Team Pager: [phone] (8a-5p)

## 2018-11-22 NOTE — Progress Notes (Signed)
Palliative: Diana Hartman is lying quietly in bed.  She has known dementia and is unable to make her basic needs known.  She appears chronically ill and frail, but she looks much improved from yesterday.  Present today today at bedside is daughter, Diana Hartman.  Diana Hartman tells me that she is durable and healthcare power of attorney.  She shares that Diana Hartman lives in her own home, but Debra's twin brother Diana Hartman lives with her.  Daryl has heart disease and has history of CABG.  [name] lives around the corner and is also available to provide care.  Diana Hartman shares that family has a rotation schedule for caring for Diana Hartman.  Diana Hartman shares a living story of her mother's care for their young family.  She states that Diana Hartman was a single mother, but worked tirelessly to provide for them.  She states that they all had a new car upon graduation from high school.  Diana Hartman shares that Diana Hartman's husband died in 2011 with lung cancer.  Diana Hartman stop to work to care for her husband.  She feels that this was the beginning of her decline. Diana Hartman  shares that all of Diana Hartman's friends became ill and have passed away, some after residential SNF.  Diana Hartman states that she never wanted to be a resident of an SNF, and family has chosen to care for her at home.  Diana Hartman shares that Diana Hartman had a stroke in September, spent [facility] time at [facility].  She shares that they had recently home health services from [facility].  Diana Hartman states that they are interested in hospice and palliative care of [place] at this point.  She tells me that she was coming close to calling, and is agreeable for the hospital to send Diana Hartman's information to hospice and palliative of [place] for in-home "treat reversible illness" care.  I share a diagram of the chronic illness pathway and we talked about what is normal and expected.  We talked about CODE STATUS.  Diana Hartman  states that her mother had always wanted everything done to keep her alive.  We talked about treat the treatable, but allowing it a natural death.  We talked about how to make choices for loved ones including 1) keeping them at the center of decision-making (not I want dad to make it to my 40th wedding anniversary), 2) are we doing something for her or to her, can we change what is happening 3) how would the woman Diana Hartman was 5 years ago tell them to care for this human being.  Diana Hartman states that she is considering how best to care for her mother.  Conference with nursing staff related to goals of care discussion, patient needs. Conference with hospitalist related to goals of care discussion, CODE STATUS discussions. Conference with case management related to goals of care discussion, patient/family needs, referral [facility].  55 minutes, extended time Lillia Carmelasha Dove, NP Palliative medicine team [phone] Greater than 50% of this time was spent counseling and coordinating care related to the above assessment and plan.

## 2018-11-23 LAB — COMPREHENSIVE METABOLIC PANEL
ALT: 62 U/L — ABNORMAL HIGH (ref 0–44)
AST: 100 U/L — ABNORMAL HIGH (ref 15–41)
Albumin: 2.1 g/dL — ABNORMAL LOW (ref 3.5–5.0)
Alkaline Phosphatase: 208 U/L — ABNORMAL HIGH (ref 38–126)
Anion gap: 10 (ref 5–15)
BUN: 11 mg/dL (ref 8–23)
CO2: 24 mmol/L (ref 22–32)
Calcium: 8.4 mg/dL — ABNORMAL LOW (ref 8.9–10.3)
Chloride: 118 mmol/L — ABNORMAL HIGH (ref 98–111)
Creatinine, Ser: 0.76 mg/dL (ref 0.44–1.00)
GFR calc Af Amer: >60 mL/min (ref >60)
GFR calc non Af Amer: >60 mL/min (ref >60)
Glucose, Bld: 164 mg/dL — ABNORMAL HIGH (ref 70–99)
Potassium: 4.6 mmol/L (ref 3.5–5.1)
Sodium: 152 mmol/L — ABNORMAL HIGH (ref 135–145)
Total Bilirubin: 1.1 mg/dL (ref 0.3–1.2)
Total Protein: 7.3 g/dL (ref 6.5–8.1)

## 2018-11-23 LAB — GLUCOSE, CAPILLARY
GLUCOSE-CAPILLARY: 329 mg/dL — AB (ref 70–99)
Glucose-Capillary: 151 mg/dL — ABNORMAL HIGH (ref 70–99)
Glucose-Capillary: 313 mg/dL — ABNORMAL HIGH (ref 70–99)

## 2018-11-23 LAB — CBC
HCT: 34.1 % — ABNORMAL LOW (ref 36.0–46.0)
Hemoglobin: 11.2 g/dL — ABNORMAL LOW (ref 12.0–15.0)
MCH: 30.3 pg (ref 26.0–34.0)
MCHC: 32.8 g/dL (ref 30.0–36.0)
MCV: 92.2 fL (ref 80.0–100.0)
Platelets: 52 10*3/uL — ABNORMAL LOW (ref 150–400)
RBC: 3.7 MIL/uL — ABNORMAL LOW (ref 3.87–5.11)
RDW: 16.4 % — ABNORMAL HIGH (ref 11.5–15.5)
WBC: 10 10*3/uL (ref 4.0–10.5)
nRBC: 0 % (ref 0.0–0.2)

## 2018-11-23 LAB — PHOSPHORUS: Phosphorus: 2.2 mg/dL — ABNORMAL LOW (ref 2.5–4.6)

## 2018-11-23 MED ORDER — WHITE PETROLATUM EX OINT
TOPICAL_OINTMENT | CUTANEOUS | Status: DC | PRN
  Filled 2018-11-23: qty 28.35

## 2018-11-23 MED ORDER — DEXTROSE 5 % IV SOLN
INTRAVENOUS | Status: DC
  Administered 2018-11-23 – 2018-11-24 (×2): via INTRAVENOUS

## 2018-11-23 MED ORDER — INSULIN DETEMIR 100 UNIT/ML ~~LOC~~ SOLN
8.0000 [IU] | Freq: Two times a day (BID) | SUBCUTANEOUS | Status: DC
  Administered 2018-11-23 – 2018-11-24 (×2): 8 [IU] via SUBCUTANEOUS
  Filled 2018-11-23 (×2): qty 0.08

## 2018-11-23 NOTE — Progress Notes (Signed)
Palliative: [name] is resting quietly in bed.  Nursing staff is at bedside attending to her needs.  There is no family present at this time.  [name] will open her eyes when I talk to her, but not look in my direction.  She does not interact with me in any meaningful way today. [name] goal is to return home with the benefits of hospice and palliative care of [facility] with treat reversible illness care.  Family is considering CODE STATUS.  No charge Diana Carmelasha Trinitee Horgan, NP Palliative medicine team [phone] Greater than 50% of this time was spent counseling and coordinating care related to the above assessment and plan

## 2018-11-23 NOTE — Progress Notes (Signed)
Diana Hartman  [id number]:[id number] DOB: 05/10/1941 DOA: 11/20/2018 [facility]    Brief Narrative:  77 y.o. female w/ a hx of Dementia, stroke with R sided deficits, DM2, HTN, and temporal arteritis many years ago who presented from her SNF with decreased LOC. Daughter reported decreased PO intake over a couple of days.    In the ED she was found to have a glucose of 936, sodium 151, Creat 1.4 up from 0.7 baseline. CT head noted nothing acute. CT abd/pelvis noted fecal impaction in rectum but no stercoral colitis.   Subjective:  Patient herself is confused, demented, cannot provide any complaints  Assessment & Plan:  Acute metabolic encephalopathy -To be multifactorial, baseline dementia  -Some acute factors contributing, elevated ammonia levels, HON K, and hypernatremia  - B12 and folate not low   HONK CBGs variable but within a reasonable range - cont SSI - liberalize diet to improve intake and follow for insulin needs   Hypernatremia Slowly improving half-normal saline, will change to D5W   Hypophosphatemia   Supplement orally and follow  Hypokalemia Mg is normal - supplement and follow   AKI crt has improved w/ simple volume expansion   Dementia - chronic  Stool impaction  Milk and Molasses enema ordered x1 - appears to have resolve in ED   HTN  BP reasonably controlled and stable   Transaminitis  Slightly increased today - follow   DVT prophylaxis: lovenox  Code Status: FULL CODE Family Communication: none at bedside  Disposition Plan: pending further work up  Consultants:  Palliative Care   Antimicrobials:  none   Objective: Blood pressure (!) 152/82, pulse 75, temperature 98.7 F (37.1 C), temperature source Oral, resp. rate (!) 26, SpO2 100 %.  Intake/Output Summary (Last 24 hours) at 11/23/2018 1432 Last data filed at 11/23/2018 1135 Gross per 24 hour  Intake 716.57 ml  Output -  Net 716.57 ml   There were no  vitals filed for this visit.  Examination:  Awake , frail, confused, does not follow commands or answer questions appropriately  symmetrical Chest wall movement, Good air movement bilaterally, CTAB RRR,No Gallops,Rubs or new Murmurs, No Parasternal Heave +ve B.Sounds, Abd Soft, No tenderness, No rebound - guarding or rigidity. No Cyanosis, Clubbing or edema, No new Rash or bruise      CBC: Recent Labs  Lab 11/20/18 1909 11/22/18 0349 11/23/18 0424  WBC 10.5 10.0 10.0  HGB 11.7* 10.2* 11.2*  HCT 39.4 32.8* 34.1*  MCV 95.9 92.1 92.2  PLT 146* 100* 52*   Basic Metabolic Panel: Recent Labs  Lab 11/21/18 1025 11/21/18 1327 11/21/18 1653 11/22/18 0349 11/23/18 0424  NA 155* 154* 155* 153* 152*  K 3.3* 3.2* 3.1* 3.1* 4.6  CL 121* 120* 119* 117* 118*  CO2 29 28 27 28 24   GLUCOSE 184* 191* 168* 290* 164*  BUN 21 20 19 19 11   CREATININE 1.12* 1.00 0.99 0.96 0.76  CALCIUM 8.6* 8.5* 8.6* 8.5* 8.4*  MG  --   --   --  2.2  --   PHOS  --   --   --  1.3* 2.2*   GFR: CrCl cannot be calculated (Unknown ideal weight.).  Liver Function Tests: Recent Labs  Lab 11/20/18 1909 11/21/18 0557 11/22/18 0349 11/23/18 0424  AST 73* 67* 113* 100*  ALT 74* 62* 65* 62*  ALKPHOS 269* 225* 208* 208*  BILITOT 0.9 0.7 0.8 1.1  PROT 8.7* 8.0 7.5 7.3  ALBUMIN 2.3* 2.1* 2.1* 2.1*   HbA1C: Hgb A1c MFr Bld  Date/Time Value Ref Range Status  06/28/2018 10:57 AM 6.3 (H) 4.8 - 5.6 % Final    Comment:    (NOTE) Pre diabetes:          5.7%-6.4% Diabetes:              >6.4% Glycemic control for   <7.0% adults with diabetes   07/04/2015 06:09 PM 6.8 (H) 4.8 - 5.6 % Final    Comment:    (NOTE)         Pre-diabetes: 5.7 - 6.4         Diabetes: >6.4         Glycemic control for adults with diabetes: <7.0     CBG: Recent Labs  Lab 11/22/18 1234 11/22/18 1547 11/22/18 1717 11/22/18 2055 11/23/18 0754  GLUCAP 320* 297* 361* 254* 151*    Scheduled Meds: . aspirin EC  81 mg Oral  Daily  . atorvastatin  80 mg Oral q1800  . clopidogrel  75 mg Oral Daily  . donepezil  10 mg Oral Daily  . enoxaparin (LOVENOX) injection  30 mg Subcutaneous Q24H  . insulin aspart  0-5 Units Subcutaneous QHS  . insulin aspart  0-9 Units Subcutaneous TID WC  . insulin detemir  8 Units Subcutaneous BID  . lactulose  10 g Oral BID  . nebivolol  5 mg Oral Daily  . potassium & sodium phosphates  2 packet Oral TID AC & HS  . sertraline  50 mg Oral QHS   Continuous Infusions: . sodium chloride 80 mL/hr at 11/23/18 1251     LOS: 2 days    Huey Bienenstock, MD [facility] Office  [phone] Pager - Text Page per Loretha Stapler as per below:  On-Call/Text Page:      [email or web address]  If 7PM-7AM, please contact night-coverage [email or web address] 11/23/2018, 2:32 PM

## 2018-11-24 LAB — GLUCOSE, CAPILLARY
GLUCOSE-CAPILLARY: 248 mg/dL — AB (ref 70–99)
Glucose-Capillary: 228 mg/dL — ABNORMAL HIGH (ref 70–99)
Glucose-Capillary: 387 mg/dL — ABNORMAL HIGH (ref 70–99)
Glucose-Capillary: 328 mg/dL — ABNORMAL HIGH (ref 70–99)

## 2018-11-24 LAB — BASIC METABOLIC PANEL
Anion gap: 10 (ref 5–15)
BUN: 7 mg/dL — ABNORMAL LOW (ref 8–23)
CO2: 21 mmol/L — ABNORMAL LOW (ref 22–32)
CREATININE: 0.75 mg/dL (ref 0.44–1.00)
Calcium: 7.9 mg/dL — ABNORMAL LOW (ref 8.9–10.3)
Chloride: 110 mmol/L (ref 98–111)
GFR calc non Af Amer: >60 mL/min (ref >60)
Glucose, Bld: 257 mg/dL — ABNORMAL HIGH (ref 70–99)
Potassium: 3.9 mmol/L (ref 3.5–5.1)
Sodium: 141 mmol/L (ref 135–145)

## 2018-11-24 MED ORDER — INSULIN DETEMIR 100 UNIT/ML ~~LOC~~ SOLN
12.0000 [IU] | Freq: Two times a day (BID) | SUBCUTANEOUS | Status: DC
  Administered 2018-11-24: 12 [IU] via SUBCUTANEOUS
  Filled 2018-11-24 (×2): qty 0.12

## 2018-11-24 NOTE — Progress Notes (Signed)
Inpatient Diabetes Program Recommendations  AACE/Yarden: New Consensus Statement on Inpatient Glycemic Control (2019)  Target Ranges:  Prepandial:   less than 140 mg/dL      Peak postprandial:   less than 180 mg/dL (1-2 hours)      Critically ill patients:  140 - 180 mg/dL   Results for Jamelle HaringHENDERSON, Tonna B (MRN [id number]) as of 11/24/2018 12:57  Ref. Range 11/23/2018 07:54 11/23/2018 17:21 11/23/2018 21:21 11/24/2018 07:54 11/24/2018 12:22  Glucose-Capillary Latest Ref Range: 70 - 99 mg/dL 742151 (H) 595329 (H) 638313 (H) 228 (H) 248 (H)    Review of Glycemic Control  Current orders for Inpatient glycemic control: Levemir 8 units BID, Novolog 0-9 units TID with meals, Novolog 0-5 units QHS  Inpatient Diabetes Program Recommendations: Insulin - Basal: Please consider increasing Levemir to 12 units BID.  Thanks, Orlando PennerMarie Juanjesus Pepperman, RN, MSN, CDE Diabetes Coordinator Inpatient Diabetes Program [phone] (Team Pager from 8am to 5pm)

## 2018-11-24 NOTE — Progress Notes (Signed)
[facility]ia[facility]) RN visit.   Notified by Debroah LoopAngela Cole,CMRN of patient/family request [facility] after discharge. Chart and patient [facility]. Hospice eligibility pending at this time.   Writer spoke with daughter and son at bedside to initiate education related to hospice philosophy, services and team approach to care. Family verbalized understanding of information given. Per discussion, [facility].   Please send signed and completed DNR form home with patient/family. Patient will need prescriptions for discharge comfort medications.   DME needs have been discussed, patient currently has the following equipment in the home: hospital bed, 3N1, and W/C.  Patient/family requests the following DME for delivery to the home: none.    [facility] Referral Center aware of the above. Please notify [facility] when patient is ready to leave the unit at discharge. (Call [phone] or [phone] after 5pm.) [facility] information and contact numbers given to family  at time of visit. Above information shared with Gae Gallopngela Cole, CMRN.   Please call with any hospice related questions.   Thank you for this referral.   Elsie SaasMary Anne Robertson, RN, [facility] Hospital Liaison [phone] ? Hospital liaisons are now on AMION.

## 2018-11-24 NOTE — Care Management Important Message (Signed)
Important Message  Patient Details  Name: Diana Hartman MRN: [id number] Date of Birth: 1941/07/24   Medicare Important Message Given:  Yes    Veera Stapleton Stefan ChurchBratton 11/24/2018, 3:50 PM

## 2018-11-24 NOTE — Progress Notes (Signed)
Diana Hartman  QIO:[id number] DOB: 1941-08-10 DOA: 11/20/2018 [facility]: [name]    Brief Narrative:  77 y.o. female w/ a hx of Dementia, stroke with R sided deficits, DM2, HTN, and temporal arteritis many years ago who presented from her SNF with decreased LOC. Daughter reported decreased PO intake over a couple of days.    In the ED she was found to have a glucose of 936, sodium 151, Creat 1.4 up from 0.7 baseline. CT head noted nothing acute. CT abd/pelvis noted fecal impaction in rectum but no stercoral colitis.  Subjective:  Patient herself is confused, demented, cannot provide any complaints  Assessment & Plan:  Acute metabolic encephalopathy - Appears To be multifactorial, baseline dementia  -Some acute factors contributing, elevated ammonia levels, HONK, and hypernatremia  - B12 and folate not low   HONK -CBG were more elevated yesterday , this is most likely in the setting of D5W use, I have increased her Lantus to 12 units twice daily, continue with a sliding scale .  Hypernatremia -Resolved with D5W overnight, it is 141 today, I have stopped fluid  Hypophosphatemia   Supplement orally and follow  Hypokalemia Mg is normal - supplement and follow   AKI crt has improved w/ simple volume expansion   Dementia - chronic  Stool impaction  Milk and Molasses enema ordered x1 - appears to have resolve in ED   HTN  BP reasonably controlled and stable   Transaminitis  Slightly increased today - follow   DVT prophylaxis: lovenox  Code Status: FULL CODE Family Communication: none at bedside  Disposition Plan: pending further work up  Consultants:  Palliative Care   Antimicrobials:  none   Objective: Blood pressure 130/80, pulse 82, temperature 98.9 F (37.2 C), temperature source Oral, resp. rate 16, SpO2 99 %.  Intake/Output Summary (Last 24 hours) at 11/24/2018 1408 Last data filed at 11/24/2018 0900 Gross per 24 hour  Intake  1700.97 ml  Output -  Net 1700.97 ml   There were no vitals filed for this visit.  Examination:  Awake, frail, chronically ill-appearing, more alert today, Symmetrical Chest wall movement, Good air movement bilaterally, CTAB RRR,No Gallops,Rubs or new Murmurs, No Parasternal Heave +ve B.Sounds, Abd Soft, No tenderness, No rebound - guarding or rigidity. No Cyanosis, Clubbing or edema, No new Rash or bruise     CBC: Recent Labs  Lab 11/20/18 1909 11/22/18 0349 11/23/18 0424  WBC 10.5 10.0 10.0  HGB 11.7* 10.2* 11.2*  HCT 39.4 32.8* 34.1*  MCV 95.9 92.1 92.2  PLT 146* 100* 52*   Basic Metabolic Panel: Recent Labs  Lab 11/21/18 1327 11/21/18 1653 11/22/18 0349 11/23/18 0424 11/24/18 0404  NA 154* 155* 153* 152* 141  K 3.2* 3.1* 3.1* 4.6 3.9  CL 120* 119* 117* 118* 110  CO2 28 27 28 24  21*  GLUCOSE 191* 168* 290* 164* 257*  BUN 20 19 19 11  7*  CREATININE 1.00 0.99 0.96 0.76 0.75  CALCIUM 8.5* 8.6* 8.5* 8.4* 7.9*  MG  --   --  2.2  --   --   PHOS  --   --  1.3* 2.2*  --    GFR: CrCl cannot be calculated (Unknown ideal weight.).  Liver Function Tests: Recent Labs  Lab 11/20/18 1909 11/21/18 0557 11/22/18 0349 11/23/18 0424  AST 73* 67* 113* 100*  ALT 74* 62* 65* 62*  ALKPHOS 269* 225* 208* 208*  BILITOT 0.9 0.7 0.8 1.1  PROT 8.7*  8.0 7.5 7.3  ALBUMIN 2.3* 2.1* 2.1* 2.1*   HbA1C: Hgb A1c MFr Bld  Date/Time Value Ref Range Status  06/28/2018 10:57 AM 6.3 (H) 4.8 - 5.6 % Final    Comment:    (NOTE) Pre diabetes:          5.7%-6.4% Diabetes:              >6.4% Glycemic control for   <7.0% adults with diabetes   07/04/2015 06:09 PM 6.8 (H) 4.8 - 5.6 % Final    Comment:    (NOTE)         Pre-diabetes: 5.7 - 6.4         Diabetes: >6.4         Glycemic control for adults with diabetes: <7.0     CBG: Recent Labs  Lab 11/23/18 0754 11/23/18 1721 11/23/18 2121 11/24/18 0754 11/24/18 1222  GLUCAP 151* 329* 313* 228* 248*    Scheduled  Meds: . aspirin EC  81 mg Oral Daily  . atorvastatin  80 mg Oral q1800  . clopidogrel  75 mg Oral Daily  . donepezil  10 mg Oral Daily  . enoxaparin (LOVENOX) injection  30 mg Subcutaneous Q24H  . insulin aspart  0-5 Units Subcutaneous QHS  . insulin aspart  0-9 Units Subcutaneous TID WC  . insulin detemir  12 Units Subcutaneous BID  . lactulose  10 g Oral BID  . nebivolol  5 mg Oral Daily  . potassium & sodium phosphates  2 packet Oral TID AC & HS  . sertraline  50 mg Oral QHS   Continuous Infusions:    LOS: 3 days    Huey Bienenstock, MD [facility] Office  [phone] Pager - Text Page per Loretha Stapler as per below:  On-Call/Text Page:      [email or web address]  If 7PM-7AM, please contact night-coverage [email or web address] 11/24/2018, 2:08 PM

## 2018-11-24 NOTE — Progress Notes (Signed)
NCM received consult for home hospice. NCM @ bedside with son and daughter. Both stated would like for home hospice referral to be made to determine if they would like to d/c mom home with hospice care vs home with home health services. Choice provided to children. [facility] selected. Referral made to [facility]/ Chales AbrahamsMary Ann.  Gae GallopAngela Duvid Smalls RN,BSN,CM

## 2018-11-24 NOTE — Care Management Note (Signed)
Case Management Note  Patient Details  Name: Diana Hartman MRN: [id number] Date of Birth: 1941-03-24  Subjective/Objective:    Acute  Metabolic encephalopathy. Hx ofDementia, stroke with R sided deficits, DM2, HTN, and temporal arteritis    From home with son,[name]. Bed bound.                 [name] (Daughter) [name] (Daughter) [name] [phone](Son)  [phone]        [phone]                              [facility]  Action/Plan: Transition to home with home health services vs home with hospice care. NCM to f/u with TOC needs after family's conversation with hospice liaison.  PTAR will be needed for transportation to home.  Expected Discharge Date:                  Expected Discharge Plan:     In-House Referral:     Discharge planning Services  CM Consult  Post Acute Care Choice:    Choice offered to:  Adult Children  DME Arranged:  N/A DME Agency:  NA  HH Arranged:  NA HH Agency:  NA  Status of Service:  In process, will continue to follow  If discussed at Long Length of Stay Meetings, dates discussed:    Additional Comments:  Epifanio LeschesCole, Rasheema Truluck Hudson, RN 11/24/2018, 2:17 PM

## 2018-11-25 LAB — GLUCOSE, CAPILLARY
Glucose-Capillary: 357 mg/dL — ABNORMAL HIGH (ref 70–99)
Glucose-Capillary: 423 mg/dL — ABNORMAL HIGH (ref 70–99)
Glucose-Capillary: 398 mg/dL — ABNORMAL HIGH (ref 70–99)
Glucose-Capillary: 261 mg/dL — ABNORMAL HIGH (ref 70–99)

## 2018-11-25 MED ORDER — GLIPIZIDE 5 MG PO TABS
10.0000 mg | ORAL_TABLET | Freq: Every day | ORAL | Status: DC
  Administered 2018-11-25 – 2018-11-27 (×3): 10 mg via ORAL
  Filled 2018-11-25 (×3): qty 2

## 2018-11-25 MED ORDER — INSULIN GLARGINE 100 UNIT/ML ~~LOC~~ SOLN
5.0000 [IU] | Freq: Once | SUBCUTANEOUS | Status: AC
  Administered 2018-11-25: 5 [IU] via SUBCUTANEOUS
  Filled 2018-11-25: qty 0.05

## 2018-11-25 MED ORDER — GLUCERNA SHAKE PO LIQD
237.0000 mL | Freq: Three times a day (TID) | ORAL | Status: DC
  Administered 2018-11-25 – 2018-11-27 (×7): 237 mL via ORAL

## 2018-11-25 MED ORDER — SODIUM CHLORIDE 0.45 % IV SOLN
INTRAVENOUS | Status: AC
  Administered 2018-11-25 – 2018-11-26 (×2): via INTRAVENOUS

## 2018-11-25 MED ORDER — INSULIN ASPART 100 UNIT/ML ~~LOC~~ SOLN
15.0000 [IU] | Freq: Once | SUBCUTANEOUS | Status: AC
  Administered 2018-11-25: 15 [IU] via SUBCUTANEOUS

## 2018-11-25 MED ORDER — INSULIN DETEMIR 100 UNIT/ML ~~LOC~~ SOLN
15.0000 [IU] | Freq: Two times a day (BID) | SUBCUTANEOUS | Status: DC
  Administered 2018-11-25: 15 [IU] via SUBCUTANEOUS
  Filled 2018-11-25 (×2): qty 0.15

## 2018-11-25 MED ORDER — INSULIN DETEMIR 100 UNIT/ML ~~LOC~~ SOLN
20.0000 [IU] | Freq: Two times a day (BID) | SUBCUTANEOUS | Status: DC
  Administered 2018-11-25: 20 [IU] via SUBCUTANEOUS
  Filled 2018-11-25 (×2): qty 0.2

## 2018-11-25 MED ORDER — INSULIN ASPART 100 UNIT/ML ~~LOC~~ SOLN
18.0000 [IU] | Freq: Once | SUBCUTANEOUS | Status: AC
  Administered 2018-11-25: 18 [IU] via SUBCUTANEOUS

## 2018-11-25 NOTE — Care Management Note (Addendum)
Case Management Note  Patient Details  Name: Jamelle Haringda B Wiley MRN: [id number] Date of Birth: [birth year]  Subjective/Objective:   Presented with AMS, hx of Dementia, stroke with R sided deficits, DM2, HTN, temporal arteritis many years ago.        [name] (Daughter) [name] (Daughter)   [name] [phone](Son)  [phone]        [phone]        Action/Plan: Transition to home with home hospice care/[facility]. Son Rachael FeeDarryl states mom will have 24/7 supervision from family. PTAR will be needed for transportation to home.  Please send signed and completed DNR/ gold form home with pt @ discharged.  Expected Discharge Date:    11/26/2018          Expected Discharge Plan:  Home w Hospice Care  In-House Referral:  NA  Discharge planning Services  CM Consult  Post Acute Care Choice:    Choice offered to:  Adult Children  DME Arranged:  N/A, no equipment needed, pt already has hospital bed, 3N1, and W/C DME Agency:  NA  HH [facility]  [facility]  [facility]  Status of Service:  Completed, signed off  If discussed at Long Length of Stay Meetings, dates discussed:    Additional Comments:  Epifanio LeschesCole, Smita Lesh Hudson, RN 11/25/2018, 1:58 PM

## 2018-11-25 NOTE — Progress Notes (Signed)
Diana Hartman  WUJ:[id number] DOB: 03-20-1941 DOA: 11/20/2018 [facility]: [name]    Brief Narrative:  77 y.o. female w/ a hx of Dementia, stroke with R sided deficits, DM2, HTN, and temporal arteritis many years ago who presented from her SNF with decreased LOC. Daughter reported decreased PO intake over a couple of days.    In the ED she was found to have a glucose of 936, sodium 151, Creat 1.4 up from 0.7 baseline. CT head noted nothing acute. CT abd/pelvis noted fecal impaction in rectum but no stercoral colitis.  Subjective:  Patient herself is confused, demented, cannot provide any complaints, no significant events overnight, CBG is uncontrolled this morning  Assessment & Plan:  Acute metabolic encephalopathy - Appears To be multifactorial, significant baseline dementia  -Some acute factors contributing, elevated ammonia levels, HONK, and hypernatremia  - B12 and folate not low   HONK -CBGs are significantly uncontrolled, initially secondary to D5W but, but more recently secondary to juices with her dysphagia 1 diet, diet has been changed to dysphagia 1 carb modified, I have increased her Lantus to 18 units twice daily, I have resumed her home dose glipizide, I will start her on gentle hydration today, and continue with insulin sliding scale .  Hypernatremia -Resolved   Hypophosphatemia   Supplement orally and follow  Hypokalemia Mg is normal - supplement and follow   AKI crt has improved w/ simple volume expansion   Dementia - chronic  Stool impaction  Milk and Molasses enema ordered x1 - appears to have resolve in ED   HTN  BP reasonably controlled and stable   Transaminitis  Trending down  DVT prophylaxis: lovenox  Code Status: FULL CODE Family Communication: Cussed with son and daughter bedside 11/24/2018 Disposition Plan: Home with hospice tomorrow  Consultants:  Palliative Care   Antimicrobials:  none   Objective: Blood  pressure 129/73, pulse 78, temperature 98.4 F (36.9 C), temperature source Oral, resp. rate (!) 24, SpO2 100 %.  Intake/Output Summary (Last 24 hours) at 11/25/2018 1336 Last data filed at 11/25/2018 1000 Gross per 24 hour  Intake 134.03 ml  Output -  Net 134.03 ml   There were no vitals filed for this visit.  Examination:  Awake, frail, chronically ill-appearing Symmetrical Chest wall movement, Good air movement bilaterally, CTAB RRR,No Gallops,Rubs or new Murmurs, No Parasternal Heave +ve B.Sounds, Abd Soft, No tenderness, No rebound - guarding or rigidity. No Cyanosis, Clubbing or edema, No new Rash or bruise      CBC: Recent Labs  Lab 11/20/18 1909 11/22/18 0349 11/23/18 0424  WBC 10.5 10.0 10.0  HGB 11.7* 10.2* 11.2*  HCT 39.4 32.8* 34.1*  MCV 95.9 92.1 92.2  PLT 146* 100* 52*   Basic Metabolic Panel: Recent Labs  Lab 11/21/18 1327 11/21/18 1653 11/22/18 0349 11/23/18 0424 11/24/18 0404  NA 154* 155* 153* 152* 141  K 3.2* 3.1* 3.1* 4.6 3.9  CL 120* 119* 117* 118* 110  CO2 28 27 28 24  21*  GLUCOSE 191* 168* 290* 164* 257*  BUN 20 19 19 11  7*  CREATININE 1.00 0.99 0.96 0.76 0.75  CALCIUM 8.5* 8.6* 8.5* 8.4* 7.9*  MG  --   --  2.2  --   --   PHOS  --   --  1.3* 2.2*  --    GFR: CrCl cannot be calculated (Unknown ideal weight.).  Liver Function Tests: Recent Labs  Lab 11/20/18 1909 11/21/18 0557 11/22/18 0349 11/23/18 0424  AST 73* 67* 113* 100*  ALT 74* 62* 65* 62*  ALKPHOS 269* 225* 208* 208*  BILITOT 0.9 0.7 0.8 1.1  PROT 8.7* 8.0 7.5 7.3  ALBUMIN 2.3* 2.1* 2.1* 2.1*   HbA1C: Hgb A1c MFr Bld  Date/Time Value Ref Range Status  06/28/2018 10:57 AM 6.3 (H) 4.8 - 5.6 % Final    Comment:    (NOTE) Pre diabetes:          5.7%-6.4% Diabetes:              >6.4% Glycemic control for   <7.0% adults with diabetes   07/04/2015 06:09 PM 6.8 (H) 4.8 - 5.6 % Final    Comment:    (NOTE)         Pre-diabetes: 5.7 - 6.4         Diabetes: >6.4          Glycemic control for adults with diabetes: <7.0     CBG: Recent Labs  Lab 11/24/18 1222 11/24/18 1702 11/24/18 2120 11/25/18 0757 11/25/18 1139  GLUCAP 248* 387* 328* 423* 398*    Scheduled Meds: . aspirin EC  81 mg Oral Daily  . atorvastatin  80 mg Oral q1800  . clopidogrel  75 mg Oral Daily  . donepezil  10 mg Oral Daily  . enoxaparin (LOVENOX) injection  30 mg Subcutaneous Q24H  . feeding supplement (GLUCERNA SHAKE)  237 mL Oral TID BM  . glipiZIDE  10 mg Oral QAC breakfast  . insulin aspart  0-5 Units Subcutaneous QHS  . insulin aspart  0-9 Units Subcutaneous TID WC  . insulin detemir  20 Units Subcutaneous BID  . lactulose  10 g Oral BID  . nebivolol  5 mg Oral Daily  . potassium & sodium phosphates  2 packet Oral TID AC & HS  . sertraline  50 mg Oral QHS   Continuous Infusions: . sodium chloride 75 mL/hr at 11/25/18 1214     LOS: 4 days    Huey Bienenstock, MD [facility] Office  [phone] Pager - Text Page per Loretha Stapler as per below:  On-Call/Text Page:      [email or web address]  If 7PM-7AM, please contact night-coverage [email or web address] 11/25/2018, 1:36 PM

## 2018-11-25 NOTE — Progress Notes (Signed)
[facility]) RN visit.   Notified by Debroah LoopAngela Cole,CMRN of patient/family request for [facility] at [facility] after discharge. Chart and patient information under [facility]. Hospice eligibility confirmed.  Writer spoke with daughter and son at bedside to initiate education related to hospice philosophy, services and team approach to care on 11/24/2018. Family verbalized understanding of information given. Per discussion, plan is for discharge to home by PTAR at time of discharge.   Please send signed and completed DNR form home with patient/family. Patient will need prescriptions for discharge comfort medications.   DME needs have been discussed, patient currently has the following equipment in the home: hospital bed, 3N1, and W/C.  Patient/family requests the following DME for delivery to the home: none.    [facility] Referral Center aware of the above. Please notify [facility] when patient is ready to leave the unit at discharge. (Call [phone] or [phone] after 5pm.) [facility] information and contact numbers given to family  at time of visit. Above information shared with Gae Gallopngela Cole, CMRN.   Please call with any hospice related questions.   Thank you for this referral.   Elsie SaasMary Anne Robertson, RN, [facility] Hospital Liaison [phone] ? Hospital liaisons are now on AMION.

## 2018-11-26 LAB — GLUCOSE, CAPILLARY
GLUCOSE-CAPILLARY: 319 mg/dL — AB (ref 70–99)
Glucose-Capillary: 325 mg/dL — ABNORMAL HIGH (ref 70–99)
Glucose-Capillary: 172 mg/dL — ABNORMAL HIGH (ref 70–99)
Glucose-Capillary: 149 mg/dL — ABNORMAL HIGH (ref 70–99)
Glucose-Capillary: 198 mg/dL — ABNORMAL HIGH (ref 70–99)

## 2018-11-26 MED ORDER — INSULIN LISPRO 100 UNIT/ML ~~LOC~~ SOLN: SUBCUTANEOUS | 3 refills | Status: AC

## 2018-11-26 MED ORDER — INSULIN ASPART 100 UNIT/ML ~~LOC~~ SOLN
8.0000 [IU] | Freq: Once | SUBCUTANEOUS | Status: AC
  Administered 2018-11-26: 8 [IU] via SUBCUTANEOUS

## 2018-11-26 MED ORDER — LACTULOSE 10 GM/15ML PO SOLN: 10.0000 g | Freq: Two times a day (BID) | ORAL | 1 refills | Status: AC

## 2018-11-26 MED ORDER — INSULIN DETEMIR 100 UNIT/ML ~~LOC~~ SOLN: 26.0000 [IU] | Freq: Two times a day (BID) | SUBCUTANEOUS | 1 refills | Status: AC

## 2018-11-26 MED ORDER — INSULIN DETEMIR 100 UNIT/ML ~~LOC~~ SOLN
26.0000 [IU] | Freq: Two times a day (BID) | SUBCUTANEOUS | Status: DC
  Administered 2018-11-26 – 2018-11-27 (×3): 26 [IU] via SUBCUTANEOUS
  Filled 2018-11-26 (×4): qty 0.26

## 2018-11-26 MED ORDER — INSULIN DETEMIR 100 UNIT/ML ~~LOC~~ SOLN
24.0000 [IU] | Freq: Two times a day (BID) | SUBCUTANEOUS | Status: DC
  Filled 2018-11-26: qty 0.24

## 2018-11-26 NOTE — Progress Notes (Addendum)
This NCM spoke with daughter Stanton KidneyDebra  [phone], she states she is [place] in [place] and will not be back until tomorrow.  She will transport patient home tomorrow by car/ wheelchair.  This NCM informed RN and MD, and Hospice RN of this information.  [facility] on Sunday.

## 2018-11-26 NOTE — Discharge Summary (Signed)
Diana Hartman, is a 77 y.o. female  DOB 08/17/1941  MRN [id number].  Admission date:  11/20/2018  Admitting Physician  Hillary Bow, DO  Discharge Date:  11/26/2018   [facility]  Recommendations for primary care physician for things to follow:  -Other management per hospice -Continue with close monitoring of CBG, encourage oral intake, fluid intake, adjust insulin regimen as needed  Admission Diagnosis  Hypernatremia [E87.0] Hyperglycemia [R73.9] Altered mental status, unspecified altered mental status type [R41.82]   Discharge Diagnosis  Hypernatremia [E87.0] Hyperglycemia [R73.9] Altered mental status, unspecified altered mental status type [R41.82]    Principal Problem:   Acute metabolic encephalopathy Active Problems:   Dementia without behavioral disturbance (HCC)   Hypernatremia   Hyperglycemia without ketosis   Diabetes mellitus type 2 in nonobese (HCC)   Benign essential HTN   Fecal impaction in rectum (HCC)   Goals of care, counseling/discussion   Palliative care by specialist   Encounter for hospice care discussion      Past Medical History:  Diagnosis Date  . Dementia (HCC)   . Diabetes mellitus   . Hypertension   . Stroke (HCC)   . Temporal arteritis [facility])     Past Surgical History:  Procedure Laterality Date  . NO PAST SURGERIES         History of present illness and  Hospital Course:     Kindly see H&P for history of present illness and admission details, please review complete Labs, Consult reports and Test reports for all details in brief  HPI  from the history and physical done on the day of admission 11/21/2018  HPI: Diana Hartman is a 77 y.o. female with medical history significant of Dementia, stroke with R sided deficits, DM2, HTN, temporal arteritis many years ago.  Patient presents to the ED from SNF with  report of decreased LOC and interaction throughout the day today.  Daughter reports decreased PO intake over last couple of days.  Reports that patient does have a baseline history of dementia but states she is usually alert and will respond to questions. They state her blood sugars have been slightly elevated and reports that yesterday they are in the 400s. Patient has not been complaining of any pain and has not any vomiting or fever.   Hospital Course  77 y.o.femalew/ a hx ofDementia, stroke with R sided deficits, DM2, HTN, and temporal arteritis many years ago who presented from her SNFwith decreased LOC. Daughter reported decreased PO intake over a couple of days.  In the ED she was found to have a glucose of 936, sodium 151, Creat 1.4 up from 0.7 baseline. CT head notednothing acute. CT abd/pelvis noted fecal impaction in rectum but no stercoral colitis.   Acute metabolic encephalopathy - Appears To be multifactorial, significant baseline dementia  -Some acute factors contributing, elevated ammonia levels, HONK, and hypernatremia  - B12 and folate not low  -Continue with lactulose, which was started for elevated ammonia level  HONK -  CBGs are significantly uncontrolled, is on dysphagia 1 diet, carb modified, her insulin regimen has been adjusted, to continue glipizide, her Lantus has been increased to 26 unit BID, so she will be discharged on sliding scale .  Hypernatremia -Resolved   Hypophosphatemia   Demented orally during hospital stay  Hypokalemia Mg is normal -repleted  AKI crt has improved w/ simple volume expansion, resolved  Dementia - chronic  Stool impaction  Milk and Molasses enema ordered x1 - appears to have resolve in ED   HTN BP reasonably controlled and stable , Cardizem and lisinopril has been held on discharge  Transaminitis Trending down  History of nonhemorrhagic CVA -Continue with aspirin and Plavix  Discharge Condition:    Stable - discharged to hospice, I have informed family is high risk for recurrent readmission, given her advanced dementia, and failure to thrive and extreme frailty  Follow UP  Follow-up Information    [facility] [place] Follow up.   Why:  home hospice car arranged Contact information: [address] [place] [address] [phone]            Discharge Instructions  and  Discharge Medications    Discharge Instructions    Discharge instructions   Complete by:  As directed    [facility] in 7 days   Get CBC, CMP, checked  by Primary MD next visit.    Activity: As tolerated with Full fall precautions use walker/cane & assistance as needed   Disposition Home WITH HOSPICE   Diet: Dysphagia 1, carb modified diet, thin liquids, with feeding assistance and aspiration precautions.    On your next visit with your primary care physician please Get Medicines reviewed and adjusted.   Please request your Prim.MD to go over all Hospital Tests and Procedure/Radiological results at the follow up, please get all Hospital records sent to your Prim MD by signing hospital release before you go home.   If you experience worsening of your admission symptoms, develop shortness of breath, life threatening emergency, suicidal or homicidal thoughts you must seek medical attention immediately by calling 911 or calling your MD immediately  if symptoms less severe.  You Must read complete instructions/literature along with all the possible adverse reactions/side effects for all the Medicines you take and that have been prescribed to you. Take any new Medicines after you have completely understood and accpet all the possible adverse reactions/side effects.   Do not drive, operating heavy machinery, perform activities at heights, swimming or participation in water activities or provide baby sitting services if your  were admitted for syncope or siezures until you have seen by Primary MD or a Neurologist and advised to do so again.  Do not drive when taking Pain medications.    Do not take more than prescribed Pain, Sleep and Anxiety Medications  Special Instructions: If you have smoked or chewed Tobacco  in the last 2 yrs please stop smoking, stop any regular Alcohol  and or any Recreational drug use.  Wear Seat belts while driving.   Please note  You were cared for by a hospitalist during your hospital stay. If you have any questions about your discharge medications or the care you received while you were in the hospital after you are discharged, you can call the unit and asked to speak with the hospitalist on call if the hospitalist that took care of you is not available. Once you are discharged, your primary  care physician will handle any further medical issues. Please note that NO REFILLS for any discharge medications will be authorized once you are discharged, as it is imperative that you return to your primary care physician (or establish a relationship with a primary care physician if you do not have one) for your aftercare needs so that they can reassess your need for medications and monitor your lab values.   Increase activity slowly   Complete by:  As directed      Allergies as of 11/26/2018   No Known Allergies     Medication List    STOP taking these medications   diltiazem 120 MG tablet Commonly known as:  CARDIZEM   lisinopril 40 MG tablet Commonly known as:  PRINIVIL,ZESTRIL   zolpidem 10 MG tablet Commonly known as:  AMBIEN     TAKE these medications   aspirin 81 MG EC tablet Take 1 tablet (81 mg total) by mouth daily.   atorvastatin 80 MG tablet Commonly known as:  LIPITOR Take 1 tablet (80 mg total) by mouth daily at 6 PM.   CALCIUM 600+D 600-200 MG-UNIT Tabs Generic drug:  Calcium Carbonate-Vitamin D Take 1 tablet by mouth daily.   clopidogrel 75 MG  tablet Commonly known as:  PLAVIX Take 1 tablet (75 mg total) by mouth daily.   donepezil 10 MG tablet Commonly known as:  ARICEPT Take 10 mg by mouth daily.   glipiZIDE 10 MG tablet Commonly known as:  GLUCOTROL Take 10 mg by mouth daily.   GLUCERNA Liqd Take 237 mLs by mouth 2 (two) times daily between meals. What changed:    when to take this  reasons to take this   insulin detemir 100 UNIT/ML injection Commonly known as:  LEVEMIR Inject 0.26 mLs (26 Units total) into the skin 2 (two) times daily. What changed:  how much to take   insulin lispro 100 UNIT/ML injection Commonly known as:  HUMALOG Use insulin as sliding scale before meals 3 times daily -CBG 150-200, give 2 units  -CBG 201 to 250 give 4 units  -CBG 251-300 give 6 units -CBG 301 to 350 give 8 units -CBG 351-400 give 10 units -CBG 400-450 give 14 units   lactulose 10 GM/15ML solution Commonly known as:  CHRONULAC Take 15 mLs (10 g total) by mouth 2 (two) times daily.   multivitamin tablet Take 1 tablet by mouth daily.   nebivolol 5 MG tablet Commonly known as:  BYSTOLIC Take 5 mg by mouth daily.   sertraline 50 MG tablet Commonly known as:  ZOLOFT Take 50 mg by mouth at bedtime.         Diet and Activity recommendation: See Discharge Instructions above   Consults obtained - Palliative   Major procedures and Radiology Reports - PLEASE review detailed and final reports for all details, in brief -      Ct Head Wo Contrast  Result Date: 11/20/2018 CLINICAL DATA:  Altered mental status.  Hyperglycemic. EXAM: CT HEAD WITHOUT CONTRAST TECHNIQUE: Contiguous axial images were obtained from the base of the skull through the vertex without intravenous contrast. COMPARISON:  MRI of the head June 28, 2018 FINDINGS: BRAIN: No intraparenchymal hemorrhage, mass effect nor midline shift. Severe parenchymal brain volume loss most notable within mesial occipital lobes. No hydrocephalus. Old LEFT  cerebellar infarct. Bifrontal encephalomalacia. Patchy supratentorial white matter hypodensities less than expected for patient's age, though non-specific are most compatible with chronic small vessel ischemic disease. No acute large vascular territory  infarcts. No abnormal extra-axial fluid collections. Basal cisterns are patent. VASCULAR: Moderate calcific atherosclerosis of the carotid siphons. SKULL: No skull fracture. Borderline empty sella. No significant scalp soft tissue swelling. SINUSES/ORBITS: Trace paranasal sinus mucosal thickening. Mastoid air cells are well aerated.The included ocular globes and orbital contents are non-suspicious. Status post bilateral ocular lens implants. OTHER: None. IMPRESSION: 1. No acute intracranial process. 2. Severe parenchymal brain volume loss most notable within mesial temporal lobes associated with neurodegenerative syndromes. 3. Old bifrontal/MCA territory infarcts. Old small LEFT cerebellar infarct. Electronically Signed   By: Awilda Metro M.D.   On: 11/20/2018 22:57   Ct Abdomen Pelvis W Contrast  Result Date: 11/20/2018 CLINICAL DATA:  Abdominal pain. EXAM: CT ABDOMEN AND PELVIS WITH CONTRAST TECHNIQUE: Multidetector CT imaging of the abdomen and pelvis was performed using the standard protocol following bolus administration of intravenous contrast. CONTRAST:  OMNIPAQUE IOHEXOL 300 MG/ML  SOLN COMPARISON:  None. FINDINGS: Lower chest: No acute abnormality. Subsegmental atelectasis in the right lower lobe. Hepatobiliary: No focal liver abnormality is seen. No gallstones, gallbladder wall thickening, or biliary dilatation. Pancreas: Unremarkable. No pancreatic ductal dilatation or surrounding inflammatory changes. Spleen: Normal in size without focal abnormality. Adrenals/Urinary Tract: The adrenal glands are unremarkable. Small bilateral renal cysts. Other subcentimeter low-density lesions in both kidneys are too small to characterize. No renal calculi  or hydronephrosis. Mild circumferential bladder wall thickening. Stomach/Bowel: Stomach is within normal limits. Appendix appears normal. No evidence of bowel wall thickening, distention, or inflammatory changes. Large stool ball in the rectum. Vascular/Lymphatic: Aortic atherosclerosis. No enlarged abdominal or pelvic lymph nodes. Reproductive: Small amount of fluid and air within the endometrial canal. No adnexal mass. Other: Right paraumbilical hernia containing fat and non-dilated transverse colon. No free fluid or pneumoperitoneum. Musculoskeletal: No acute or significant osseous findings. Severe degenerative disc disease of the lower lumbar spine with grade 1 anterolisthesis at L3-L4. IMPRESSION: 1. Large stool ball in the rectum. Correlate for fecal impaction. No significant surrounding inflammatory changes to suggest stercoral colitis. 2. Mild circumferential bladder wall thickening. Correlate with urinalysis to exclude cystitis. 3. Right paraumbilical hernia containing fat and non-dilated transverse colon. 4. Small amount of fluid and air within the endometrial canal. Correlate with any history of recent pelvic exam and for signs or symptoms of endometritis. 5.  Aortic atherosclerosis (ICD10-I70.0). Electronically Signed   By: Obie Dredge M.D.   On: 11/20/2018 23:12    Micro Results     No results found for this or any previous visit (from the past 240 hour(s)).     Today   Subjective:   Celene Kras today patient unable to provide any complaints, no significant events per staff Objective:   Blood pressure (!) 111/58, pulse 80, temperature 98 F (36.7 C), temperature source Oral, resp. rate 17, SpO2 100 %.   Intake/Output Summary (Last 24 hours) at 11/26/2018 1038 Last data filed at 11/25/2018 1500 Gross per 24 hour  Intake 542.85 ml  Output -  Net 542.85 ml    Exam  Awake, frail, chronically ill-appearing Supple Neck,No JVD, No cervical lymphadenopathy appriciated.   Symmetrical Chest wall movement, Good air movement bilaterally, CTAB RRR,No Gallops,Rubs or new Murmurs, No Parasternal Heave +ve B.Sounds, Abd Soft, Non tender,  No rebound -guarding or rigidity. No Cyanosis, Clubbing or edema, No new Rash or bruise  Data Review   CBC w Diff:  Lab Results  Component Value Date   WBC 10.0 11/23/2018   HGB 11.2 (L) 11/23/2018  HCT 34.1 (L) 11/23/2018   PLT 52 (L) 11/23/2018   LYMPHOPCT 42 06/28/2018   MONOPCT 12 06/28/2018   EOSPCT 3 06/28/2018   BASOPCT 1 06/28/2018    CMP:  Lab Results  Component Value Date   NA 141 11/24/2018   K 3.9 11/24/2018   CL 110 11/24/2018   CO2 21 (L) 11/24/2018   BUN 7 (L) 11/24/2018   CREATININE 0.75 11/24/2018   PROT 7.3 11/23/2018   ALBUMIN 2.1 (L) 11/23/2018   BILITOT 1.1 11/23/2018   ALKPHOS 208 (H) 11/23/2018   AST 100 (H) 11/23/2018   ALT 62 (H) 11/23/2018  .   Total Time in preparing paper work, data evaluation and todays exam - 35 minutes  Huey Bienenstock M.D on 11/26/2018 at 10:38 AM  [facility]   Office  [phone]

## 2018-11-26 NOTE — Discharge Instructions (Signed)
[facility] in [facility] days   Get CBC, CMP, checked  by Primary MD next visit.    Activity: As tolerated with Full fall precautions use walker/cane & assistance as needed   Disposition Home WITH HOSPICE   Diet: Dysphagia 1, carb modified diet, thin liquids, with feeding assistance and aspiration precautions.    On your next visit with your primary care physician please Get Medicines reviewed and adjusted.   Please request your Prim.MD to go over all Hospital Tests and Procedure/Radiological results at the follow up, please get all Hospital records sent to your Prim MD by signing hospital release before you go home.   If you experience worsening of your admission symptoms, develop shortness of breath, life threatening emergency, suicidal or homicidal thoughts you must seek medical attention immediately by calling 911 or calling your MD immediately  if symptoms less severe.  You Must read complete instructions/literature along with all the possible adverse reactions/side effects for all the Medicines you take and that have been prescribed to you. Take any new Medicines after you have completely understood and accpet all the possible adverse reactions/side effects.   Do not drive, operating heavy machinery, perform activities at heights, swimming or participation in water activities or provide baby sitting services if your were admitted for syncope or siezures until you have seen by Primary MD or a Neurologist and advised to do so again.  Do not drive when taking Pain medications.    Do not take more than prescribed Pain, Sleep and Anxiety Medications  Special Instructions: If you have smoked or chewed Tobacco  in the last 2 yrs please stop smoking, stop any regular Alcohol  and or any Recreational drug use.  Wear Seat belts while driving.   Please note  You were cared for by a hospitalist during your hospital stay. If you have any questions  about your discharge medications or the care you received while you were in the hospital after you are discharged, you can call the unit and asked to speak with the hospitalist on call if the hospitalist that took care of you is not available. Once you are discharged, your primary care physician will handle any further medical issues. Please note that NO REFILLS for any discharge medications will be authorized once you are discharged, as it is imperative that you return to your primary care physician (or establish a relationship with a primary care physician if you do not have one) for your aftercare needs so that they can reassess your need for medications and monitor your lab values.

## 2018-11-27 LAB — GLUCOSE, CAPILLARY
Glucose-Capillary: 112 mg/dL — ABNORMAL HIGH (ref 70–99)
Glucose-Capillary: 137 mg/dL — ABNORMAL HIGH (ref 70–99)

## 2018-11-27 MED ORDER — ACETAMINOPHEN 325 MG PO TABS
650.0000 mg | ORAL_TABLET | Freq: Once | ORAL | Status: AC
  Administered 2018-11-27: 650 mg via ORAL
  Filled 2018-11-27: qty 2

## 2018-11-27 NOTE — Progress Notes (Signed)
Diana HaringAda B Hartman to be D/C'd Home per MD order.  Discussed with the patient and all questions fully answered.  VSS, Skin clean, dry and intact without evidence of skin break down, no evidence of skin tears noted. IV catheter discontinued intact. Site without signs and symptoms of complications. Dressing and pressure applied.  An After Visit Summary was printed and given to the patient. Patient received prescription.  D/c education completed with patient/family including follow up instructions, medication list, d/c activities limitations if indicated, with other d/c instructions as indicated by MD - patient able to verbalize understanding, all questions fully answered.   Patient instructed to return to ED, call 911, or call MD for any changes in condition.   Patient escorted via WC, and D/C home via private auto.  [name] 11/27/2018 3:30 PM

## 2018-11-27 NOTE — Progress Notes (Signed)
[facility]  Spoke with Stanton KidneyDebra, daughter, she is on her way from out of town to pick her mother up and take her home.  We had a [facility] RN scheduled to visit her today at 3pm at home.  Dtr states she will be at the hospital around 1pm to pick her mother up.  [facility] will schedule a home RN visit for 12/23.  Thank you, Wallis BambergJennifer Woody BSN, RN [facility]ia[facility] (listed in [facility]) [phone]

## 2018-11-27 NOTE — Discharge Summary (Signed)
Diana Hartman, is a 77 y.o. female  DOB [birth year]  MRN [id number].  Admission date:  11/20/2018  Admitting Physician  Hillary BowJared M Gardner, DO  Discharge Date:  11/27/2018   [facility]  Recommendations for primary care physician for things to follow:   -Other management per hospice -Continue with close monitoring of CBG, encourage oral intake, fluid intake, adjust insulin regimen as needed  Admission Diagnosis  Hypernatremia [E87.0] Hyperglycemia [R73.9] Altered mental status, unspecified altered mental status type [R41.82]   Discharge Diagnosis  Hypernatremia [E87.0] Hyperglycemia [R73.9] Altered mental status, unspecified altered mental status type [R41.82]    Principal Problem:   Acute metabolic encephalopathy Active Problems:   Dementia without behavioral disturbance (HCC)   Hypernatremia   Hyperglycemia without ketosis   Diabetes mellitus type 2 in nonobese (HCC)   Benign essential HTN   Fecal impaction in rectum (HCC)   Goals of care, counseling/discussion   Palliative care by specialist   Encounter for hospice care discussion      Past Medical History:  Diagnosis Date  . Dementia (HCC)   . Diabetes mellitus   . Hypertension   . Stroke (HCC)   . Temporal arteritis [facility](HCC)     Past Surgical History:  Procedure Laterality Date  . NO PAST SURGERIES         History of present illness and  Hospital Course:    (Patient did not go home yesterday since family was not available.  Discharging home today with hospice, changes)  Kindly see H&P for history of present illness and admission details, please review complete Labs, Consult reports and Test reports for all details in brief  HPI  from the history and physical done on the day of admission 11/21/2018  HPI: Diana Hartman is a 77 y.o. female with medical history significant of Dementia, stroke  with R sided deficits, DM2, HTN, temporal arteritis many years ago.  Patient presents to the ED from SNF with report of decreased LOC and interaction throughout the day today.  Daughter reports decreased PO intake over last couple of days.  Reports that patient does have a baseline history of dementia but states she is usually alert and will respond to questions. They state her blood sugars have been slightly elevated and reports that yesterday they are in the 400s. Patient has not been complaining of any pain and has not any vomiting or fever.   Hospital Course  77 y.o.femalew/ a hx ofDementia, stroke with R sided deficits, DM2, HTN, and temporal arteritis many years ago who presented from her SNFwith decreased LOC. Daughter reported decreased PO intake over a couple of days.  In the ED she was found to have a glucose of 936, sodium 151, Creat 1.4 up from 0.7 baseline. CT head notednothing acute. CT abd/pelvis noted fecal impaction in rectum but no stercoral colitis.   Acute metabolic encephalopathy - Appears To be multifactorial, significant baseline dementia  -Some acute factors contributing, elevated ammonia levels, HONK, and hypernatremia  -  B12 and folate not low  -Continue with lactulose, which was started for elevated ammonia level  HONK -CBGs are significantly uncontrolled, is on dysphagia 1 diet, carb modified, her insulin regimen has been adjusted, to continue glipizide, her Lantus has been increased to 26 unit BID, so she will be discharged on sliding scale .  Hypernatremia -Resolved   Hypophosphatemia   Demented orally during hospital stay  Hypokalemia Mg is normal -repleted  AKI crt has improved w/ simple volume expansion, resolved  Dementia - chronic  Stool impaction  Milk and Molasses enema ordered x1 - appears to have resolve in ED   HTN BP reasonably controlled and stable , Cardizem and lisinopril has been held on  discharge  Transaminitis Trending down  History of nonhemorrhagic CVA -Continue with aspirin and Plavix  Discharge Condition:  Stable - discharged to hospice, I have informed family is high risk for recurrent readmission, given her advanced dementia, and failure to thrive and extreme frailty  Follow UP  Follow-up Information    [facility] Follow up.   Why:  home hospice car arranged Contact information: [address] [place] [phone]            Discharge Instructions  and  Discharge Medications    Discharge Instructions    Activity as tolerated - No restrictions   Complete by:  As directed    Diet - low sodium heart healthy   Complete by:  As directed    Discharge instructions   Complete by:  As directed    [facility]   Get CBC, CMP, checked  by Primary MD next visit.    Activity: As tolerated with Full fall precautions use walker/cane & assistance as needed   Disposition Home WITH HOSPICE   Diet: Dysphagia 1, carb modified diet, thin liquids, with feeding assistance and aspiration precautions.    On your next visit with your primary care physician please Get Medicines reviewed and adjusted.   Please request your Prim.MD to go over all Hospital Tests and Procedure/Radiological results at the follow up, please get all Hospital records sent to your Prim MD by signing hospital release before you go home.   If you experience worsening of your admission symptoms, develop shortness of breath, life threatening emergency, suicidal or homicidal thoughts you must seek medical attention immediately by calling 911 or calling your MD immediately  if symptoms less severe.  You Must read complete instructions/literature along with all the possible adverse reactions/side effects for all the Medicines you take and that have been prescribed to you. Take any  new Medicines after you have completely understood and accpet all the possible adverse reactions/side effects.   Do not drive, operating heavy machinery, perform activities at heights, swimming or participation in water activities or provide baby sitting services if your were admitted for syncope or siezures until you have seen by Primary MD or a Neurologist and advised to do so again.  Do not drive when taking Pain medications.    Do not take more than prescribed Pain, Sleep and Anxiety Medications  Special Instructions: If you have smoked or chewed Tobacco  in the last 2 yrs please stop smoking, stop any regular Alcohol  and or any Recreational drug use.  Wear Seat belts while driving.   Please note  You were cared for by a hospitalist during your hospital stay. If you have any questions about  your discharge medications or the care you received while you were in the hospital after you are discharged, you can call the unit and asked to speak with the hospitalist on call if the hospitalist that took care of you is not available. Once you are discharged, your primary care physician will handle any further medical issues. Please note that NO REFILLS for any discharge medications will be authorized once you are discharged, as it is imperative that you return to your primary care physician (or establish a relationship with a primary care physician if you do not have one) for your aftercare needs so that they can reassess your need for medications and monitor your lab values.   Discharge instructions   Complete by:  As directed    As before   Increase activity slowly   Complete by:  As directed    Increase activity slowly   Complete by:  As directed      Allergies as of 11/27/2018   No Known Allergies     Medication List    STOP taking these medications   diltiazem 120 MG tablet Commonly known as:  CARDIZEM   lisinopril 40 MG tablet Commonly known as:  PRINIVIL,ZESTRIL   zolpidem 10  MG tablet Commonly known as:  AMBIEN     TAKE these medications   aspirin 81 MG EC tablet Take 1 tablet (81 mg total) by mouth daily.   atorvastatin 80 MG tablet Commonly known as:  LIPITOR Take 1 tablet (80 mg total) by mouth daily at 6 PM.   CALCIUM 600+D 600-200 MG-UNIT Tabs Generic drug:  Calcium Carbonate-Vitamin D Take 1 tablet by mouth daily.   clopidogrel 75 MG tablet Commonly known as:  PLAVIX Take 1 tablet (75 mg total) by mouth daily.   donepezil 10 MG tablet Commonly known as:  ARICEPT Take 10 mg by mouth daily.   glipiZIDE 10 MG tablet Commonly known as:  GLUCOTROL Take 10 mg by mouth daily.   GLUCERNA Liqd Take 237 mLs by mouth 2 (two) times daily between meals. What changed:    when to take this  reasons to take this   insulin detemir 100 UNIT/ML injection Commonly known as:  LEVEMIR Inject 0.26 mLs (26 Units total) into the skin 2 (two) times daily. What changed:  how much to take   insulin lispro 100 UNIT/ML injection Commonly known as:  HUMALOG Use insulin as sliding scale before meals 3 times daily -CBG 150-200, give 2 units  -CBG 201 to 250 give 4 units  -CBG 251-300 give 6 units -CBG 301 to 350 give 8 units -CBG 351-400 give 10 units -CBG 400-450 give 14 units   lactulose 10 GM/15ML solution Commonly known as:  CHRONULAC Take 15 mLs (10 g total) by mouth 2 (two) times daily.   multivitamin tablet Take 1 tablet by mouth daily.   nebivolol 5 MG tablet Commonly known as:  BYSTOLIC Take 5 mg by mouth daily.   sertraline 50 MG tablet Commonly known as:  ZOLOFT Take 50 mg by mouth at bedtime.         Diet and Activity recommendation: See Discharge Instructions above   Consults obtained - Palliative   Major procedures and Radiology Reports - PLEASE review detailed and final reports for all details, in brief -      Ct Head Wo Contrast  Result Date: 11/20/2018 CLINICAL DATA:  Altered mental status.  Hyperglycemic. EXAM:  CT HEAD WITHOUT CONTRAST TECHNIQUE: Contiguous axial images were  obtained from the base of the skull through the vertex without intravenous contrast. COMPARISON:  MRI of the head June 28, 2018 FINDINGS: BRAIN: No intraparenchymal hemorrhage, mass effect nor midline shift. Severe parenchymal brain volume loss most notable within mesial occipital lobes. No hydrocephalus. Old LEFT cerebellar infarct. Bifrontal encephalomalacia. Patchy supratentorial white matter hypodensities less than expected for patient's age, though non-specific are most compatible with chronic small vessel ischemic disease. No acute large vascular territory infarcts. No abnormal extra-axial fluid collections. Basal cisterns are patent. VASCULAR: Moderate calcific atherosclerosis of the carotid siphons. SKULL: No skull fracture. Borderline empty sella. No significant scalp soft tissue swelling. SINUSES/ORBITS: Trace paranasal sinus mucosal thickening. Mastoid air cells are well aerated.The included ocular globes and orbital contents are non-suspicious. Status post bilateral ocular lens implants. OTHER: None. IMPRESSION: 1. No acute intracranial process. 2. Severe parenchymal brain volume loss most notable within mesial temporal lobes associated with neurodegenerative syndromes. 3. Old bifrontal/MCA territory infarcts. Old small LEFT cerebellar infarct. Electronically Signed   By: Awilda Metro M.D.   On: 11/20/2018 22:57   Ct Abdomen Pelvis W Contrast  Result Date: 11/20/2018 CLINICAL DATA:  Abdominal pain. EXAM: CT ABDOMEN AND PELVIS WITH CONTRAST TECHNIQUE: Multidetector CT imaging of the abdomen and pelvis was performed using the standard protocol following bolus administration of intravenous contrast. CONTRAST:  OMNIPAQUE IOHEXOL 300 MG/ML  SOLN COMPARISON:  None. FINDINGS: Lower chest: No acute abnormality. Subsegmental atelectasis in the right lower lobe. Hepatobiliary: No focal liver abnormality is seen. No gallstones,  gallbladder wall thickening, or biliary dilatation. Pancreas: Unremarkable. No pancreatic ductal dilatation or surrounding inflammatory changes. Spleen: Normal in size without focal abnormality. Adrenals/Urinary Tract: The adrenal glands are unremarkable. Small bilateral renal cysts. Other subcentimeter low-density lesions in both kidneys are too small to characterize. No renal calculi or hydronephrosis. Mild circumferential bladder wall thickening. Stomach/Bowel: Stomach is within normal limits. Appendix appears normal. No evidence of bowel wall thickening, distention, or inflammatory changes. Large stool ball in the rectum. Vascular/Lymphatic: Aortic atherosclerosis. No enlarged abdominal or pelvic lymph nodes. Reproductive: Small amount of fluid and air within the endometrial canal. No adnexal mass. Other: Right paraumbilical hernia containing fat and non-dilated transverse colon. No free fluid or pneumoperitoneum. Musculoskeletal: No acute or significant osseous findings. Severe degenerative disc disease of the lower lumbar spine with grade 1 anterolisthesis at L3-L4. IMPRESSION: 1. Large stool ball in the rectum. Correlate for fecal impaction. No significant surrounding inflammatory changes to suggest stercoral colitis. 2. Mild circumferential bladder wall thickening. Correlate with urinalysis to exclude cystitis. 3. Right paraumbilical hernia containing fat and non-dilated transverse colon. 4. Small amount of fluid and air within the endometrial canal. Correlate with any history of recent pelvic exam and for signs or symptoms of endometritis. 5.  Aortic atherosclerosis (ICD10-I70.0). Electronically Signed   By: Obie Dredge M.D.   On: 11/20/2018 23:12    Micro Results     No results found for this or any previous visit (from the past 240 hour(s)).     Today   Subjective:   Diana Hartman today patient unable to provide any complaints, no significant events per staff Objective:   Blood  pressure 139/66, pulse 79, temperature (!) 100.5 F (38.1 C), temperature source Oral, resp. rate 18, SpO2 98 %.   Intake/Output Summary (Last 24 hours) at 11/27/2018 1113 Last data filed at 11/27/2018 0946 Gross per 24 hour  Intake 110 ml  Output -  Net 110 ml    Exam  Awake,  frail, chronically ill-appearing Supple Neck,No JVD, No cervical lymphadenopathy appriciated.  Symmetrical Chest wall movement, Good air movement bilaterally, CTAB RRR,No Gallops,Rubs or new Murmurs, No Parasternal Heave +ve B.Sounds, Abd Soft, Non tender,  No rebound -guarding or rigidity. No Cyanosis, Clubbing or edema, No new Rash or bruise  Data Review   CBC w Diff:  Lab Results  Component Value Date   WBC 10.0 11/23/2018   HGB 11.2 (L) 11/23/2018   HCT 34.1 (L) 11/23/2018   PLT 52 (L) 11/23/2018   LYMPHOPCT 42 06/28/2018   MONOPCT 12 06/28/2018   EOSPCT 3 06/28/2018   BASOPCT 1 06/28/2018    CMP:  Lab Results  Component Value Date   NA 141 11/24/2018   K 3.9 11/24/2018   CL 110 11/24/2018   CO2 21 (L) 11/24/2018   BUN 7 (L) 11/24/2018   CREATININE 0.75 11/24/2018   PROT 7.3 11/23/2018   ALBUMIN 2.1 (L) 11/23/2018   BILITOT 1.1 11/23/2018   ALKPHOS 208 (H) 11/23/2018   AST 100 (H) 11/23/2018   ALT 62 (H) 11/23/2018  .   Total Time in preparing paper work, data evaluation and todays exam - 35 minutes  Kendell Bane M.D on 11/27/2018 at 11:13 AM  [facility]   Office  [phone]

## 2018-11-28 DIAGNOSIS — W19XXXA Unspecified fall, initial encounter: Secondary | ICD-10-CM | POA: Diagnosis not present

## 2018-11-28 DIAGNOSIS — I6340 Cerebral infarction due to embolism of unspecified cerebral artery: Secondary | ICD-10-CM | POA: Diagnosis not present

## 2018-12-05 ENCOUNTER — Telehealth: Payer: Self-pay

## 2018-12-05 NOTE — Telephone Encounter (Signed)
Rn receive fax that pt was admitted to hospice care on 11/29/2018. Fax letter given to Shanda BumpsJessica NP. Pt has appt in January 2020. Rn will notify Shanda BumpsJessica NP if appt next month is needed or can be cancel.

## 2018-12-08 NOTE — Telephone Encounter (Addendum)
RN call pts daughter [name] about her moms appt being cancel. Rn stated a fax was sent saying patient was admitted to hospice care. The daughter stated her mom is at home receiving hospice care. Rn stated per [name] the appt with her can be cancel due to pt being under hospice care. Stanton Kidney verbalized understanding of appt being cancel.

## 2018-12-08 NOTE — Telephone Encounter (Signed)
Revised. 

## 2018-12-08 NOTE — Telephone Encounter (Signed)
Patient had recent hospital admission and is currently receiving home hospice.  Per notes, she does have a high risk of readmission given her advanced dementia, failure to thrive and extreme frailty.  Diana Hartman does have an appointment scheduled in our office on 01/04/2019 but due to her currently being on hospice, we can hold off on appointment at this time as it is most likely difficult to transfer patient causing her more stress and pain.  Please call daughter to ensure she agrees with this and to see if she has any questions regarding her prior stroke. Thank you.

## 2019-01-04 ENCOUNTER — Ambulatory Visit: Payer: Medicare Other | Admitting: Adult Health

## 2019-05-13 DIAGNOSIS — R0902 Hypoxemia: Secondary | ICD-10-CM | POA: Diagnosis not present

## 2019-05-13 DIAGNOSIS — R404 Transient alteration of awareness: Secondary | ICD-10-CM | POA: Diagnosis not present

## 2019-05-13 DIAGNOSIS — R001 Bradycardia, unspecified: Secondary | ICD-10-CM | POA: Diagnosis not present

## 2019-05-13 DIAGNOSIS — E162 Hypoglycemia, unspecified: Secondary | ICD-10-CM | POA: Diagnosis not present

## 2019-05-13 DIAGNOSIS — E161 Other hypoglycemia: Secondary | ICD-10-CM | POA: Diagnosis not present

## 2019-09-26 DIAGNOSIS — I69951 Hemiplegia and hemiparesis following unspecified cerebrovascular disease affecting right dominant side: Secondary | ICD-10-CM | POA: Diagnosis not present

## 2019-09-26 DIAGNOSIS — I10 Essential (primary) hypertension: Secondary | ICD-10-CM | POA: Diagnosis not present

## 2019-09-26 DIAGNOSIS — G309 Alzheimer's disease, unspecified: Secondary | ICD-10-CM | POA: Diagnosis not present

## 2020-04-16 ENCOUNTER — Telehealth: Payer: Self-pay | Admitting: Physician Assistant

## 2020-04-16 NOTE — Telephone Encounter (Signed)
I connected by phone with Diana Hartman and/or patient's caregiver on 04/16/2020 at 4:57 PM to discuss the potential vaccination through our Homebound vaccination initiative.   Prevaccination Checklist for COVID-19 Vaccines  1.  Are you feeling sick today? no  2.  Have you ever received a dose of a COVID-19 vaccine?  no      If yes, which one? None   3.  Have you ever had an allergic reaction: (This would include a severe reaction [ e.g., anaphylaxis] that required treatment with epinephrine or EpiPen or that caused you to go to the hospital.  It would also include an allergic reaction that occurred within 4 hours that caused hives, swelling, or respiratory distress, including wheezing.) A.  A previous dose of COVID-19 vaccine. no  B.  A vaccine or injectable therapy that contains multiple components, one of which is a COVID-19 vaccine component, but it is not known which component elicited the immediate reaction. no  C.  Are you allergic to polyethylene glycol? no   4.  Have you ever had an allergic reaction to another vaccine (other than COVID-19 vaccine) or an injectable medication? (This would include a severe reaction [ e.g., anaphylaxis] that required treatment with epinephrine or EpiPen or that caused you to go to the hospital.  It would also include an allergic reaction that occurred within 4 hours that caused hives, swelling, or respiratory distress, including wheezing.)  no   5.  Have you ever had a severe allergic reaction (e.g., anaphylaxis) to something other than a component of the COVID-19 vaccine, or any vaccine or injectable medication?  This would include food, pet, venom, environmental, or oral medication allergies.  no   6.  Have you received any vaccine in the last 14 days? no   7.  Have you ever had a positive test for COVID-19 or has a doctor ever told you that you had COVID-19?  no   8.  Have you received passive antibody therapy (monoclonal antibodies or convalescent  serum) as a treatment for COVID-19? no   9.  Do you have a weakened immune system caused by something such as HIV infection or cancer or do you take immunosuppressive drugs or therapies?  no   10.  Do you have a bleeding disorder or are you taking a blood thinner? No, Previously on Plavix (but no longer taking although still on med list)  11.  Are you pregnant or breast-feeding? no   12.  Do you have dermal fillers? no   __________________   This patient is a 79 y.o. female that meets the FDA criteria to receive homebound vaccination. Patient or parent/caregiver understands they have the option to accept or refuse homebound vaccination.  Patient passed the pre-screening checklist and would like to proceed with homebound vaccination.  Based on questionnaire above, I recommend the patient be observed for 15 minutes.  There are an estimated 1 other household members/caregivers who are also interested in receiving the vaccine.    I will send the patient's information to our scheduling team who will reach out to schedule the patient and potential caregiver/family members for homebound vaccination.    Cline Crock PA-C 04/16/2020 4:57 PM

## 2020-05-07 ENCOUNTER — Ambulatory Visit: Payer: Medicare Other

## 2020-05-30 ENCOUNTER — Ambulatory Visit: Payer: Medicare Other | Attending: Critical Care Medicine

## 2020-05-30 DIAGNOSIS — Z23 Encounter for immunization: Secondary | ICD-10-CM

## 2020-05-30 NOTE — Progress Notes (Signed)
   Covid-19 Vaccination Clinic  Name:  Diana Hartman    MRN: [id number] DOB: 04/28/1941  05/30/2020  [name] was observed post Covid-19 immunization for15 minutes without incident. She was provided with Vaccine Information Sheet and instruction to access the V-Safe system.   [name] was instructed to call 911 with any severe reactions post vaccine: Marland Kitchen Difficulty breathing  . Swelling of face and throat  . A fast heartbeat  . A bad rash all over body  . Dizziness and weakness   Immunizations Administered    Name Date Dose VIS Date Route   Moderna COVID-19 Vaccine 05/30/2020  2:05 PM 0.5 mL 11/2019 Intramuscular   Manufacturer: Moderna   Lot: [id number]   NDC: 11031-594-58

## 2020-06-04 ENCOUNTER — Ambulatory Visit: Payer: Medicare Other

## 2020-06-27 ENCOUNTER — Ambulatory Visit: Payer: Medicare Other | Attending: Critical Care Medicine

## 2020-06-27 DIAGNOSIS — Z23 Encounter for immunization: Secondary | ICD-10-CM

## 2020-06-27 NOTE — Progress Notes (Signed)
   Covid-19 Vaccination Clinic  Name:  Diana Hartman    MRN: [id number] DOB: Jun 22, 1941  06/27/2020  [name] was observed post Covid-19 immunization for 15 minutes  without incident. She was provided with Vaccine Information Sheet and instruction to access the V-Safe system.   [name] was instructed to call 911 with any severe reactions post vaccine: Marland Kitchen Difficulty breathing  . Swelling of face and throat  . A fast heartbeat  . A bad rash all over body  . Dizziness and weakness   Immunizations Administered    Name Date Dose VIS Date Route   Moderna COVID-19 Vaccine 06/27/2020  1:14 PM 0.5 mL 11/2019 Intramuscular   Manufacturer: Moderna   Lot: [id number]   NDC: 31594-585-92

## 2020-08-29 MED FILL — ADMELOG SOLOSTAR 100 UNIT/M: 100 | 30 days supply | Qty: 12 | Fill #0

## 2020-09-24 MED FILL — NEBIVOLOL HCL 5 MG TABS: 5 | 30 days supply | Qty: 30 | Fill #0

## 2020-10-15 ENCOUNTER — Ambulatory Visit: Payer: Medicare Other

## 2020-10-18 ENCOUNTER — Ambulatory Visit: Payer: Medicare Other

## 2020-11-01 MED FILL — NEBIVOLOL HCL 5 MG TABS: 5 | 30 days supply | Qty: 30 | Fill #1

## 2020-12-02 MED FILL — NEBIVOLOL HCL 5 MG TABS: 5 | 30 days supply | Qty: 30 | Fill #2

## 2021-01-01 ENCOUNTER — Ambulatory Visit: Payer: Medicare Other | Attending: Internal Medicine

## 2021-01-01 ENCOUNTER — Other Ambulatory Visit: Payer: Self-pay

## 2021-01-01 DIAGNOSIS — Z23 Encounter for immunization: Secondary | ICD-10-CM

## 2021-01-01 NOTE — Progress Notes (Signed)
   Covid-19 Vaccination Clinic  Name:  GENELL THEDE    MRN: [id number] DOB: 1941-10-31  01/01/2021  Ms. Kneip was observed post Covid-19 immunization for 15 minutes without incident. She was provided with Vaccine Information Sheet and instruction to access the V-Safe system.   Ms. Ceballos was instructed to call 911 with any severe reactions post vaccine: Marland Kitchen Difficulty breathing  . Swelling of face and throat  . A fast heartbeat  . A bad rash all over body  . Dizziness and weakness   Immunizations Administered    Name Date Dose VIS Date Route   Moderna Covid-19 Booster Vaccine 01/01/2021 10:40 AM 0.25 mL 09/25/2020 Intramuscular   Manufacturer: Moderna   Lot: [id number]   NDC: 28638-177-11

## 2021-01-03 DIAGNOSIS — W19XXXA Unspecified fall, initial encounter: Secondary | ICD-10-CM | POA: Diagnosis not present

## 2021-01-03 DIAGNOSIS — I6340 Cerebral infarction due to embolism of unspecified cerebral artery: Secondary | ICD-10-CM | POA: Diagnosis not present

## 2021-01-03 DIAGNOSIS — N390 Urinary tract infection, site not specified: Secondary | ICD-10-CM | POA: Diagnosis not present

## 2021-01-07 ENCOUNTER — Other Ambulatory Visit (HOSPITAL_COMMUNITY): Payer: Self-pay | Admitting: Internal Medicine

## 2021-01-07 MED FILL — NEBIVOLOL HCL 5 MG TABS: 5 | 30 days supply | Qty: 30 | Fill #0

## 2021-01-30 ENCOUNTER — Other Ambulatory Visit (HOSPITAL_COMMUNITY): Payer: Self-pay | Admitting: Internal Medicine

## 2021-02-03 DIAGNOSIS — N390 Urinary tract infection, site not specified: Secondary | ICD-10-CM | POA: Diagnosis not present

## 2021-02-03 DIAGNOSIS — I6340 Cerebral infarction due to embolism of unspecified cerebral artery: Secondary | ICD-10-CM | POA: Diagnosis not present

## 2021-02-03 DIAGNOSIS — W19XXXA Unspecified fall, initial encounter: Secondary | ICD-10-CM | POA: Diagnosis not present

## 2021-02-14 MED FILL — NEBIVOLOL HCL 5 MG TABS: 5 | 30 days supply | Qty: 30 | Fill #1

## 2021-02-14 MED FILL — INSULIN GLARGINE-YFGN 100 U: 100 | 25 days supply | Qty: 3 | Fill #0

## 2021-02-27 MED FILL — INSULIN GLARGINE-YFGN 100 U: 100 | 25 days supply | Qty: 3 | Fill #0

## 2021-03-03 DIAGNOSIS — N390 Urinary tract infection, site not specified: Secondary | ICD-10-CM | POA: Diagnosis not present

## 2021-03-03 DIAGNOSIS — I6340 Cerebral infarction due to embolism of unspecified cerebral artery: Secondary | ICD-10-CM | POA: Diagnosis not present

## 2021-03-03 DIAGNOSIS — W19XXXA Unspecified fall, initial encounter: Secondary | ICD-10-CM | POA: Diagnosis not present

## 2021-03-13 ENCOUNTER — Other Ambulatory Visit (HOSPITAL_COMMUNITY): Payer: Self-pay

## 2021-03-13 MED ORDER — INSULIN LISPRO (1 UNIT DIAL) 100 UNIT/ML (KWIKPEN)
PEN_INJECTOR | SUBCUTANEOUS | 24 refills | Status: DC
  Filled 2021-03-13: qty 12, 30d supply, fill #0
  Filled 2021-07-22: qty 12, 30d supply, fill #1

## 2021-03-13 MED FILL — Insulin Glargine-yfgn Soln Pen-Injector 100 Unit/ML: SUBCUTANEOUS | 25 days supply | Qty: 3 | Fill #0 | Status: AC

## 2021-03-24 ENCOUNTER — Other Ambulatory Visit (HOSPITAL_COMMUNITY): Payer: Self-pay

## 2021-03-24 MED FILL — Nebivolol HCl Tab 5 MG (Base Equivalent): ORAL | 30 days supply | Qty: 30 | Fill #0 | Status: AC

## 2021-04-03 DIAGNOSIS — I6340 Cerebral infarction due to embolism of unspecified cerebral artery: Secondary | ICD-10-CM | POA: Diagnosis not present

## 2021-04-03 DIAGNOSIS — N390 Urinary tract infection, site not specified: Secondary | ICD-10-CM | POA: Diagnosis not present

## 2021-04-03 DIAGNOSIS — W19XXXA Unspecified fall, initial encounter: Secondary | ICD-10-CM | POA: Diagnosis not present

## 2021-04-15 ENCOUNTER — Other Ambulatory Visit (HOSPITAL_COMMUNITY): Payer: Self-pay

## 2021-04-15 MED ORDER — NEBIVOLOL HCL 5 MG PO TABS
5.0000 mg | ORAL_TABLET | Freq: Every day | ORAL | 1 refills | Status: DC
  Filled 2021-04-15 – 2021-04-25 (×2): qty 30, 30d supply, fill #0
  Filled 2021-05-26: qty 30, 30d supply, fill #1

## 2021-04-15 MED ORDER — INSULIN GLARGINE-YFGN 100 UNIT/ML ~~LOC~~ SOPN
12.0000 [IU] | PEN_INJECTOR | Freq: Every morning | SUBCUTANEOUS | 1 refills | Status: DC
  Filled 2021-04-15 – 2021-04-28 (×2): qty 3, 25d supply, fill #0
  Filled 2021-05-16: qty 3, 25d supply, fill #1

## 2021-04-23 ENCOUNTER — Other Ambulatory Visit (HOSPITAL_COMMUNITY): Payer: Self-pay

## 2021-04-25 ENCOUNTER — Other Ambulatory Visit (HOSPITAL_COMMUNITY): Payer: Self-pay

## 2021-04-28 ENCOUNTER — Other Ambulatory Visit (HOSPITAL_COMMUNITY): Payer: Self-pay

## 2021-05-03 DIAGNOSIS — N390 Urinary tract infection, site not specified: Secondary | ICD-10-CM | POA: Diagnosis not present

## 2021-05-03 DIAGNOSIS — I6340 Cerebral infarction due to embolism of unspecified cerebral artery: Secondary | ICD-10-CM | POA: Diagnosis not present

## 2021-05-03 DIAGNOSIS — W19XXXA Unspecified fall, initial encounter: Secondary | ICD-10-CM | POA: Diagnosis not present

## 2021-05-16 ENCOUNTER — Other Ambulatory Visit (HOSPITAL_COMMUNITY): Payer: Self-pay

## 2021-05-21 ENCOUNTER — Other Ambulatory Visit (HOSPITAL_COMMUNITY): Payer: Self-pay

## 2021-05-21 MED ORDER — INSULIN GLARGINE-YFGN 100 UNIT/ML ~~LOC~~ SOPN
PEN_INJECTOR | Freq: Every morning | SUBCUTANEOUS | 3 refills | Status: DC
  Filled 2021-05-21: qty 3, 25d supply, fill #0
  Filled 2021-06-03 (×2): qty 3, 25d supply, fill #1

## 2021-05-26 ENCOUNTER — Other Ambulatory Visit (HOSPITAL_COMMUNITY): Payer: Self-pay

## 2021-06-03 ENCOUNTER — Other Ambulatory Visit (HOSPITAL_COMMUNITY): Payer: Self-pay

## 2021-06-03 DIAGNOSIS — N390 Urinary tract infection, site not specified: Secondary | ICD-10-CM | POA: Diagnosis not present

## 2021-06-03 DIAGNOSIS — I6340 Cerebral infarction due to embolism of unspecified cerebral artery: Secondary | ICD-10-CM | POA: Diagnosis not present

## 2021-06-03 DIAGNOSIS — W19XXXA Unspecified fall, initial encounter: Secondary | ICD-10-CM | POA: Diagnosis not present

## 2021-06-11 ENCOUNTER — Other Ambulatory Visit (HOSPITAL_COMMUNITY): Payer: Self-pay

## 2021-06-11 MED ORDER — INSULIN GLARGINE-YFGN 100 UNIT/ML ~~LOC~~ SOPN
PEN_INJECTOR | SUBCUTANEOUS | 12 refills | Status: DC
  Filled 2021-06-11: qty 3, 16d supply, fill #0
  Filled 2021-06-26: qty 3, 17d supply, fill #0
  Filled 2021-07-22: qty 3, 17d supply, fill #1
  Filled 2021-08-26: qty 3, 17d supply, fill #2
  Filled 2021-09-09: qty 3, 17d supply, fill #3
  Filled 2021-10-06: qty 3, 17d supply, fill #4
  Filled 2021-10-21: qty 3, 17d supply, fill #5
  Filled 2021-11-18 – 2021-11-21 (×2): qty 3, 17d supply, fill #6
  Filled 2021-12-12: qty 3, 17d supply, fill #7
  Filled 2022-01-06: qty 3, 17d supply, fill #8
  Filled 2022-02-02: qty 3, 17d supply, fill #9
  Filled 2022-02-24: qty 3, 17d supply, fill #10
  Filled 2022-03-16: qty 3, 17d supply, fill #11
  Filled 2022-04-03: qty 3, 17d supply, fill #12

## 2021-06-24 ENCOUNTER — Other Ambulatory Visit (HOSPITAL_COMMUNITY): Payer: Self-pay

## 2021-06-24 MED ORDER — NEBIVOLOL HCL 5 MG PO TABS
5.0000 mg | ORAL_TABLET | Freq: Every day | ORAL | 2 refills | Status: AC
  Filled 2021-06-24: qty 30, 30d supply, fill #0
  Filled 2021-07-22: qty 30, 30d supply, fill #1
  Filled 2021-08-26: qty 30, 30d supply, fill #2

## 2021-06-26 ENCOUNTER — Other Ambulatory Visit (HOSPITAL_COMMUNITY): Payer: Self-pay

## 2021-07-22 ENCOUNTER — Other Ambulatory Visit (HOSPITAL_COMMUNITY): Payer: Self-pay

## 2021-08-26 ENCOUNTER — Other Ambulatory Visit (HOSPITAL_COMMUNITY): Payer: Self-pay

## 2021-08-27 ENCOUNTER — Other Ambulatory Visit (HOSPITAL_COMMUNITY): Payer: Self-pay

## 2021-09-09 ENCOUNTER — Other Ambulatory Visit (HOSPITAL_COMMUNITY): Payer: Self-pay

## 2021-10-06 ENCOUNTER — Other Ambulatory Visit (HOSPITAL_COMMUNITY): Payer: Self-pay

## 2021-10-21 ENCOUNTER — Other Ambulatory Visit (HOSPITAL_COMMUNITY): Payer: Self-pay

## 2021-10-31 ENCOUNTER — Other Ambulatory Visit (HOSPITAL_COMMUNITY): Payer: Self-pay

## 2021-11-03 ENCOUNTER — Other Ambulatory Visit (HOSPITAL_COMMUNITY): Payer: Self-pay

## 2021-11-18 ENCOUNTER — Other Ambulatory Visit (HOSPITAL_COMMUNITY): Payer: Self-pay

## 2021-11-20 ENCOUNTER — Other Ambulatory Visit (HOSPITAL_COMMUNITY): Payer: Self-pay

## 2021-11-21 ENCOUNTER — Other Ambulatory Visit (HOSPITAL_COMMUNITY): Payer: Self-pay

## 2021-11-21 MED ORDER — INSULIN GLARGINE 100 UNIT/ML ~~LOC~~ SOLN
12.0000 [IU] | Freq: Every morning | SUBCUTANEOUS | 0 refills | Status: DC
  Filled 2021-11-21: qty 10, 83d supply, fill #0

## 2021-11-21 MED ORDER — INSULIN LISPRO (1 UNIT DIAL) 100 UNIT/ML (KWIKPEN)
PEN_INJECTOR | SUBCUTANEOUS | 2 refills | Status: AC
  Filled 2021-11-21: qty 12, 30d supply, fill #0
  Filled 2021-12-12 – 2022-02-02 (×2): qty 12, 30d supply, fill #1
  Filled 2022-02-24 – 2022-04-16 (×2): qty 12, 30d supply, fill #2

## 2021-12-12 ENCOUNTER — Other Ambulatory Visit (HOSPITAL_COMMUNITY): Payer: Self-pay

## 2021-12-15 ENCOUNTER — Other Ambulatory Visit (HOSPITAL_COMMUNITY): Payer: Self-pay

## 2021-12-23 ENCOUNTER — Other Ambulatory Visit (HOSPITAL_COMMUNITY): Payer: Self-pay

## 2022-01-06 ENCOUNTER — Other Ambulatory Visit (HOSPITAL_COMMUNITY): Payer: Self-pay

## 2022-02-02 ENCOUNTER — Other Ambulatory Visit (HOSPITAL_COMMUNITY): Payer: Self-pay

## 2022-02-24 ENCOUNTER — Other Ambulatory Visit (HOSPITAL_COMMUNITY): Payer: Self-pay

## 2022-02-25 ENCOUNTER — Other Ambulatory Visit (HOSPITAL_COMMUNITY): Payer: Self-pay

## 2022-03-09 ENCOUNTER — Other Ambulatory Visit (HOSPITAL_COMMUNITY): Payer: Self-pay

## 2022-03-11 ENCOUNTER — Other Ambulatory Visit (HOSPITAL_COMMUNITY): Payer: Self-pay

## 2022-03-16 ENCOUNTER — Other Ambulatory Visit (HOSPITAL_COMMUNITY): Payer: Self-pay

## 2022-04-03 ENCOUNTER — Other Ambulatory Visit (HOSPITAL_COMMUNITY): Payer: Self-pay

## 2022-04-16 ENCOUNTER — Other Ambulatory Visit (HOSPITAL_COMMUNITY): Payer: Self-pay

## 2022-04-17 ENCOUNTER — Other Ambulatory Visit (HOSPITAL_COMMUNITY): Payer: Self-pay

## 2022-04-17 MED ORDER — INSULIN GLARGINE-YFGN 100 UNIT/ML ~~LOC~~ SOPN
18.0000 [IU] | PEN_INJECTOR | Freq: Every day | SUBCUTANEOUS | 2 refills | Status: AC
  Filled 2022-04-17: qty 3, 16d supply, fill #0
  Filled 2022-05-05: qty 3, 16d supply, fill #1
  Filled 2022-05-26: qty 3, 16d supply, fill #2

## 2022-05-05 ENCOUNTER — Other Ambulatory Visit (HOSPITAL_COMMUNITY): Payer: Self-pay

## 2022-05-26 ENCOUNTER — Other Ambulatory Visit (HOSPITAL_COMMUNITY): Payer: Self-pay

## 2022-06-16 ENCOUNTER — Other Ambulatory Visit (HOSPITAL_COMMUNITY): Payer: Self-pay

## 2022-06-18 ENCOUNTER — Other Ambulatory Visit (HOSPITAL_COMMUNITY): Payer: Self-pay

## 2022-06-19 ENCOUNTER — Other Ambulatory Visit (HOSPITAL_COMMUNITY): Payer: Self-pay

## 2022-06-23 ENCOUNTER — Other Ambulatory Visit (HOSPITAL_COMMUNITY): Payer: Self-pay

## 2022-06-26 ENCOUNTER — Other Ambulatory Visit (HOSPITAL_COMMUNITY): Payer: Self-pay

## 2022-07-02 ENCOUNTER — Other Ambulatory Visit (HOSPITAL_COMMUNITY): Payer: Self-pay

## 2022-10-05 ENCOUNTER — Other Ambulatory Visit (HOSPITAL_COMMUNITY): Payer: Self-pay

## 2023-04-07 DEATH — deceased
